# Patient Record
Sex: Female | Born: 1990
Health system: Southern US, Community
[De-identification: ages and names within clinical notes are randomized; demographics above are authoritative.]

## PROBLEM LIST (undated history)

## (undated) ENCOUNTER — Ambulatory Visit (HOSPITAL_COMMUNITY): Admission: EM | Payer: BLUE CROSS/BLUE SHIELD | Source: Home / Self Care

## (undated) ENCOUNTER — Inpatient Hospital Stay (HOSPITAL_COMMUNITY): Payer: Self-pay

## (undated) DIAGNOSIS — F419 Anxiety disorder, unspecified: Secondary | ICD-10-CM

## (undated) DIAGNOSIS — IMO0002 Reserved for concepts with insufficient information to code with codable children: Secondary | ICD-10-CM

## (undated) DIAGNOSIS — R87629 Unspecified abnormal cytological findings in specimens from vagina: Secondary | ICD-10-CM

## (undated) DIAGNOSIS — D649 Anemia, unspecified: Secondary | ICD-10-CM

## (undated) DIAGNOSIS — R569 Unspecified convulsions: Secondary | ICD-10-CM

## (undated) DIAGNOSIS — E7030 Albinism, unspecified: Secondary | ICD-10-CM

## (undated) DIAGNOSIS — I4891 Unspecified atrial fibrillation: Secondary | ICD-10-CM

## (undated) DIAGNOSIS — R011 Cardiac murmur, unspecified: Secondary | ICD-10-CM

## (undated) DIAGNOSIS — R87619 Unspecified abnormal cytological findings in specimens from cervix uteri: Secondary | ICD-10-CM

## (undated) DIAGNOSIS — Z3490 Encounter for supervision of normal pregnancy, unspecified, unspecified trimester: Secondary | ICD-10-CM

## (undated) HISTORY — PX: TONSILLECTOMY: SUR1361

## (undated) HISTORY — DX: Unspecified abnormal cytological findings in specimens from vagina: R87.629

## (undated) HISTORY — DX: Albinism, unspecified: E70.30

## (undated) HISTORY — DX: Reserved for concepts with insufficient information to code with codable children: IMO0002

## (undated) HISTORY — DX: Anxiety disorder, unspecified: F41.9

## (undated) HISTORY — DX: Encounter for supervision of normal pregnancy, unspecified, unspecified trimester: Z34.90

## (undated) HISTORY — PX: WISDOM TOOTH EXTRACTION: SHX21

## (undated) HISTORY — DX: Unspecified convulsions: R56.9

## (undated) HISTORY — DX: Unspecified atrial fibrillation: I48.91

## (undated) HISTORY — DX: Unspecified abnormal cytological findings in specimens from cervix uteri: R87.619

## (undated) HISTORY — DX: Anemia, unspecified: D64.9

## (undated) HISTORY — DX: Cardiac murmur, unspecified: R01.1

---
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
## 2012-01-10 ENCOUNTER — Other Ambulatory Visit: Payer: Self-pay | Admitting: Family Medicine

## 2012-01-10 DIAGNOSIS — N6320 Unspecified lump in the left breast, unspecified quadrant: Secondary | ICD-10-CM

## 2012-01-18 ENCOUNTER — Ambulatory Visit (HOSPITAL_COMMUNITY)
Admission: RE | Admit: 2012-01-18 | Discharge: 2012-01-18 | Disposition: A | Discharge status: Home / Self Care | Payer: Medicaid Other | Source: Ambulatory Visit | Attending: Family Medicine | Admitting: Family Medicine

## 2012-01-18 DIAGNOSIS — N6320 Unspecified lump in the left breast, unspecified quadrant: Secondary | ICD-10-CM

## 2012-01-18 DIAGNOSIS — N63 Unspecified lump in unspecified breast: Secondary | ICD-10-CM | POA: Insufficient documentation

## 2012-01-23 NOTE — L&D Delivery Note (Signed)
Attestation of Attending Supervision of Advanced Practitioner (CNM/NP): Evaluation and management procedures were performed by the Advanced Practitioner under my supervision and collaboration.  I have reviewed the Advanced Practitioner's note and chart, and I agree with the management and plan.  Jacole Capley 12/31/2012 8:50 AM   

## 2012-01-23 NOTE — L&D Delivery Note (Signed)
Delivery Note At 11:05 PM a viable female was delivered via Vaginal, Spontaneous Delivery (Presentation: Left Occiput Anterior).  APGAR: 9, 9; weight   pending Placenta status: Intact, Spontaneous.  Cord: 3 vessels with the following complications: None.    Anesthesia: Epidural  Episiotomy: None Lacerations: None Suture Repair: n/a Est. Blood Loss (mL): 100  Mom to postpartum.  Baby to Couplet care / Skin to Skin.  CRESENZO-DISHMAN,Abhiram Criado 12/26/2012, 1:50 AM

## 2012-06-25 ENCOUNTER — Other Ambulatory Visit: Payer: Self-pay | Admitting: Obstetrics & Gynecology

## 2012-06-25 DIAGNOSIS — O3680X0 Pregnancy with inconclusive fetal viability, not applicable or unspecified: Secondary | ICD-10-CM

## 2012-06-26 ENCOUNTER — Other Ambulatory Visit: Payer: Self-pay | Admitting: Nurse Practitioner

## 2012-06-26 DIAGNOSIS — N6320 Unspecified lump in the left breast, unspecified quadrant: Secondary | ICD-10-CM

## 2012-07-01 ENCOUNTER — Other Ambulatory Visit: Payer: Self-pay | Admitting: Obstetrics & Gynecology

## 2012-07-01 ENCOUNTER — Ambulatory Visit (INDEPENDENT_AMBULATORY_CARE_PROVIDER_SITE_OTHER): Payer: Medicaid Other

## 2012-07-01 DIAGNOSIS — Z36 Encounter for antenatal screening of mother: Secondary | ICD-10-CM

## 2012-07-01 DIAGNOSIS — O3680X0 Pregnancy with inconclusive fetal viability, not applicable or unspecified: Secondary | ICD-10-CM

## 2012-07-02 ENCOUNTER — Ambulatory Visit (HOSPITAL_COMMUNITY)
Admission: RE | Admit: 2012-07-02 | Discharge: 2012-07-02 | Disposition: A | Discharge status: Home / Self Care | Payer: Medicaid Other | Source: Ambulatory Visit | Attending: Nurse Practitioner | Admitting: Nurse Practitioner

## 2012-07-02 DIAGNOSIS — Z09 Encounter for follow-up examination after completed treatment for conditions other than malignant neoplasm: Secondary | ICD-10-CM | POA: Insufficient documentation

## 2012-07-02 DIAGNOSIS — N6320 Unspecified lump in the left breast, unspecified quadrant: Secondary | ICD-10-CM

## 2012-07-02 DIAGNOSIS — N63 Unspecified lump in unspecified breast: Secondary | ICD-10-CM | POA: Insufficient documentation

## 2012-07-07 ENCOUNTER — Ambulatory Visit (INDEPENDENT_AMBULATORY_CARE_PROVIDER_SITE_OTHER): Payer: Medicaid Other | Admitting: Adult Health

## 2012-07-07 ENCOUNTER — Emergency Department (HOSPITAL_COMMUNITY)
Admission: EM | Admit: 2012-07-07 | Discharge: 2012-07-08 | Disposition: A | Discharge status: Home / Self Care | Payer: Medicaid Other | Attending: Emergency Medicine | Admitting: Emergency Medicine

## 2012-07-07 ENCOUNTER — Encounter (HOSPITAL_COMMUNITY): Payer: Self-pay | Admitting: *Deleted

## 2012-07-07 ENCOUNTER — Encounter: Payer: Self-pay | Admitting: Adult Health

## 2012-07-07 VITALS — BP 110/66 | Ht 67.0 in | Wt 211.0 lb

## 2012-07-07 DIAGNOSIS — Z1389 Encounter for screening for other disorder: Secondary | ICD-10-CM

## 2012-07-07 DIAGNOSIS — Z331 Pregnant state, incidental: Secondary | ICD-10-CM

## 2012-07-07 DIAGNOSIS — Z8639 Personal history of other endocrine, nutritional and metabolic disease: Secondary | ICD-10-CM | POA: Insufficient documentation

## 2012-07-07 DIAGNOSIS — Y9389 Activity, other specified: Secondary | ICD-10-CM | POA: Insufficient documentation

## 2012-07-07 DIAGNOSIS — Z862 Personal history of diseases of the blood and blood-forming organs and certain disorders involving the immune mechanism: Secondary | ICD-10-CM | POA: Insufficient documentation

## 2012-07-07 DIAGNOSIS — L089 Local infection of the skin and subcutaneous tissue, unspecified: Secondary | ICD-10-CM | POA: Insufficient documentation

## 2012-07-07 DIAGNOSIS — Y929 Unspecified place or not applicable: Secondary | ICD-10-CM | POA: Insufficient documentation

## 2012-07-07 DIAGNOSIS — Z3490 Encounter for supervision of normal pregnancy, unspecified, unspecified trimester: Secondary | ICD-10-CM

## 2012-07-07 DIAGNOSIS — O9989 Other specified diseases and conditions complicating pregnancy, childbirth and the puerperium: Secondary | ICD-10-CM | POA: Insufficient documentation

## 2012-07-07 DIAGNOSIS — R011 Cardiac murmur, unspecified: Secondary | ICD-10-CM | POA: Insufficient documentation

## 2012-07-07 DIAGNOSIS — Z3401 Encounter for supervision of normal first pregnancy, first trimester: Secondary | ICD-10-CM

## 2012-07-07 DIAGNOSIS — O99019 Anemia complicating pregnancy, unspecified trimester: Secondary | ICD-10-CM

## 2012-07-07 DIAGNOSIS — Z8669 Personal history of other diseases of the nervous system and sense organs: Secondary | ICD-10-CM | POA: Insufficient documentation

## 2012-07-07 DIAGNOSIS — Z87891 Personal history of nicotine dependence: Secondary | ICD-10-CM | POA: Insufficient documentation

## 2012-07-07 DIAGNOSIS — W57XXXA Bitten or stung by nonvenomous insect and other nonvenomous arthropods, initial encounter: Secondary | ICD-10-CM

## 2012-07-07 HISTORY — DX: Encounter for supervision of normal pregnancy, unspecified, unspecified trimester: Z34.90

## 2012-07-07 LAB — CBC
HCT: 34.8 % — ABNORMAL LOW (ref 36.0–46.0)
Hemoglobin: 11.9 g/dL — ABNORMAL LOW (ref 12.0–15.0)
MCH: 29.5 pg (ref 26.0–34.0)
MCHC: 34.2 g/dL (ref 30.0–36.0)
MCV: 86.1 fL (ref 78.0–100.0)
Platelets: 216 K/uL (ref 150–400)
RBC: 4.04 MIL/uL (ref 3.87–5.11)
RDW: 14.1 % (ref 11.5–15.5)
WBC: 6.4 K/uL (ref 4.0–10.5)

## 2012-07-07 LAB — POCT URINALYSIS DIPSTICK
Blood, UA: NEGATIVE
Glucose, UA: NEGATIVE
Ketones, UA: NEGATIVE
Leukocytes, UA: NEGATIVE
Nitrite, UA: NEGATIVE

## 2012-07-07 LAB — HIV ANTIBODY (ROUTINE TESTING W REFLEX): HIV: NONREACTIVE

## 2012-07-07 LAB — RPR

## 2012-07-07 LAB — OB RESULTS CONSOLE GC/CHLAMYDIA
Chlamydia: NEGATIVE
Gonorrhea: NEGATIVE

## 2012-07-07 LAB — OB RESULTS CONSOLE HIV ANTIBODY (ROUTINE TESTING): HIV: NONREACTIVE

## 2012-07-07 LAB — CYSTIC FIBROSIS DIAGNOSTIC STUDY: Interpretation-CFDNA:: NEGATIVE

## 2012-07-07 MED ORDER — CEPHALEXIN 500 MG PO CAPS: 500.0000 mg | ORAL_CAPSULE | Freq: Three times a day (TID) | ORAL | Status: DC

## 2012-07-07 NOTE — ED Notes (Signed)
Pt reporting insect bite on right side of abdomen and right forearm. Areas red, swollen and tender.

## 2012-07-07 NOTE — ED Provider Notes (Signed)
History     CSN: [id number]  Arrival date & time 07/07/12  2333   First MD Initiated Contact with Patient 07/07/12 2337      No chief complaint on file.   (Consider location/radiation/quality/duration/timing/severity/associated sxs/prior treatment) HPI Kristi West is a 22 y.o. female who presents to the ED with insect bites to her right forearm and right side of her abdomen. The bites occurred 3 days ago and started as a small raised area with itching. Now the areas have redness and warmth. The patient is 3 months pregnant.  The history was provided by the patient.   Past Medical History  Diagnosis Date  . Anemia   . Seizures   . Albinism   . Heart murmur   . Abnormal Pap smear   . Pregnant 07/07/2012    Past Surgical History  Procedure Laterality Date  . Tonsillectomy    . Wisdom tooth extraction      Family History  Problem Relation Age of Onset  . Cancer Maternal Aunt     stomach  . Cancer Maternal Grandmother     leukemia  . Diabetes Paternal Grandmother   . Stroke Paternal Grandfather   . SIDS Brother     History  Substance Use Topics  . Smoking status: Former Smoker -- .5 years    Types: Cigarettes  . Smokeless tobacco: Never Used  . Alcohol Use: No     Comment: occa    OB History   Grav Para Term Preterm Abortions TAB SAB Ect Mult Living   1               Review of Systems  Constitutional: Negative for fever and chills.  HENT: Negative for neck pain.   Respiratory: Negative for shortness of breath and wheezing.   Cardiovascular: Negative for chest pain.  Gastrointestinal: Negative for nausea, vomiting and abdominal pain.  Genitourinary: Negative for vaginal bleeding.       Pregnant  Musculoskeletal: Negative for back pain.  Skin: Positive for wound.  Neurological: Negative for dizziness and headaches.  Psychiatric/Behavioral: The patient is not nervous/anxious.     Allergies  Ceclor  Home Medications   Current Outpatient Rx  Name   Route  Sig  Dispense  Refill  . Prenatal Vit-Fe Fumarate-FA (PRENATAL COMPLETE PO)   Oral   Take 1 each by mouth daily.           LMP 04/06/2012 BP 129/79  Pulse 89  Temp(Src) 98.1 F (36.7 C) (Oral)  Resp 20  Ht 5' 7.5" (1.715 m)  Wt 211 lb (95.709 kg)  BMI 32.54 kg/m2  SpO2 99%  LMP 04/06/2012  Physical Exam  Nursing note and vitals reviewed. Constitutional: She is oriented to person, place, and time. She appears well-developed and well-nourished.  HENT:  Head: Normocephalic.  Eyes: EOM are normal.  Neck: Neck supple.  Cardiovascular: Normal rate.   Pulmonary/Chest: Effort normal.  Musculoskeletal:       Right forearm: She exhibits tenderness and swelling.       Arms: Neurological: She is alert and oriented to person, place, and time. No cranial nerve deficit.  Skin:  There is a raised macular papular area on the right forearm palmar aspect with erythema surrounding. There is a red raised area noted on the right side of the abdomen. Consistent with insect bites with infection of the one on the arm.  Psychiatric: She has a normal mood and affect. Her behavior is normal.  ED Course  Procedures (including critical care time)   MDM  22 y.o. female with infected insect bite to right forearm. She is [redacted] weeks pregnant and wants to be sure medication is safe in pregnancy. She states that when she was an infant she got a rash while taking Ceclor but since then has had no problems with Penicillin and Amoxicillin. Will treat with Keflex and Benadryl.  Discussed with the patient clinical findings and plan of care. All questioned fully answered. She will return if any problems arise.    Medication List    TAKE these medications       cephALEXin 500 MG capsule  Commonly known as:  KEFLEX  Take 1 capsule (500 mg total) by mouth 3 (three) times daily.      ASK your doctor about these medications       PRENATAL COMPLETE PO  Take 1 each by mouth daily.                Kristi West, [place] 07/07/12 [phone]

## 2012-07-07 NOTE — Progress Notes (Signed)
Pt here today for NEW OB visit. Pt denies any bleeding or cramping. Pt states she has  Noticed a white discharge. Pt denies any other issues at this time. Pt given CCNC form and lab consents to read over and sign.

## 2012-07-07 NOTE — Patient Instructions (Addendum)

## 2012-07-07 NOTE — Progress Notes (Signed)
Kristi West is a 22 year old black female in for a new ob visit.Denies and bleeding or discharge today, no cramping.No nausea now.FHR 141, she has insect right arm and abdomen that is red and itches.ok to use benadryl  cream or po  Call if changes in appearance. Skin warm and dry. Neck: mid line trachea, normal thyroid. Lungs: clear to ausculation bilaterally. Cardiovascular: regular rate and rhythm.She has bilateral nystagmus.Breast deferred. Will request prior pap.In college at [employer], will transfer care there in fall.Will see back in 3 weeks for 2nd IT draw and will for prenatal 1 labs today.[place] 08/18/12.

## 2012-07-08 LAB — DRUG SCREEN, URINE, NO CONFIRMATION
Amphetamine Screen, Ur: NEGATIVE
Benzodiazepines.: NEGATIVE
Cocaine Metabolites: NEGATIVE
Marijuana Metabolite: NEGATIVE
Opiate Screen, Urine: NEGATIVE
Phencyclidine (PCP): NEGATIVE

## 2012-07-08 LAB — GC/CHLAMYDIA PROBE AMP
CT Probe RNA: NEGATIVE
GC Probe RNA: NEGATIVE

## 2012-07-08 LAB — ABO AND RH: Rh Type: POSITIVE

## 2012-07-08 NOTE — ED Provider Notes (Signed)
Medical screening examination/treatment/procedure(s) were performed by non-physician practitioner and as supervising physician I was immediately available for consultation/collaboration.  Ajai Harville S. Duyen Beckom, MD 07/08/12 0225 

## 2012-07-09 LAB — CYSTIC FIBROSIS DIAGNOSTIC STUDY

## 2012-07-10 LAB — VARICELLA ZOSTER ANTIBODY, IGG: Varicella IgG: 2775 Index — ABNORMAL HIGH (ref <135.00)

## 2012-07-10 LAB — RUBELLA SCREEN: Rubella: 7.09 Index — ABNORMAL HIGH (ref <0.90)

## 2012-07-28 ENCOUNTER — Encounter: Payer: Self-pay | Admitting: Obstetrics & Gynecology

## 2012-07-28 ENCOUNTER — Ambulatory Visit (INDEPENDENT_AMBULATORY_CARE_PROVIDER_SITE_OTHER): Payer: Medicaid Other | Admitting: Obstetrics & Gynecology

## 2012-07-28 VITALS — BP 120/70 | Wt 215.0 lb

## 2012-07-28 DIAGNOSIS — O99019 Anemia complicating pregnancy, unspecified trimester: Secondary | ICD-10-CM

## 2012-07-28 DIAGNOSIS — Z3402 Encounter for supervision of normal first pregnancy, second trimester: Secondary | ICD-10-CM

## 2012-07-28 DIAGNOSIS — Z1389 Encounter for screening for other disorder: Secondary | ICD-10-CM

## 2012-07-28 DIAGNOSIS — Z331 Pregnant state, incidental: Secondary | ICD-10-CM

## 2012-07-28 DIAGNOSIS — H5500 Unspecified nystagmus: Secondary | ICD-10-CM

## 2012-07-28 LAB — POCT URINALYSIS DIPSTICK
Blood, UA: NEGATIVE
Glucose, UA: NEGATIVE
Nitrite, UA: NEGATIVE

## 2012-07-28 NOTE — Progress Notes (Signed)
No complaints no bleeding Sonogram 3 weeks from now

## 2012-07-28 NOTE — Addendum Note (Signed)
Addended by: Richardson Chiquito on: 07/28/2012 01:40 PM   Modules accepted: Orders

## 2012-07-28 NOTE — Patient Instructions (Signed)
How a Baby Grows During Pregnancy Pregnancy begins when the female's sperm enters the female's egg. This happens in the fallopian tube and is called fertilization. The fertilized egg is called an embryo until it reaches 9 weeks from the time of fertilization. From 9 weeks until birth it is called a fetus. The fertilized egg moves down the tube into the uterus and attaches to the inside lining of the uterus.  The pregnant woman is responsible for the growth of the embryo/fetus by supplying nourishment and oxygen through the blood stream and placenta to the developing fetus. The uterus becomes larger and pops out from the abdomen more and more as the fetus develops and grows. A normal pregnancy lasts 280 days, with a range of 259 to 294 days, or 40 weeks. The pregnancy is divided up into three trimesters:  First trimester - 0 to 13 weeks.  Second trimester - 14 to 27 weeks.  Third trimester - 28 to 40 weeks. The day your baby is supposed to be born is called estimated date of confinement (EDC) or estimated date of delivery (EDD). GROWTH OF THE BABY MONTH BY MONTH 1. First Month: The fertilized egg attaches to the inside of the uterus and certain cells will form the placenta and others will develop into the fetus. The arms, legs, brain, spinal cord, lungs, and heart begin to develop. At the end of the first month the heart begins to beat. The embryo weighs less than an ounce and is  inch long. 2. Second Month: The bones can be seen, the inner ear, eye lids, hands and feet form and genitals develop. By the end of 8 weeks, all of the major organs are developing. The fetus now weighs less than an ounce and is one inch (2.54 cm) long. 3. Third Month: Teeth buds appear, all the internal organs are forming, bones and muscles begin to grow, the spine can flex and the skin is transparent. Finger and toe nails begin to form, the hands develop faster than the feet and the arms are longer than the legs at this point.  The fetus weighs a little more than an ounce (0.03 kg) and is 3 inches (8.89cm) long. 4. Fourth Month: The placenta is completely formed. The external sex organs, neck, outer ear, eyebrows, eyelids and fingernails are formed. The fetus can hear, swallow, flex its arms and legs and the kidney begins to produce urine. The skin is covered with a white waxy coating (vernix) and very thin hair (lanugo) is present. The fetus weighs 5 ounces (0.14kg) and is 6 to 7 inches (16.51cm) long. 5. Fifth Month: The fetus moves around more and can be felt for the first time (called quickening), sleeps and wakes up at times, may begin to suck its finger and the nails grow to the end of the fingers. The gallbladder is now functioning and helps to digest the nutrients, eggs are formed in the female and the testicles begin to drop down from the abdomen to the scrotum in the female. The fetus weighs  to 1 pound (0.45kg) and is 10 inches (25.4cm) long. 6. Sixth Month: The lungs are formed but the fetus does not breath yet. The eyes open, the brain develops more quickly at this time, one can detect finger and toe prints and thicker hair grows. The fetus weighs 1 to 1 pounds (0.68kg) and is 12 inches (30.48cm) long. 7. Seventh Month: The fetus can hear and respond to sounds, kicks and stretches and can sense   changes in light. The fetus weighs 2 to 2 pounds (1.13kg) and is 14 inches (35.56cm) long. 8. Eight Month: All organs and body systems are fully developed and functioning. The bones get harder, taste buds develop and can taste sweet and sour flavors and the fetus may hiccup now. Different parts of the brain are developing and the skull remains soft for the brain to grow. The fetus weighs 5 pounds (2.27kg) and is 18 inches (45.75cm) long. 9. Ninth Month: The fetus gains about a half a pound a week, the lungs are fully developed, patterns of sleep develop and the head moves down into the bottom of the uterus called vertex. If the  buttocks moves into the bottom of the uterus, it is called a breech. The fetus weighs 6 to 9 pounds (2.72 to 4.08kg) and is 20 inches (50.8cm) long. You should be informed about your pregnancy, yourself and how the baby is developing as much as possible. Being informed helps you to enjoy this experience. It also gives you the sense to feel if something is not going right and when to ask questions. Talk to your caregiver when you have questions about your baby or your own body. Document Released: 06/27/2007 Document Revised: 04/02/2011 Document Reviewed: 06/27/2007 [employer] Patient Information 2014 [employer].  

## 2012-08-14 ENCOUNTER — Other Ambulatory Visit: Payer: Self-pay | Admitting: Obstetrics & Gynecology

## 2012-08-15 ENCOUNTER — Other Ambulatory Visit: Payer: Self-pay | Admitting: Obstetrics & Gynecology

## 2012-08-15 DIAGNOSIS — Z1389 Encounter for screening for other disorder: Secondary | ICD-10-CM

## 2012-08-18 ENCOUNTER — Ambulatory Visit (INDEPENDENT_AMBULATORY_CARE_PROVIDER_SITE_OTHER): Payer: Medicaid Other

## 2012-08-18 ENCOUNTER — Encounter: Payer: Self-pay | Admitting: Women's Health

## 2012-08-18 ENCOUNTER — Other Ambulatory Visit: Payer: Self-pay | Admitting: Women's Health

## 2012-08-18 ENCOUNTER — Ambulatory Visit (INDEPENDENT_AMBULATORY_CARE_PROVIDER_SITE_OTHER): Payer: Medicaid Other | Admitting: Women's Health

## 2012-08-18 VITALS — BP 110/70 | Wt 219.0 lb

## 2012-08-18 DIAGNOSIS — Z1389 Encounter for screening for other disorder: Secondary | ICD-10-CM

## 2012-08-18 DIAGNOSIS — O99019 Anemia complicating pregnancy, unspecified trimester: Secondary | ICD-10-CM

## 2012-08-18 DIAGNOSIS — Z331 Pregnant state, incidental: Secondary | ICD-10-CM

## 2012-08-18 DIAGNOSIS — Z3400 Encounter for supervision of normal first pregnancy, unspecified trimester: Secondary | ICD-10-CM | POA: Insufficient documentation

## 2012-08-18 DIAGNOSIS — Z3402 Encounter for supervision of normal first pregnancy, second trimester: Secondary | ICD-10-CM

## 2012-08-18 LAB — POCT URINALYSIS DIPSTICK
Blood, UA: NEGATIVE
Glucose, UA: NEGATIVE
Nitrite, UA: NEGATIVE
Protein, UA: NEGATIVE

## 2012-08-18 NOTE — Progress Notes (Signed)
TODAY 2 IT.

## 2012-08-18 NOTE — Progress Notes (Signed)
Reports good fm. Denies uc's, lof, vb, urinary frequency, urgency, hesitancy, or dysuria.  No complaints.  Reviewed warning s/s to report.  All questions answered. Moving to [facility] for school, ext visit will be there in 4wks. Plans to move back here around Dec 8 and deliver here.

## 2012-08-18 NOTE — Progress Notes (Signed)
Anatomy Screen complete, all anat appears nml, meas. C/w dates, cx closed 3.6 cm, fluid nml, FHT 146/bpm, post high plac., gr 0,  female fetus, active, bilat ovs seen

## 2012-08-18 NOTE — Patient Instructions (Signed)
Pregnancy - Second Trimester The second trimester of pregnancy (3 to 6 months) is a period of rapid growth for you and your baby. At the end of the sixth month, your baby is about 9 inches long and weighs 1 1/2 pounds. You will begin to feel the baby move between 18 and 20 weeks of the pregnancy. This is called quickening. Weight gain is faster. A clear fluid (colostrum) may leak out of your breasts. You may feel small contractions of the womb (uterus). This is known as false labor or Braxton-Hicks contractions. This is like a practice for labor when the baby is ready to be born. Usually, the problems with morning sickness have usually passed by the end of your first trimester. Some women develop small dark blotches (called cholasma, mask of pregnancy) on their face that usually goes away after the baby is born. Exposure to the sun makes the blotches worse. Acne may also develop in some pregnant women and pregnant women who have acne, may find that it goes away. PRENATAL EXAMS  Blood work may continue to be done during prenatal exams. These tests are done to check on your health and the probable health of your baby. Blood work is used to follow your blood levels (hemoglobin). Anemia (low hemoglobin) is common during pregnancy. Iron and vitamins are given to help prevent this. You will also be checked for diabetes between 24 and 28 weeks of the pregnancy. Some of the previous blood tests may be repeated.  The size of the uterus is measured during each visit. This is to make sure that the baby is continuing to grow properly according to the dates of the pregnancy.  Your blood pressure is checked every prenatal visit. This is to make sure you are not getting toxemia.  Your urine is checked to make sure you do not have an infection, diabetes or protein in the urine.  Your weight is checked often to make sure gains are happening at the suggested rate. This is to ensure that both you and your baby are  growing normally.  Sometimes, an ultrasound is performed to confirm the proper growth and development of the baby. This is a test which bounces harmless sound waves off the baby so your caregiver can more accurately determine due dates. Sometimes, a test is done on the amniotic fluid surrounding the baby. This test is called an amniocentesis. The amniotic fluid is obtained by sticking a needle into the belly (abdomen). This is done to check the chromosomes in instances where there is a concern about possible genetic problems with the baby. It is also sometimes done near the end of pregnancy if an early delivery is required. In this case, it is done to help make sure the baby's lungs are mature enough for the baby to live outside of the womb. CHANGES OCCURING IN THE SECOND TRIMESTER OF PREGNANCY Your body goes through many changes during pregnancy. They vary from person to person. Talk to your caregiver about changes you notice that you are concerned about.  During the second trimester, you will likely have an increase in your appetite. It is normal to have cravings for certain foods. This varies from person to person and pregnancy to pregnancy.  Your lower abdomen will begin to bulge.  You may have to urinate more often because the uterus and baby are pressing on your bladder. It is also common to get more bladder infections during pregnancy. You can help this by drinking lots of fluids   and emptying your bladder before and after intercourse.  You may begin to get stretch marks on your hips, abdomen, and breasts. These are normal changes in the body during pregnancy. There are no exercises or medicines to take that prevent this change.  You may begin to develop swollen and bulging veins (varicose veins) in your legs. Wearing support hose, elevating your feet for 15 minutes, 3 to 4 times a day and limiting salt in your diet helps lessen the problem.  Heartburn may develop as the uterus grows and  pushes up against the stomach. Antacids recommended by your caregiver helps with this problem. Also, eating smaller meals 4 to 5 times a day helps.  Constipation can be treated with a stool softener or adding bulk to your diet. Drinking lots of fluids, and eating vegetables, fruits, and whole grains are helpful.  Exercising is also helpful. If you have been very active up until your pregnancy, most of these activities can be continued during your pregnancy. If you have been less active, it is helpful to start an exercise program such as walking.  Hemorrhoids may develop at the end of the second trimester. Warm sitz baths and hemorrhoid cream recommended by your caregiver helps hemorrhoid problems.  Backaches may develop during this time of your pregnancy. Avoid heavy lifting, wear low heal shoes, and practice good posture to help with backache problems.  Some pregnant women develop tingling and numbness of their hand and fingers because of swelling and tightening of ligaments in the wrist (carpel tunnel syndrome). This goes away after the baby is born.  As your breasts enlarge, you may have to get a bigger bra. Get a comfortable, cotton, support bra. Do not get a nursing bra until the last month of the pregnancy if you will be nursing the baby.  You may get a dark line from your belly button to the pubic area called the linea nigra.  You may develop rosy cheeks because of increase blood flow to the face.  You may develop spider looking lines of the face, neck, arms, and chest. These go away after the baby is born. HOME CARE INSTRUCTIONS   It is extremely important to avoid all smoking, herbs, alcohol, and unprescribed drugs during your pregnancy. These chemicals affect the formation and growth of the baby. Avoid these chemicals throughout the pregnancy to ensure the delivery of a healthy infant.  Most of your home care instructions are the same as suggested for the first trimester of your  pregnancy. Keep your caregiver's appointments. Follow your caregiver's instructions regarding medicine use, exercise, and diet.  During pregnancy, you are providing food for you and your baby. Continue to eat regular, well-balanced meals. Choose foods such as meat, fish, milk and other low fat dairy products, vegetables, fruits, and whole-grain breads and cereals. Your caregiver will tell you of the ideal weight gain.  A physical sexual relationship may be continued up until near the end of pregnancy if there are no other problems. Problems could include early (premature) leaking of amniotic fluid from the membranes, vaginal bleeding, abdominal pain, or other medical or pregnancy problems.  Exercise regularly if there are no restrictions. Check with your caregiver if you are unsure of the safety of some of your exercises. The greatest weight gain will occur in the last 2 trimesters of pregnancy. Exercise will help you:  Control your weight.  Get you in shape for labor and delivery.  Lose weight after you have the baby.  Wear   a good support or jogging bra for breast tenderness during pregnancy. This may help if worn during sleep. Pads or tissues may be used in the bra if you are leaking colostrum.  Do not use hot tubs, steam rooms or saunas throughout the pregnancy.  Wear your seat belt at all times when driving. This protects you and your baby if you are in an accident.  Avoid raw meat, uncooked cheese, cat litter boxes, and soil used by cats. These carry germs that can cause birth defects in the baby.  The second trimester is also a good time to visit your dentist for your dental health if this has not been done yet. Getting your teeth cleaned is okay. Use a soft toothbrush. Brush gently during pregnancy.  It is easier to leak urine during pregnancy. Tightening up and strengthening the pelvic muscles will help with this problem. Practice stopping your urination while you are going to the  bathroom. These are the same muscles you need to strengthen. It is also the muscles you would use as if you were trying to stop from passing gas. You can practice tightening these muscles up 10 times a set and repeating this about 3 times per day. Once you know what muscles to tighten up, do not perform these exercises during urination. It is more likely to contribute to an infection by backing up the urine.  Ask for help if you have financial, counseling, or nutritional needs during pregnancy. Your caregiver will be able to offer counseling for these needs as well as refer you for other special needs.  Your skin may become oily. If so, wash your face with mild soap, use non-greasy moisturizer and oil or cream based makeup. MEDICINES AND DRUG USE IN PREGNANCY  Take prenatal vitamins as directed. The vitamin should contain 1 milligram of folic acid. Keep all vitamins out of reach of children. Only a couple vitamins or tablets containing iron may be fatal to a baby or young child when ingested.  Avoid use of all medicines, including herbs, over-the-counter medicines, not prescribed or suggested by your caregiver. Only take over-the-counter or prescription medicines for pain, discomfort, or fever as directed by your caregiver. Do not use aspirin.  Let your caregiver also know about herbs you may be using.  Alcohol is related to a number of birth defects. This includes fetal alcohol syndrome. All alcohol, in any form, should be avoided completely. Smoking will cause low birth rate and premature babies.  Street or illegal drugs are very harmful to the baby. They are absolutely forbidden. A baby born to an addicted mother will be addicted at birth. The baby will go through the same withdrawal an adult does. SEEK MEDICAL CARE IF:  You have any concerns or worries during your pregnancy. It is better to call with your questions if you feel they cannot wait, rather than worry about them. SEEK IMMEDIATE  MEDICAL CARE IF:   An unexplained oral temperature above 102 F (38.9 C) develops, or as your caregiver suggests.  You have leaking of fluid from the vagina (birth canal). If leaking membranes are suspected, take your temperature and tell your caregiver of this when you call.  There is vaginal spotting, bleeding, or passing clots. Tell your caregiver of the amount and how many pads are used. Light spotting in pregnancy is common, especially following intercourse.  You develop a bad smelling vaginal discharge with a change in the color from clear to white.  You continue to feel   sick to your stomach (nauseated) and have no relief from remedies suggested. You vomit blood or coffee ground-like materials.  You lose more than 2 pounds of weight or gain more than 2 pounds of weight over 1 week, or as suggested by your caregiver.  You notice swelling of your face, hands, feet, or legs.  You get exposed to German measles and have never had them.  You are exposed to fifth disease or chickenpox.  You develop belly (abdominal) pain. Round ligament discomfort is a common non-cancerous (benign) cause of abdominal pain in pregnancy. Your caregiver still must evaluate you.  You develop a bad headache that does not go away.  You develop fever, diarrhea, pain with urination, or shortness of breath.  You develop visual problems, blurry, or double vision.  You fall or are in a car accident or any kind of trauma.  There is mental or physical violence at home. Document Released: 01/02/2001 Document Revised: 10/03/2011 Document Reviewed: 07/07/2008 [employer] Patient Information 2014 [employer].  

## 2012-08-19 LAB — URINALYSIS
Bilirubin Urine: NEGATIVE
Glucose, UA: NEGATIVE mg/dL
Hgb urine dipstick: NEGATIVE
Protein, ur: NEGATIVE mg/dL
Urobilinogen, UA: 1 mg/dL (ref 0.0–1.0)

## 2012-08-22 LAB — MATERNAL SCREEN, INTEGRATED #2
AFP, Serum: 48 ng/mL
Age risk Down Syndrome: 1:1100 {titer}
Calculated Gestational Age: 19.6
Inhibin A Dimeric: 107 pg/mL
Inhibin A MoM: 0.66
MSS Trisomy 18 Risk: <1:5000 {titer}
NT MoM: 1.09
Number of fetuses: 1
PAPP-A MoM: 0.94
PAPP-A: 927 ng/mL
Rish for ONTD: <1:5000 {titer}

## 2012-10-08 ENCOUNTER — Encounter: Payer: Self-pay | Admitting: Obstetrics and Gynecology

## 2012-10-08 NOTE — Progress Notes (Signed)
Patient ID: Kristi West, female   DOB: 1990-10-16, 22 y.o.   MRN: [id number] We sent records to [facility] but they say they do not have records they sent the same release again today so I printed records from chart review and faxed again/AMP/10-08-2012

## 2012-10-28 LAB — OB RESULTS CONSOLE RPR: RPR: NONREACTIVE

## 2012-11-27 ENCOUNTER — Other Ambulatory Visit: Payer: Self-pay

## 2012-12-24 ENCOUNTER — Encounter: Payer: Self-pay | Admitting: *Deleted

## 2012-12-25 ENCOUNTER — Inpatient Hospital Stay (HOSPITAL_COMMUNITY)
Admission: AD | Admit: 2012-12-25 | Discharge: 2012-12-27 | DRG: 775 | Disposition: A | Discharge status: Home / Self Care | Payer: Medicaid Other | Source: Ambulatory Visit | Attending: Obstetrics and Gynecology | Admitting: Obstetrics and Gynecology

## 2012-12-25 ENCOUNTER — Encounter: Payer: Self-pay | Admitting: Advanced Practice Midwife

## 2012-12-25 ENCOUNTER — Encounter (HOSPITAL_COMMUNITY): Payer: Self-pay | Admitting: *Deleted

## 2012-12-25 ENCOUNTER — Telehealth: Payer: Self-pay | Admitting: Advanced Practice Midwife

## 2012-12-25 ENCOUNTER — Ambulatory Visit (INDEPENDENT_AMBULATORY_CARE_PROVIDER_SITE_OTHER): Payer: Medicaid Other | Admitting: Advanced Practice Midwife

## 2012-12-25 ENCOUNTER — Inpatient Hospital Stay (HOSPITAL_COMMUNITY): Payer: Medicaid Other | Admitting: Anesthesiology

## 2012-12-25 ENCOUNTER — Encounter (HOSPITAL_COMMUNITY): Payer: Medicaid Other | Admitting: Anesthesiology

## 2012-12-25 ENCOUNTER — Other Ambulatory Visit: Payer: Self-pay | Admitting: Advanced Practice Midwife

## 2012-12-25 VITALS — BP 122/72 | Wt 234.6 lb

## 2012-12-25 DIAGNOSIS — Z3400 Encounter for supervision of normal first pregnancy, unspecified trimester: Secondary | ICD-10-CM

## 2012-12-25 DIAGNOSIS — Z1389 Encounter for screening for other disorder: Secondary | ICD-10-CM

## 2012-12-25 DIAGNOSIS — Z331 Pregnant state, incidental: Secondary | ICD-10-CM

## 2012-12-25 DIAGNOSIS — Z3403 Encounter for supervision of normal first pregnancy, third trimester: Secondary | ICD-10-CM

## 2012-12-25 DIAGNOSIS — IMO0001 Reserved for inherently not codable concepts without codable children: Secondary | ICD-10-CM

## 2012-12-25 DIAGNOSIS — O99019 Anemia complicating pregnancy, unspecified trimester: Secondary | ICD-10-CM

## 2012-12-25 LAB — POCT URINALYSIS DIPSTICK: Nitrite, UA: NEGATIVE

## 2012-12-25 LAB — TYPE AND SCREEN
ABO/RH(D): O POS
Antibody Screen: NEGATIVE

## 2012-12-25 LAB — CBC
HCT: 32.3 % — ABNORMAL LOW (ref 36.0–46.0)
Hemoglobin: 10.6 g/dL — ABNORMAL LOW (ref 12.0–15.0)
RBC: 3.81 MIL/uL — ABNORMAL LOW (ref 3.87–5.11)
RDW: 13.6 % (ref 11.5–15.5)
WBC: 12.5 10*3/uL — ABNORMAL HIGH (ref 4.0–10.5)

## 2012-12-25 MED ORDER — LIDOCAINE HCL (PF) 1 % IJ SOLN
30.0000 mL | INTRAMUSCULAR | Status: DC | PRN
  Filled 2012-12-25 (×2): qty 30

## 2012-12-25 MED ORDER — LIDOCAINE HCL (PF) 1 % IJ SOLN
INTRAMUSCULAR | Status: DC | PRN
  Administered 2012-12-25 (×2): 9 mL

## 2012-12-25 MED ORDER — ACETAMINOPHEN 325 MG PO TABS: 650.0000 mg | ORAL_TABLET | ORAL | Status: DC | PRN

## 2012-12-25 MED ORDER — CITRIC ACID-SODIUM CITRATE 334-500 MG/5ML PO SOLN: 30.0000 mL | ORAL | Status: DC | PRN

## 2012-12-25 MED ORDER — EPHEDRINE 5 MG/ML INJ
10.0000 mg | INTRAVENOUS | Status: DC | PRN
  Filled 2012-12-25: qty 2

## 2012-12-25 MED ORDER — PHENYLEPHRINE 40 MCG/ML (10ML) SYRINGE FOR IV PUSH (FOR BLOOD PRESSURE SUPPORT)
80.0000 ug | PREFILLED_SYRINGE | INTRAVENOUS | Status: DC | PRN
  Filled 2012-12-25: qty 10
  Filled 2012-12-25: qty 2

## 2012-12-25 MED ORDER — EPHEDRINE 5 MG/ML INJ
10.0000 mg | INTRAVENOUS | Status: DC | PRN
  Filled 2012-12-25: qty 4
  Filled 2012-12-25: qty 2

## 2012-12-25 MED ORDER — FENTANYL 2.5 MCG/ML BUPIVACAINE 1/10 % EPIDURAL INFUSION (WH - ANES)
INTRAMUSCULAR | Status: DC | PRN
  Administered 2012-12-25: 14 mL/h via EPIDURAL

## 2012-12-25 MED ORDER — FENTANYL 2.5 MCG/ML BUPIVACAINE 1/10 % EPIDURAL INFUSION (WH - ANES)
14.0000 mL/h | INTRAMUSCULAR | Status: DC | PRN
  Filled 2012-12-25: qty 125

## 2012-12-25 MED ORDER — ONDANSETRON HCL 4 MG/2ML IJ SOLN: 4.0000 mg | Freq: Four times a day (QID) | INTRAMUSCULAR | Status: DC | PRN

## 2012-12-25 MED ORDER — LACTATED RINGERS IV SOLN
INTRAVENOUS | Status: DC
  Administered 2012-12-25 (×2): via INTRAVENOUS

## 2012-12-25 MED ORDER — OXYTOCIN 40 UNITS IN LACTATED RINGERS INFUSION - SIMPLE MED
62.5000 mL/h | INTRAVENOUS | Status: DC
  Administered 2012-12-25: 62.5 mL/h via INTRAVENOUS
  Filled 2012-12-25: qty 1000

## 2012-12-25 MED ORDER — FLEET ENEMA 7-19 GM/118ML RE ENEM: 1.0000 | ENEMA | RECTAL | Status: DC | PRN

## 2012-12-25 MED ORDER — DIPHENHYDRAMINE HCL 50 MG/ML IJ SOLN: 12.5000 mg | INTRAMUSCULAR | Status: DC | PRN

## 2012-12-25 MED ORDER — IBUPROFEN 600 MG PO TABS: 600.0000 mg | ORAL_TABLET | Freq: Four times a day (QID) | ORAL | Status: DC | PRN

## 2012-12-25 MED ORDER — LACTATED RINGERS IV SOLN: 500.0000 mL | Freq: Once | INTRAVENOUS | Status: DC

## 2012-12-25 MED ORDER — FENTANYL 2.5 MCG/ML BUPIVACAINE 1/10 % EPIDURAL INFUSION (WH - ANES): INTRAMUSCULAR | Status: DC | PRN

## 2012-12-25 MED ORDER — PHENYLEPHRINE 40 MCG/ML (10ML) SYRINGE FOR IV PUSH (FOR BLOOD PRESSURE SUPPORT)
80.0000 ug | PREFILLED_SYRINGE | INTRAVENOUS | Status: DC | PRN
  Filled 2012-12-25: qty 2

## 2012-12-25 MED ORDER — OXYTOCIN BOLUS FROM INFUSION: 500.0000 mL | INTRAVENOUS | Status: DC

## 2012-12-25 MED ORDER — OXYCODONE-ACETAMINOPHEN 5-325 MG PO TABS: 1.0000 | ORAL_TABLET | ORAL | Status: DC | PRN

## 2012-12-25 MED ORDER — LACTATED RINGERS IV SOLN: 500.0000 mL | INTRAVENOUS | Status: DC | PRN

## 2012-12-25 NOTE — H&P (Signed)
Kristi West is a 22 y.o. female G1P0 with IUP at [redacted]w[redacted]d presenting for active labor. Pt states she has been having regular, every 5 minutes contractions, associated with none vaginal bleeding.  Membranes are intact, with active fetal movement.   [facility] [place] during her school months since 7 wks  Prenatal History/Complications: itching/Bile Acid level of 10.8 11/26  Past Medical History: Past Medical History  Diagnosis Date  . Anemia   . Seizures   . Albinism   . Heart murmur   . Abnormal Pap smear   . Pregnant 07/07/2012    Past Surgical History: Past Surgical History  Procedure Laterality Date  . Tonsillectomy    . Wisdom tooth extraction      Obstetrical History: OB History   Grav Para Term Preterm Abortions TAB SAB Ect Mult Living   1                Social History: History   Social History  . Marital Status: Single    Spouse Name: N/A    Number of Children: N/A  . Years of Education: N/A   Social History Main Topics  . Smoking status: Former Smoker -- .5 years    Types: Cigarettes  . Smokeless tobacco: Never Used  . Alcohol Use: No     Comment: occa  . Drug Use: No  . Sexual Activity: Yes    Birth Control/ Protection: None   Other Topics Concern  . None   Social History Narrative  . None    Family History: Family History  Problem Relation Age of Onset  . Cancer Maternal Aunt     stomach  . Cancer Maternal Grandmother     leukemia  . Diabetes Paternal Grandmother   . Stroke Paternal Grandfather   . SIDS Brother     Allergies: Allergies  Allergen Reactions  . Ceclor [Cefaclor] Hives  . Other Itching and Swelling    Fresh fruits cause the patient's mouth and throat to itch and swell.  . Peanut-Containing Drug Products Itching and Swelling    All types of nuts cause the patient's mouth and throat to swell and itch.    Prescriptions prior to admission  Medication Sig Dispense Refill  . calcium  carbonate (TUMS - DOSED IN MG ELEMENTAL CALCIUM) 500 MG chewable tablet Chew 2 tablets by mouth daily as needed for indigestion or heartburn.      . Prenatal Vit-Fe Fumarate-FA (PRENATAL MULTIVITAMIN) TABS tablet Take 1 tablet by mouth at bedtime.         Review of Systems   Constitutional: Negative for fever, chills, weight loss, malaise/fatigue and diaphoresis.  HENT: Negative for hearing loss, ear pain, nosebleeds, congestion, sore throat, neck pain, tinnitus and ear discharge.   Eyes: Negative for blurred vision, double vision, photophobia, pain, discharge and redness.  Respiratory: Negative for cough, hemoptysis, sputum production, shortness of breath, wheezing and stridor.   Cardiovascular: Negative for chest pain, palpitations, orthopnea,  leg swelling  Gastrointestinal: Positive for abdominal pain. Negative for heartburn, nausea, vomiting, diarrhea, constipation, blood in stool Genitourinary: Negative for dysuria, urgency, frequency, hematuria and flank pain.  Musculoskeletal: Negative for myalgias, back pain, joint pain and falls.  Skin: Negative for itching and rash.  Neurological: Negative for dizziness, tingling, tremors, sensory change, speech change, focal weakness, seizures, loss of consciousness, weakness and headaches.  Endo/Heme/Allergies: Negative for environmental allergies and polydipsia. Does not bruise/bleed easily.  Psychiatric/Behavioral: Negative for depression, suicidal  ideas, hallucinations, memory loss and substance abuse. The patient is not nervous/anxious and does not have insomnia.       Blood pressure 126/80, pulse 109, temperature 97.7 F (36.5 C), temperature source Oral, resp. rate 18, height 5' 7.5" (1.715 m), weight 107.049 kg (236 lb), last menstrual period 04/06/2012, SpO2 100.00%. General appearance: alert, cooperative and no distress Lungs: clear to auscultation bilaterally Heart: regular rate and rhythm Abdomen: soft, non-tender; bowel sounds  normal Extremities: Homans sign is negative, no sign of DVT DTR's 2+ Presentation: cephalic Fetal monitoringBaseline: 140 bpm, Variability: Good {> 6 bpm), Accelerations: Reactive and Decelerations: Absent Uterine activity  Regular q 4-5 minutes  Dilation: 6 Effacement (%): 90 Station: -2 Exam by:: Drenda Freeze, CNM   Prenatal labs: ABO, Rh: O/POS/-- (06/16 1125) Antibody: NEG (06/16 1125) Rubella:   RPR: Nonreactive (10/07 0000)  HBsAg: NEGATIVE (06/16 1125)  HIV: Non-reactive (10/07 0000)  GBS: Negative (12/04 0000)  1 hr Glucola 78 Genetic screening  normal Anatomy US normal   Results for orders placed during the hospital encounter of 12/25/12 (from the past 24 hour(s))  OB RESULTS CONSOLE GBS   Collection Time    12/25/12 12:00 AM      Result Value Range   GBS Negative    GROUP B STREP BY PCR   Collection Time    12/25/12  4:25 PM      Result Value Range   Group B strep by PCR NEGATIVE  NEGATIVE  CBC   Collection Time    12/25/12  4:40 PM      Result Value Range   WBC 12.5 (*) 4.0 - 10.5 K/uL   RBC 3.81 (*) 3.87 - 5.11 MIL/uL   Hemoglobin 10.6 (*) 12.0 - 15.0 g/dL   HCT 47.8 (*) 29.5 - 62.1 %   MCV 84.8  78.0 - 100.0 fL   MCH 27.8  26.0 - 34.0 pg   MCHC 32.8  30.0 - 36.0 g/dL   RDW 30.8  65.7 - 84.6 %   Platelets 258  150 - 400 K/uL  POCT URINALYSIS DIPSTICK   Collection Time    12/25/12  9:37 AM      Result Value Range   Color, UA       Clarity, UA       Glucose, UA neg     Bilirubin, UA       Ketones, UA neg     Spec Grav, UA       Blood, UA trace     pH, UA       Protein, UA trace     Urobilinogen, UA       Nitrite, UA neg     Leukocytes, UA moderate (2+)      Assessment: Kristi West is a 22 y.o. G1P0 with an IUP at [redacted]w[redacted]d presenting for active labor  Plan: epidural   CRESENZO-DISHMAN,Ailey Wessling 12/25/2012, 7:09 PM

## 2012-12-25 NOTE — Anesthesia Preprocedure Evaluation (Signed)
Anesthesia Evaluation  Patient identified by MRN, date of birth, ID band Patient awake    Reviewed: Allergy & Precautions, H&P , NPO status , Patient's Chart, lab work & pertinent test results  Airway Mallampati: II TM Distance: >3 FB Neck ROM: full    Dental no notable dental hx.    Pulmonary neg pulmonary ROS, former smoker,    Pulmonary exam normal       Cardiovascular negative cardio ROS      Neuro/Psych negative psych ROS   GI/Hepatic negative GI ROS, Neg liver ROS,   Endo/Other  negative endocrine ROS  Renal/GU negative Renal ROS  negative genitourinary   Musculoskeletal   Abdominal Normal abdominal exam  (+)   Peds  Hematology   Anesthesia Other Findings   Reproductive/Obstetrics (+) Pregnancy                           Anesthesia Physical Anesthesia Plan  ASA: II  Anesthesia Plan: Epidural   Post-op Pain Management:    Induction:   Airway Management Planned:   Additional Equipment:   Intra-op Plan:   Post-operative Plan:   Informed Consent: I have reviewed the patients History and Physical, chart, labs and discussed the procedure including the risks, benefits and alternatives for the proposed anesthesia with the patient or authorized representative who has indicated his/her understanding and acceptance.     Plan Discussed with:   Anesthesia Plan Comments:         Anesthesia Quick Evaluation

## 2012-12-25 NOTE — Telephone Encounter (Signed)
Received labs from [facility] drawn 11/26.  Bile salts 10.8.  Still itching.  Contacted pt.  Advised to come in fasting in the morning to repeat.  CRESENZO-DISHMAN,Evelisse Szalkowski 1:45 PM

## 2012-12-25 NOTE — MAU Note (Signed)
Patient states she is having contractions every 5 minutes. Denies bleeding or leaking and reports good fetal movement.  

## 2012-12-25 NOTE — Progress Notes (Addendum)
Returns for care and delivery after being in [facility] for [employer]. Records received.  Had q 2 week visits without problems.   Having some cramping and abdominal tightening. Denies vaginal bleeding and leaking of fluid. Was tested for cholestasis at [facility] after severe itching for a month and a half. [place] to send lab results. GBS collected. S/s of labor reviewed. All questions answered. F/u in 1 week for [facility].

## 2012-12-26 ENCOUNTER — Encounter (HOSPITAL_COMMUNITY): Payer: Self-pay | Admitting: *Deleted

## 2012-12-26 LAB — GC/CHLAMYDIA PROBE AMP: GC Probe RNA: NEGATIVE

## 2012-12-26 LAB — RPR: RPR Ser Ql: NONREACTIVE

## 2012-12-26 MED ORDER — DIBUCAINE 1 % RE OINT: 1.0000 "application " | TOPICAL_OINTMENT | RECTAL | Status: DC | PRN

## 2012-12-26 MED ORDER — WITCH HAZEL-GLYCERIN EX PADS: 1.0000 "application " | MEDICATED_PAD | CUTANEOUS | Status: DC | PRN

## 2012-12-26 MED ORDER — DIPHENHYDRAMINE HCL 25 MG PO CAPS: 25.0000 mg | ORAL_CAPSULE | Freq: Four times a day (QID) | ORAL | Status: DC | PRN

## 2012-12-26 MED ORDER — SIMETHICONE 80 MG PO CHEW: 80.0000 mg | CHEWABLE_TABLET | ORAL | Status: DC | PRN

## 2012-12-26 MED ORDER — SENNOSIDES-DOCUSATE SODIUM 8.6-50 MG PO TABS
2.0000 | ORAL_TABLET | ORAL | Status: DC
  Administered 2012-12-26 (×2): 2 via ORAL
  Filled 2012-12-26 (×2): qty 2

## 2012-12-26 MED ORDER — IBUPROFEN 600 MG PO TABS
600.0000 mg | ORAL_TABLET | Freq: Four times a day (QID) | ORAL | Status: DC
  Administered 2012-12-26 – 2012-12-27 (×5): 600 mg via ORAL
  Filled 2012-12-26 (×6): qty 1

## 2012-12-26 MED ORDER — MEASLES, MUMPS & RUBELLA VAC ~~LOC~~ INJ
0.5000 mL | INJECTION | Freq: Once | SUBCUTANEOUS | Status: DC
  Filled 2012-12-26: qty 0.5

## 2012-12-26 MED ORDER — ONDANSETRON HCL 4 MG PO TABS: 4.0000 mg | ORAL_TABLET | ORAL | Status: DC | PRN

## 2012-12-26 MED ORDER — METHYLERGONOVINE MALEATE 0.2 MG/ML IJ SOLN: 0.2000 mg | INTRAMUSCULAR | Status: DC | PRN

## 2012-12-26 MED ORDER — METHYLERGONOVINE MALEATE 0.2 MG PO TABS: 0.2000 mg | ORAL_TABLET | ORAL | Status: DC | PRN

## 2012-12-26 MED ORDER — TETANUS-DIPHTH-ACELL PERTUSSIS 5-2.5-18.5 LF-MCG/0.5 IM SUSP
0.5000 mL | Freq: Once | INTRAMUSCULAR | Status: AC
  Administered 2012-12-26: 0.5 mL via INTRAMUSCULAR

## 2012-12-26 MED ORDER — PRENATAL MULTIVITAMIN CH
1.0000 | ORAL_TABLET | Freq: Every day | ORAL | Status: DC
  Administered 2012-12-26 – 2012-12-27 (×2): 1 via ORAL
  Filled 2012-12-26 (×2): qty 1

## 2012-12-26 MED ORDER — ZOLPIDEM TARTRATE 5 MG PO TABS: 5.0000 mg | ORAL_TABLET | Freq: Every evening | ORAL | Status: DC | PRN

## 2012-12-26 MED ORDER — OXYCODONE-ACETAMINOPHEN 5-325 MG PO TABS: 1.0000 | ORAL_TABLET | ORAL | Status: DC | PRN

## 2012-12-26 MED ORDER — LANOLIN HYDROUS EX OINT: TOPICAL_OINTMENT | CUTANEOUS | Status: DC | PRN

## 2012-12-26 MED ORDER — BENZOCAINE-MENTHOL 20-0.5 % EX AERO: 1.0000 "application " | INHALATION_SPRAY | CUTANEOUS | Status: DC | PRN

## 2012-12-26 MED ORDER — BISACODYL 10 MG RE SUPP: 10.0000 mg | Freq: Every day | RECTAL | Status: DC | PRN

## 2012-12-26 MED ORDER — ONDANSETRON HCL 4 MG/2ML IJ SOLN: 4.0000 mg | INTRAMUSCULAR | Status: DC | PRN

## 2012-12-26 MED ORDER — FERROUS SULFATE 325 (65 FE) MG PO TABS
325.0000 mg | ORAL_TABLET | Freq: Two times a day (BID) | ORAL | Status: DC
  Administered 2012-12-26 – 2012-12-27 (×3): 325 mg via ORAL
  Filled 2012-12-26 (×3): qty 1

## 2012-12-26 MED ORDER — OXYTOCIN 40 UNITS IN LACTATED RINGERS INFUSION - SIMPLE MED: 62.5000 mL/h | INTRAVENOUS | Status: DC | PRN

## 2012-12-26 MED ORDER — FLEET ENEMA 7-19 GM/118ML RE ENEM: 1.0000 | ENEMA | Freq: Every day | RECTAL | Status: DC | PRN

## 2012-12-26 NOTE — Anesthesia Postprocedure Evaluation (Signed)
  Anesthesia Post Note  Patient: Kristi West  Procedure(s) Performed: * No procedures listed *  Anesthesia type: Epidural  Patient location: Mother/Baby  Post pain: Pain level controlled  Post assessment: Post-op Vital signs reviewed  Last Vitals:  Filed Vitals:   12/26/12 0641  BP: 114/68  Pulse: 96  Temp: 36.8 C  Resp: 20    Post vital signs: Reviewed  Level of consciousness:alert  Complications: No apparent anesthesia complications

## 2012-12-26 NOTE — Progress Notes (Signed)
Post Partum Day 1 Subjective: Kristi West is a 60 yof G1P1 who is PPD#1 for NSVD AROM.  She states she is doing well today, and has had no problems with getting up to walk or with urinating.  She states her pain is 5/10 and is tolerable.  She states she has not yet had a BM, however has passed flatus.  She is planning of breast-feeding her baby girl and plans on using OC.  She denies chest pain, SOB, nausea/vomiting  Objective: Blood pressure 114/68, pulse 96, temperature 98.3 F (36.8 C), temperature source Oral, resp. rate 20, height 5' 7.5" (1.715 m), weight 107.049 kg (236 lb), last menstrual period 04/06/2012, SpO2 99.00%, unknown if currently breastfeeding.  Physical Exam:  General: alert, cooperative and no distress Heart: Regular rate/rhythm.  S1, S2 distinct, no murmurs noted Lungs: CTA bilaterally Abdomen: SNT, BS active Lochia: appropriate Uterine Fundus: firm DVT Evaluation: No evidence of DVT seen on physical exam. Negative Homan's sign. No cords or calf tenderness. No significant calf/ankle edema.   Recent Labs  12/25/12 1640  HGB 10.6*  HCT 32.3*    Assessment/Plan: Plan for discharge tomorrow, Breastfeeding and Lactation consult   LOS: 1 day   [name] 12/26/2012, 9:26 AM   I have seen this patient and agree with the above PA student's note.  LEFTWICH-KIRBY, Kim Oki Certified Nurse-Midwife

## 2012-12-26 NOTE — Lactation Note (Signed)
This note was copied from the chart of Kristi West. Lactation Consultation Note  Patient Name: Kristi West [id number] Date: 12/26/2012 Reason for consult: Initial assessment of this primipara and her newborn at 77 hours of age.  Mom has baby STS and states she just finished breastfeeding and this time she was able to latch baby without nurse's help.  Baby has fed 5 times since birth and LATCH scores=7 due to need for help from nurse.  LC reviewed STS and cue feedings. Mom says her nurse has shown her hand expression technique and earlier mom had expressed 8 ml's of colostrum which she spoon fed to baby.  LC encouraged review of Baby and Me pp 14 and 20-25 for STS and BF information. LC provided [employer] brochure and reviewed [facility] services and list of community and web site resources.    Maternal Data Formula Feeding for Exclusion: No Infant to breast within first hour of birth: Yes Has patient been taught Hand Expression?: Yes (per nurse) Does the patient have breastfeeding experience prior to this delivery?: No  Feeding Feeding Type: Breast Fed  LATCH Score/Interventions Latch: Repeated attempts needed to sustain latch, nipple held in mouth throughout feeding, stimulation needed to elicit sucking reflex. Intervention(s): Adjust position;Assist with latch  Audible Swallowing: Spontaneous and intermittent Intervention(s): Skin to skin;Hand expression  Type of Nipple: Everted at rest and after stimulation  Comfort (Breast/Nipple): Filling, red/small blisters or bruises, mild/mod discomfort  Problem noted: Mild/Moderate discomfort Interventions (Mild/moderate discomfort):  (helped to get deeper latch)  Hold (Positioning): Assistance needed to correctly position infant at breast and maintain latch. Intervention(s): Skin to skin;Support Pillows (helped to latch and deepen latch)  LATCH Score: 7  Lactation Tools Discussed/Used   STS, hand expression, cue feedings  Consult  Status Consult Status: Follow-up Date: 12/27/12 Follow-up type: In-patient    Warrick Parisian [facility] 12/26/2012, 10:08 PM

## 2012-12-26 NOTE — H&P (Signed)
Attestation of Attending Supervision of Advanced Practitioner (CNM/NP): Evaluation and management procedures were performed by the Advanced Practitioner under my supervision and collaboration.  I have reviewed the Advanced Practitioner's note and chart, and I agree with the management and plan.  Cabrini Ruggieri 12/26/2012 8:03 AM

## 2012-12-26 NOTE — Progress Notes (Signed)
UR chart review completed.  

## 2012-12-27 LAB — STREP B DNA PROBE: GBSP: NEGATIVE

## 2012-12-27 MED ORDER — IBUPROFEN 600 MG PO TABS: 600.0000 mg | ORAL_TABLET | Freq: Four times a day (QID) | ORAL | Status: DC

## 2012-12-27 NOTE — Lactation Note (Signed)
This note was copied from the chart of Kristi West. Lactation Consultation Note: Mom had baby latched to the breast when I went into room. Assisted mom with positioning/pillows and she reports that feels better. Mom reports that baby has nursed a lot through the night and has been fussy. Reassurance given. Mom asking about pumps- reports she wants to pump and bottle feed EBM. Has WIC in [address]-  encouraged to call them about a pump. Has manual pump in room. Reports that her nipples are sore- it feels like he is biting on the tip of the nipple. Comfort gels given with instructions.  Reviewed wide open mouth and keeping the baby close tot he breast throughout the feeding. No questions at present. To call prn.   Patient Name: Kristi West [id number] Date: 12/27/2012 Reason for consult: Follow-up assessment   Maternal Data    Feeding Feeding Type: Breast Fed  LATCH Score/Interventions Latch: Grasps breast easily, tongue down, lips flanged, rhythmical sucking. Intervention(s): Adjust position;Assist with latch  Audible Swallowing: A few with stimulation Intervention(s): Hand expression  Type of Nipple: Flat  Comfort (Breast/Nipple): Filling, red/small blisters or bruises, mild/mod discomfort  Problem noted: Mild/Moderate discomfort Interventions (Mild/moderate discomfort): Comfort gels;Pre-pump if needed  Hold (Positioning): Assistance needed to correctly position infant at breast and maintain latch. Intervention(s): Breastfeeding basics reviewed;Support Pillows;Position options  LATCH Score: 6  Lactation Tools Discussed/Used Tools: Comfort gels WIC Program: Yes   Consult Status Consult Status: Complete    Pamelia Hoit 12/27/2012, 9:34 AM

## 2012-12-27 NOTE — Discharge Summary (Signed)
Obstetric Discharge Summary Reason for Admission: onset of labor Prenatal Procedures: none Intrapartum Procedures: spontaneous vaginal delivery Postpartum Procedures: none Complications-Operative and Postpartum: none Hemoglobin  Date Value Range Status  12/25/2012 10.6* 12.0 - 15.0 g/dL Final     HCT  Date Value Range Status  12/25/2012 32.3* 36.0 - 46.0 % Final   Ms Raysor is a 22yo G1 admitted at 37.4wks in active labor on the evening of 12/4. She quickly progressed to SVD that same evening. By PPD#2 she was deemed to have received the full benefit of her hospital stay and will be discharged home. She is breastfeeding and is undecided on contraception as of now.  Physical Exam:  General: alert, cooperative and no distress Heart: RRR Lungs: nl effort Lochia: appropriate Uterine Fundus: firm DVT Evaluation: No evidence of DVT seen on physical exam.  Discharge Diagnoses: Term Pregnancy-delivered  Discharge Information: Date: 12/27/2012 Activity: pelvic rest Diet: routine Medications: PNV and Ibuprofen Condition: stable Instructions: refer to practice specific booklet Discharge to: home Follow-up Information   Follow up with [facility]. Call in 4 weeks.   Contact information:   [address] [place] [address] [phone]      Newborn Data: Live born female  Birth Weight: 7 lb 0.5 oz (3189 g) APGAR: 9, 9  Home with mother.  Cam Hai 12/27/2012, 8:58 AM

## 2012-12-29 ENCOUNTER — Encounter: Payer: Medicaid Other | Admitting: Women's Health

## 2013-01-02 ENCOUNTER — Encounter: Payer: Medicaid Other | Admitting: Obstetrics & Gynecology

## 2013-01-06 ENCOUNTER — Ambulatory Visit (INDEPENDENT_AMBULATORY_CARE_PROVIDER_SITE_OTHER): Payer: Medicaid Other | Admitting: Family Medicine

## 2013-01-06 ENCOUNTER — Encounter: Payer: Self-pay | Admitting: Family Medicine

## 2013-01-06 VITALS — BP 121/79 | HR 80 | Temp 98.5°F | Ht 67.5 in | Wt 222.0 lb

## 2013-01-06 DIAGNOSIS — J029 Acute pharyngitis, unspecified: Secondary | ICD-10-CM

## 2013-01-06 DIAGNOSIS — J069 Acute upper respiratory infection, unspecified: Secondary | ICD-10-CM

## 2013-01-06 LAB — POCT RAPID STREP A (OFFICE): Rapid Strep A Screen: NEGATIVE

## 2013-01-06 NOTE — Progress Notes (Signed)
   Subjective:    Patient ID: Kristi West, female    DOB: 05/14/1990, 22 y.o.   MRN: [id number]  HPI URI Symptoms Onset: 2-3 days  Description: nasal congestion, sore throat, drainage, mild cough  Modifying factors:  2 weeks post partum  Symptoms Nasal discharge: yes Fever: no Sore throat: yes Cough: minimal to mild  Wheezing: no Ear pain: no GI symptoms: no Sick contacts: no  Red Flags  Stiff neck: no Dyspnea: no Rash: no Swallowing difficulty: no  Sinusitis Risk Factors Headache/face pain: no Double sickening: no tooth pain: no  Allergy Risk Factors Sneezing: no Itchy scratchy throat: no Seasonal symptoms: no  Flu Risk Factors Headache: no muscle aches: no severe fatigue: no     Review of Systems  All other systems reviewed and are negative.       Objective:   Physical Exam  Constitutional: She appears well-developed and well-nourished.  HENT:  Head: Normocephalic and atraumatic.  Right Ear: External ear normal.  Left Ear: External ear normal.  +nasal erythema, rhinorrhea bilaterally, + post oropharyngeal erythema  S/p tonsillectomy     Eyes: Conjunctivae are normal. Pupils are equal, round, and reactive to light.  Neck: Normal range of motion. Neck supple.  Cardiovascular: Normal rate and regular rhythm.   Pulmonary/Chest: Effort normal and breath sounds normal.  Abdominal: Soft.  Musculoskeletal: Normal range of motion.  Lymphadenopathy:    She has no cervical adenopathy.  Neurological: She is alert.  Skin: Skin is warm.          Assessment & Plan:  Sore throat - Plan: POCT rapid strep A  URI (upper respiratory infection)  Likley viral source of sxs  Rapid strep negative  Discussed supportive care and infectious/resp red flags.  May continue breastfeeding-passive immunity to baby Discussed infectious red flags for baby Follow up as needed.

## 2013-01-27 ENCOUNTER — Encounter: Payer: Self-pay | Admitting: Advanced Practice Midwife

## 2013-01-27 ENCOUNTER — Ambulatory Visit (INDEPENDENT_AMBULATORY_CARE_PROVIDER_SITE_OTHER): Payer: Medicaid Other | Admitting: Advanced Practice Midwife

## 2013-01-27 DIAGNOSIS — Z3202 Encounter for pregnancy test, result negative: Secondary | ICD-10-CM

## 2013-01-27 LAB — POCT URINE PREGNANCY: Preg Test, Ur: NEGATIVE

## 2013-01-27 MED ORDER — NORETHINDRONE 0.35 MG PO TABS: 1.0000 | ORAL_TABLET | Freq: Every day | ORAL | Status: DC

## 2013-01-27 NOTE — Progress Notes (Signed)
Noel GeroldReakia L Corsino is a 23 y.o. who presents for a postpartum visit. She is 4 weeks postpartum following a spontaneous vaginal delivery. I have fully reviewed the prenatal and intrapartum course. The delivery was at 37.4 gestational weeks.  Anesthesia: epidural. Postpartum course has been uneventful. Baby's course has been uneventful. Baby is feeding by bottle mostly and still pumping about 2-4 oz/day. Bleeding: started period 2 days ago. . Bowel function is normal. Bladder function is normal. Patient is sexually active. Contraception method is none. Postpartum depression screening: negative.    Review of Systems   Constitutional: Negative for fever and chills Eyes: Negative for visual disturbances Respiratory: Negative for shortness of breath, dyspnea Cardiovascular: Negative for chest pain or palpitations  Gastrointestinal: Negative for vomiting, diarrhea and constipation Genitourinary: Negative for dysuria and urgency Musculoskeletal: Negative for back pain, joint pain, myalgias  Neurological: Negative for dizziness and headaches   Objective:    There were no vitals filed for this visit. General:  alert, cooperative and no distress   Breasts:  negative  Lungs: clear to auscultation bilaterally  Heart:  regular rate and rhythm  Abdomen: Soft, nontender  Declined pelvic denies problems               Rectal Exam: no hemorrhoids        Assessment:    normal postpartum exam.  Plan:    1. Contraception: POP's 2. Follow up in:  this year for a pap or as needed.

## 2013-03-23 ENCOUNTER — Telehealth: Payer: Self-pay | Admitting: Obstetrics and Gynecology

## 2013-03-23 NOTE — Telephone Encounter (Signed)
Spoke with pt. Delivered 12/25/12. Hasn't had a period yet. Was breastfeeding but not now. Not on any birth control now. Condom broke last pm. Wants to take Plan B. Spoke with Selena BattenKim, CNM. She reviewed with Victorino DikeJennifer, NP who advised to take a pregnancy test. As long as it's negative, she can take Plan B. Pt states she has an appt here tomorrow. Pt voiced understanding. JSY

## 2013-03-24 ENCOUNTER — Other Ambulatory Visit: Payer: Medicaid Other | Admitting: Adult Health

## 2013-04-08 ENCOUNTER — Other Ambulatory Visit: Payer: Medicaid Other | Admitting: Adult Health

## 2013-04-20 ENCOUNTER — Telehealth: Payer: Self-pay | Admitting: *Deleted

## 2013-04-20 NOTE — Telephone Encounter (Signed)
Patient came in today for extreme menstrual cramps Patient just had a baby 3 months ago Patient was advised to try Ibuprofen OTC and to call her OBGYN if the cramps continued Patient also stated that she took a plan B pill OTC a week ago and I then told her she needed to contact her OB and see if they can get her in sooner.  Patient agreed and understood and said she would call her OBGYN today she was also advised if they couldn't get her in to call us back and we will try and see her

## 2013-05-14 ENCOUNTER — Other Ambulatory Visit: Payer: Medicaid Other | Admitting: Advanced Practice Midwife

## 2013-05-21 ENCOUNTER — Other Ambulatory Visit: Payer: Medicaid Other | Admitting: Advanced Practice Midwife

## 2013-06-01 ENCOUNTER — Encounter: Payer: Self-pay | Admitting: Family

## 2013-06-01 ENCOUNTER — Ambulatory Visit (INDEPENDENT_AMBULATORY_CARE_PROVIDER_SITE_OTHER): Payer: Medicaid Other | Admitting: Family

## 2013-06-01 VITALS — BP 134/74 | HR 76 | Temp 99.0°F | Ht 67.0 in | Wt 149.0 lb

## 2013-06-01 DIAGNOSIS — Z3009 Encounter for other general counseling and advice on contraception: Secondary | ICD-10-CM

## 2013-06-01 DIAGNOSIS — Z0000 Encounter for general adult medical examination without abnormal findings: Secondary | ICD-10-CM

## 2013-06-01 DIAGNOSIS — Z01419 Encounter for gynecological examination (general) (routine) without abnormal findings: Secondary | ICD-10-CM

## 2013-06-01 MED ORDER — NORGESTIM-ETH ESTRAD TRIPHASIC 0.18/0.215/0.25 MG-25 MCG PO TABS: 1.0000 | ORAL_TABLET | Freq: Every day | ORAL | Status: DC

## 2013-06-01 NOTE — Patient Instructions (Signed)

## 2013-06-01 NOTE — Progress Notes (Signed)
   Subjective:    Patient ID: Kristi West, female    DOB: Dec 09, 1990, 23 y.o.   MRN: [id number]  Gynecologic Exam   Pt here for annual physical and pap. Pt had a baby Dec. 4 and would like to be started on birth control. Pt had been on a progestin only pill for only a month. Pt stopped breastfeeding and did not want to continue the progestin pill. Pt denies any pain or concerns at this time.    Review of Systems  HENT: Negative.   Respiratory: Negative.   Cardiovascular: Negative.   Gastrointestinal: Negative.   Genitourinary: Negative.   Musculoskeletal: Negative.   All other systems reviewed and are negative.      Objective:   Physical Exam  Vitals reviewed. Constitutional: She is oriented to person, place, and time. She appears well-developed and well-nourished. No distress.  HENT:  Head: Normocephalic and atraumatic.  Right Ear: External ear normal.  Mouth/Throat: Oropharynx is clear and moist.  Eyes: Pupils are equal, round, and reactive to light.  Neck: Normal range of motion. Neck supple. No thyromegaly present.  Cardiovascular: Normal rate, regular rhythm, normal heart sounds and intact distal pulses.   No murmur heard. Pulmonary/Chest: Effort normal and breath sounds normal. No respiratory distress. She has no wheezes. Right breast exhibits no inverted nipple, no mass, no nipple discharge, no skin change and no tenderness. Left breast exhibits no inverted nipple, no mass, no nipple discharge, no skin change and no tenderness.  Abdominal: Soft. Bowel sounds are normal. She exhibits no distension. There is no tenderness.  Genitourinary: Vagina normal and uterus normal. No vaginal discharge found.  Musculoskeletal: Normal range of motion. She exhibits no edema and no tenderness.  Neurological: She is alert and oriented to person, place, and time. She has normal reflexes. No cranial nerve deficit.  Skin: Skin is warm and dry.  Psychiatric: She has a normal mood and  affect. Her behavior is normal. Judgment and thought content normal.      BP 134/74  Pulse 76  Temp(Src) 99 F (37.2 C) (Oral)  Ht 5\' 7"  (1.702 m)  Wt 149 lb (67.586 kg)  BMI 23.33 kg/m2  LMP 05/12/2013  Breastfeeding? No     Assessment & Plan:  1. Annual physical exam - Basic Metabolic Panel - Pap IG, CT/NG w/ reflex HPV when [facility] [employer]  2. Encounter for routine gynecological examination    Discussed risk of birth control pills and blood clots, pt educated on importance of not smoking and s/s of blood clots Labs pending Health Maintenance reviewed Diet and exercise encouraged RTO 1 year  Kristi Rodneyhristy Hawks, FNP

## 2013-06-02 LAB — BASIC METABOLIC PANEL
BUN/Creatinine Ratio: 17 (ref 8–20)
BUN: 12 mg/dL (ref 6–20)
CO2: 19 mmol/L (ref 18–29)
Calcium: 9 mg/dL (ref 8.7–10.2)
Chloride: 102 mmol/L (ref 97–108)
Creatinine, Ser: 0.71 mg/dL (ref 0.57–1.00)
GFR calc non Af Amer: 121 mL/min/{1.73_m2} (ref >59)
GFR, EST AFRICAN AMERICAN: 140 mL/min/{1.73_m2} (ref >59)
Glucose: 71 mg/dL (ref 65–99)
POTASSIUM: 3.7 mmol/L (ref 3.5–5.2)
Sodium: 138 mmol/L (ref 134–144)

## 2013-06-04 LAB — PAP IG, CT-NG, RFX HPV ASCU
CHLAMYDIA, NUC. ACID AMP: NEGATIVE
GONOCOCCUS BY NUCLEIC ACID AMP: NEGATIVE
PAP Smear Comment: 0

## 2013-06-04 LAB — SPECIMEN STATUS REPORT

## 2013-06-05 ENCOUNTER — Other Ambulatory Visit: Payer: Self-pay | Admitting: Family

## 2013-06-05 DIAGNOSIS — R85619 Unspecified abnormal cytological findings in specimens from anus: Secondary | ICD-10-CM

## 2013-06-05 DIAGNOSIS — R87619 Unspecified abnormal cytological findings in specimens from cervix uteri: Secondary | ICD-10-CM

## 2013-06-08 ENCOUNTER — Other Ambulatory Visit: Payer: Self-pay | Admitting: Family Medicine

## 2013-06-17 ENCOUNTER — Ambulatory Visit: Payer: Medicaid Other | Admitting: Obstetrics and Gynecology

## 2013-06-19 ENCOUNTER — Other Ambulatory Visit: Payer: Self-pay | Admitting: Nurse Practitioner

## 2013-11-23 ENCOUNTER — Encounter: Payer: Self-pay | Admitting: Family

## 2013-11-30 ENCOUNTER — Telehealth: Payer: Self-pay | Admitting: Nurse Practitioner

## 2013-11-30 NOTE — Telephone Encounter (Signed)
Pt given appt for Wed.

## 2013-12-02 ENCOUNTER — Ambulatory Visit (INDEPENDENT_AMBULATORY_CARE_PROVIDER_SITE_OTHER): Payer: PRIVATE HEALTH INSURANCE | Admitting: Nurse Practitioner

## 2013-12-02 ENCOUNTER — Encounter: Payer: Self-pay | Admitting: Nurse Practitioner

## 2013-12-02 VITALS — BP 136/80 | HR 99 | Temp 97.1°F | Ht 67.0 in | Wt 265.6 lb

## 2013-12-02 DIAGNOSIS — R002 Palpitations: Secondary | ICD-10-CM

## 2013-12-02 DIAGNOSIS — F410 Panic disorder [episodic paroxysmal anxiety] without agoraphobia: Secondary | ICD-10-CM

## 2013-12-02 DIAGNOSIS — N926 Irregular menstruation, unspecified: Secondary | ICD-10-CM

## 2013-12-02 NOTE — Patient Instructions (Signed)
Menstruation °Menstruation is the monthly passing of blood, tissue, fluid and mucus, also know as a period. Your body is shedding the lining of the uterus. The flow, or amount of blood, usually lasts from 3-7 days each month. Hormones control the menstrual cycle. Hormones are a chemical substance produced by endocrine glands in the body to regulate different bodily functions. °The first menstrual period may start any time between age 22 years to 16 years. However, it usually starts around age 12 years. Some girls have regular monthly menstrual cycles right from the beginning. However, it is not unusual to have only a couple of drops of blood or spotting when you first start menstruating. It is also not unusual to have two periods a month or miss a month or two when first starting your periods. °SYMPTOMS  °· Mild to moderate abdominal cramps. °· Aching or pain in the lower back area. °Symptoms may occur 5-10 days before your menstrual period starts. These symptoms are referred to as premenstrual syndrome (PMS). These symptoms can include: °· Headache. °· Breast tenderness and swelling. °· Bloating. °· Tiredness (fatigue). °· Mood changes. °· Craving for certain foods. °These are normal signs and symptoms and can vary in severity. To help relieve these problems, ask your caregiver if you can take over-the-counter medications for pain or discomfort. If the symptoms are not controllable, see your caregiver for help.  °HORMONES INVOLVED IN MENSTRUATION °Menstruation comes about because of hormones produced by the pituitary gland in the brain and the ovaries that affect the uterine lining. °First, the pituitary gland in the brain produces the hormone follicle stimulating hormone (FSH). FSH stimulates the ovaries to produce estrogen, which thickens the uterine lining and begins to develop an egg in the ovary. About 14 days later, the pituitary gland produces another hormone called luteinizing hormone (LH). LH causes the egg  to come out of a sac in the ovary (ovulation). The empty sac on the ovary called the corpus luteum is stimulated by another hormone from the pituitary gland called luteotropin. The corpus luteum begins to produce the estrogen and progesterone hormone. The progesterone hormone prepares the lining of the uterus to have the fertilized egg (egg combined with sperm) attach to the lining of the uterus and begin to develop into a fetus. If the egg is not fertilized, the corpus luteum stops producing estrogen and progesterone, it disappears, the lining of the uterus sloughs off and a menstrual period begins. Then the menstrual cycle starts all over again and will continue monthly unless pregnancy occurs or menopause begins. °The secretion of hormones is complex. Various parts of the body become involved in many chemical activities. Female sex hormones have other functions in a woman's body as well. Estrogen increases a woman's sex drive (libido). It naturally helps body get rid of fluids (diuretic). It also aids in the process of building new bone. Therefore, maintaining hormonal health is essential to all levels of a woman's well being. These hormones are usually present in normal amounts and cause you to menstruate. It is the relationship between the (small) levels of the hormones that is critical. When the balance is upset, menstrual irregularities can occur. °HOW DOES THE MENSTRUAL CYCLE HAPPEN? °· Menstrual cycles vary in length from 21-35 days with an average of 29 days. The cycle begins on the first day of bleeding. At this time, the pituitary gland in the brain releases FSH that travels through the bloodstream to the ovaries. The FSH stimulates the follicles in the   ovaries. This prepares the body for ovulation that occurs around the 14th day of the cycle. The ovaries produce estrogen, and this makes sure conditions are right in the uterus for implantation of the fertilized egg. °· When the levels of estrogen reach a  high enough level, it signals the gland in the brain (pituitary gland) to release a surge of LH. This causes the release of the ripest egg from its follicle (ovulation). Usually only one follicle releases one egg, but sometimes more than one follicle releases an egg especially when stimulating the ovaries for in vitro fertilization. The egg can then be collected by either fallopian tube to await fertilization. The burst follicle within the ovary that is left behind is now called the corpus luteum or "yellow body." The corpus luteum continues to give off (secrete) reduced amounts of estrogen. This closes and hardens the cervix. It dries up the mucus to the naturally infertile condition. °· The corpus luteum also begins to give off greater amounts of progesterone. This causes the lining of the uterus (endometrium) to thicken even more in preparation for the fertilized egg. The egg is starting to journey down from the fallopian tube to the uterus. It also signals the ovaries to stop releasing eggs. It assists in returning the cervical mucus to its infertile state. °· If the egg implants successfully into the womb lining and pregnancy occurs, progesterone levels will continue to raise. It is often this hormone that gives some pregnant women a feeling of well being, like a "natural high." Progesterone levels drop again after childbirth. °· If fertilization does not occur, the corpus luteum dies, stopping the production of hormones. This sudden drop in progesterone causes the uterine lining to break down, accompanied by blood (menstruation). °· This starts the cycle back at day 1. The whole process starts all over again. Woman go through this cycle every month from puberty to menopause. Women have breaks only for pregnancy and breastfeeding (lactation), unless the woman has health problems that affect the female hormone system or chooses to use oral contraceptives to have unnatural menstrual periods. °HOME CARE  INSTRUCTIONS  °· Keep track of your periods by using a calendar. °· If you use tampons, get the least absorbent to avoid toxic shock syndrome. °· Do not leave tampons in the vagina over night or longer than 6 hours. °· Wear a sanitary pad over night. °· Exercise 3-5 times a week or more. °· Avoid foods and drinks that you know will make your symptoms worse before or during your period. °SEEK MEDICAL CARE IF:  °· You develop a fever with your period. °· Your periods are lasting more than 7 days. °· Your period is so heavy that you have to change pads or tampons every 30 minutes. °· You develop clots with your period and never had clots before. °· You cannot get relief from over-the-counter medication for your symptoms. °· Your period has not started, and it has been longer than 35 days. °Document Released: 12/29/2001 Document Revised: 01/13/2013 Document Reviewed: 08/07/2012 °ExitCare® Patient Information ©2015 [employer]. This information is not intended to replace advice given to you by your health care provider. Make sure you discuss any questions you have with your health care provider. ° °

## 2013-12-02 NOTE — Progress Notes (Signed)
   Subjective:    Patient ID: Kristi West, female    DOB: [birth year], 23 y.o.   MRN: [id number]  HPI Patient in today c/o abnormal menses- Had a baby on December4,2014- started menses back in February  And you have been regular every month up until this month and she has been bleeding for 2 weeks- on no birth control.  - She is also has been having panic attacks- went to ER and they gave her something to take for a week then stop and see if return. Has not had heart palpitations in a couple of weeks.   Review of Systems  Constitutional: Negative.   HENT: Negative.   Respiratory: Negative.   Cardiovascular: Negative.   Genitourinary: Negative.   Neurological: Negative.   Psychiatric/Behavioral: Negative.   All other systems reviewed and are negative.      Objective:   Physical Exam  Constitutional: She is oriented to person, place, and time. She appears well-developed and well-nourished.  Cardiovascular: Normal rate, regular rhythm and normal heart sounds.   Pulmonary/Chest: Effort normal and breath sounds normal.  Neurological: She is alert and oriented to person, place, and time.  Skin: Skin is warm and dry.  Psychiatric: She has a normal mood and affect. Her behavior is normal. Judgment and thought content normal.   BP 136/80 mmHg  Pulse 99  Temp(Src) 97.1 F (36.2 C) (Oral)  Ht 5\' 7"  (1.702 m)  Wt 265 lb 9.6 oz (120.475 kg)  BMI 41.59 kg/m2        Assessment & Plan:  1. Irregular menses Just watch for now since this is the first time this has occurred Patient does not want to be on birth control  2. Palpitations 3. Panic attacks Keep diary of episodes Avoid caffeine RTO if continue to occur.  Mary-Margaret Daphine DeutscherMartin, FNP

## 2014-01-05 ENCOUNTER — Telehealth: Payer: Self-pay | Admitting: Family

## 2014-01-06 ENCOUNTER — Telehealth: Payer: Self-pay

## 2014-01-06 DIAGNOSIS — R87619 Unspecified abnormal cytological findings in specimens from cervix uteri: Secondary | ICD-10-CM

## 2014-01-06 NOTE — Telephone Encounter (Signed)
Aware, referral done. 

## 2014-01-06 NOTE — Telephone Encounter (Signed)
Referral made 

## 2014-01-06 NOTE — Telephone Encounter (Signed)
[facility] abnormal PAP

## 2014-01-25 ENCOUNTER — Encounter: Payer: PRIVATE HEALTH INSURANCE | Admitting: Obstetrics & Gynecology

## 2014-01-29 ENCOUNTER — Ambulatory Visit (INDEPENDENT_AMBULATORY_CARE_PROVIDER_SITE_OTHER): Payer: PRIVATE HEALTH INSURANCE | Admitting: Obstetrics & Gynecology

## 2014-01-29 ENCOUNTER — Encounter: Payer: Self-pay | Admitting: Obstetrics & Gynecology

## 2014-01-29 VITALS — BP 120/80 | Wt 270.0 lb

## 2014-01-29 DIAGNOSIS — IMO0002 Reserved for concepts with insufficient information to code with codable children: Secondary | ICD-10-CM

## 2014-01-29 DIAGNOSIS — R896 Abnormal cytological findings in specimens from other organs, systems and tissues: Secondary | ICD-10-CM

## 2014-01-29 NOTE — Progress Notes (Signed)
Patient ID: Kristi West, female   DOB: 1990-07-09, 24 y.o.   MRN: [id number] Patient ID: Kristi West, female   DOB: 1990-07-09, 24 y.o.   MRN: [id number]  Chief Complaint  Patient presents with  . Referral    abnormal pap. sign consent. colpo    HPI Kristi West is a 24 y.o. female.   HPI  Indications: Pap smear on May 2015 showed: low-grade squamous intraepithelial neoplasia (LGSIL - encompassing HPV,mild dysplasia,CIN I). Previous colposcopy: . Prior cervical treatment: no treatment.  Past Medical History  Diagnosis Date  . Anemia   . Seizures   . Albinism   . Heart murmur   . Abnormal Pap smear   . Pregnant 07/07/2012  . Anxiety   . Vaginal Pap smear, abnormal     Past Surgical History  Procedure Laterality Date  . Tonsillectomy    . Wisdom tooth extraction      Family History  Problem Relation Age of Onset  . Cancer Maternal Aunt     stomach  . Cancer Maternal Grandmother     leukemia  . Diabetes Paternal Grandmother   . Stroke Paternal Grandfather   . SIDS Brother     Social History History  Substance Use Topics  . Smoking status: Former Smoker -- .5 years    Types: Cigarettes  . Smokeless tobacco: Never Used  . Alcohol Use: No     Comment: occa    Allergies  Allergen Reactions  . Ceclor [Cefaclor] Hives  . Other Itching and Swelling    Fresh fruits cause the patient's mouth and throat to itch and swell.  . Peanut-Containing Drug Products Itching and Swelling    All types of nuts cause the patient's mouth and throat to swell and itch.    Current Outpatient Prescriptions  Medication Sig Dispense Refill  . ALPRAZolam (NIRAVAM) 0.25 MG dissolvable tablet Take 0.25 mg by mouth at bedtime as needed for anxiety.     No current facility-administered medications for this visit.    Review of Systems Review of Systems  Blood pressure 120/80, weight 270 lb (122.471 kg), last menstrual period 01/16/2014, not currently breastfeeding.  Physical  Exam Physical Exam  Data Reviewed   Assessment   HPV atypia on colposcopy Procedure Details  The risks and benefits of the procedure and Written informed consent obtained.  Speculum placed in vagina and excellent visualization of cervix achieved, cervix swabbed x 3 with acetic acid solution.  Specimens: none  Complications: none.     Plan Follow up Pap in 6 months           EURE,LUTHER H 01/29/2014, 12:30 PM

## 2014-08-05 ENCOUNTER — Encounter: Payer: Self-pay | Admitting: Family Medicine

## 2014-08-05 ENCOUNTER — Ambulatory Visit (INDEPENDENT_AMBULATORY_CARE_PROVIDER_SITE_OTHER): Payer: PRIVATE HEALTH INSURANCE | Admitting: Family Medicine

## 2014-08-05 VITALS — BP 124/80 | HR 80 | Temp 98.8°F | Ht 67.0 in | Wt 275.0 lb

## 2014-08-05 DIAGNOSIS — M546 Pain in thoracic spine: Secondary | ICD-10-CM | POA: Diagnosis not present

## 2014-08-05 MED ORDER — MELOXICAM 7.5 MG PO TABS: 7.5000 mg | ORAL_TABLET | Freq: Every day | ORAL | Status: DC

## 2014-08-05 MED ORDER — CYCLOBENZAPRINE HCL 10 MG PO TABS: 10.0000 mg | ORAL_TABLET | Freq: Every day | ORAL | Status: DC

## 2014-08-05 NOTE — Progress Notes (Signed)
   Subjective:    Patient ID: Kristi West, female    DOB: May 31, 1990, 24 y.o.   MRN: [id number]  HPI 24 year old female with mid left-sided back pain. There is no radiation to the arms or legs and no pain in the neck or lumbar spine. 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  Patient Active Problem List   Diagnosis Date Noted  . Supervision of normal first pregnancy 08/18/2012  . Nystagmus 07/28/2012   Outpatient Encounter Prescriptions as of 08/05/2014  Medication Sig  . ALPRAZolam (NIRAVAM) 0.25 MG dissolvable tablet Take 0.25 mg by mouth at bedtime as needed for anxiety.   No facility-administered encounter medications on file as of 08/05/2014.      Review of Systems  HENT: Negative.   Cardiovascular: Negative.   Gastrointestinal: Negative.   Musculoskeletal: Positive for back pain.  Psychiatric/Behavioral: Negative.        Objective:   Physical Exam  Constitutional: She appears well-developed and well-nourished.  Musculoskeletal:  Back: There is some knotting and tightness to the left of the thoracic spine at about the level of T3 or 4. Pain does not really involve scapula. Pain is not worsened by neck flexion or extension or by lumbar flexion or extension.    BP 124/80 mmHg  Pulse 80  Temp(Src) 98.8 F (37.1 C) (Oral)  Ht 5\' 7"  (1.702 m)  Wt 275 lb (124.739 kg)  BMI 43.06 kg/m2        Assessment & Plan:  1. Left-sided thoracic back pain Mechanical back strain probably related to occupation. Have suggested will get ergonomics of her workplace. Have suggested massage with bio freeze or similar compound and prescriptions for Flexeril 10 mg at nighttime and meloxicam 7.5 mg in the morning  Frederica KusterStephen M Miller MD

## 2014-09-02 ENCOUNTER — Ambulatory Visit: Payer: PRIVATE HEALTH INSURANCE | Admitting: Family Medicine

## 2014-09-09 ENCOUNTER — Other Ambulatory Visit: Payer: PRIVATE HEALTH INSURANCE | Admitting: Nurse Practitioner

## 2014-11-12 IMAGING — US US BREAST*L*
1 series · 4 of 4 positions shown · non-contrast
Comparison: None.

CLINICAL DATA: Palpable mass in a 21-year-old female

LEFT BREAST ULTRASOUND

[Series 1: us breast*left* · 0.06mm/px · 4 of 4 slices shown]
[im 1/4]
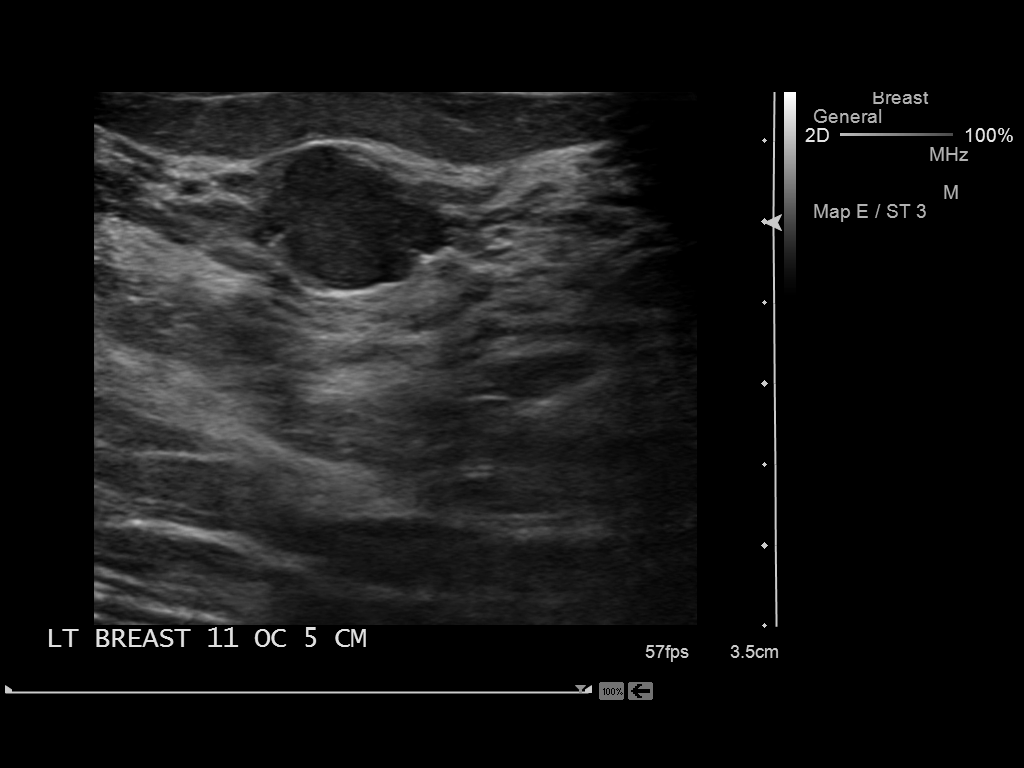
[im 2/4]
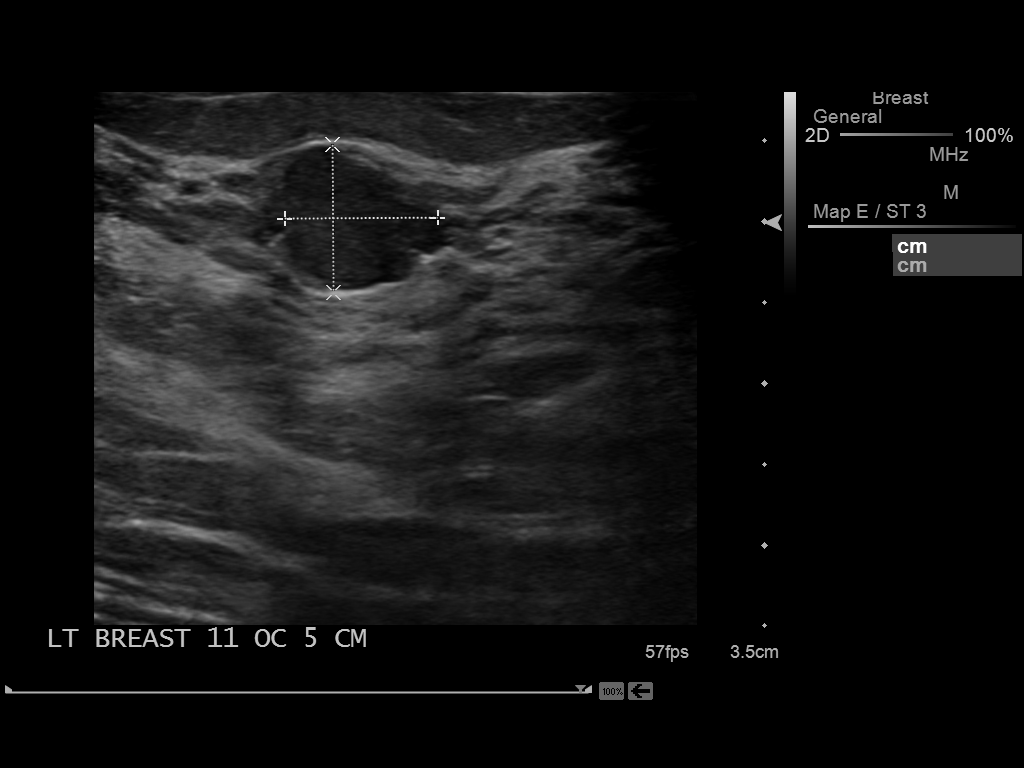
[im 3/4]
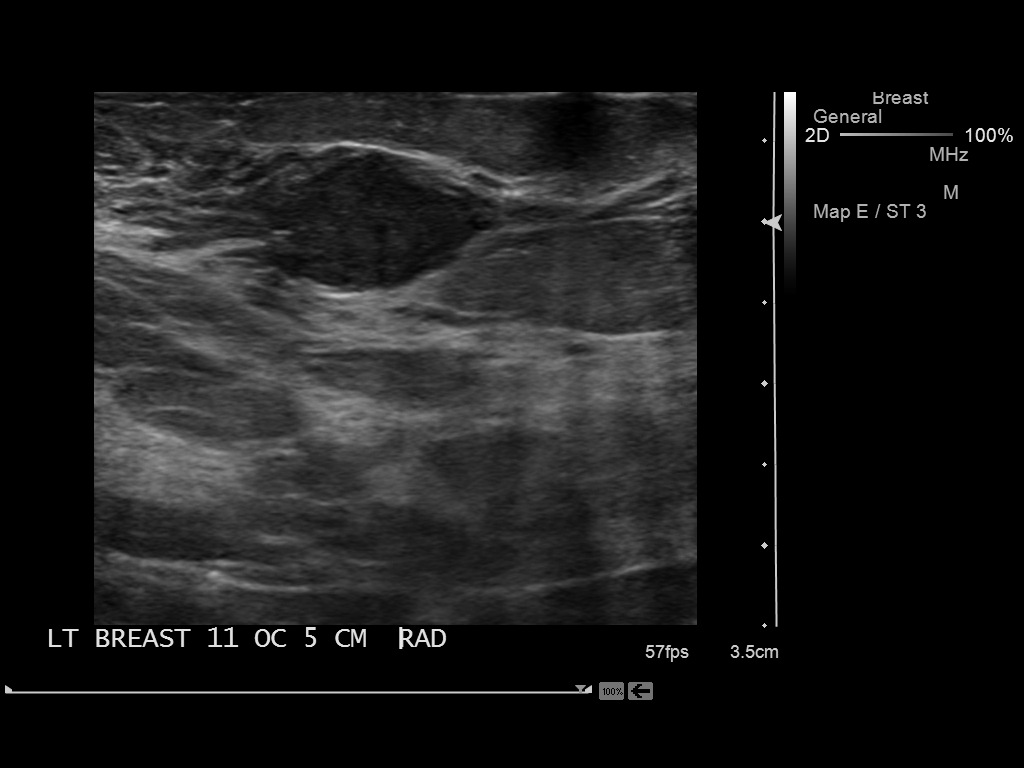
[im 4/4]
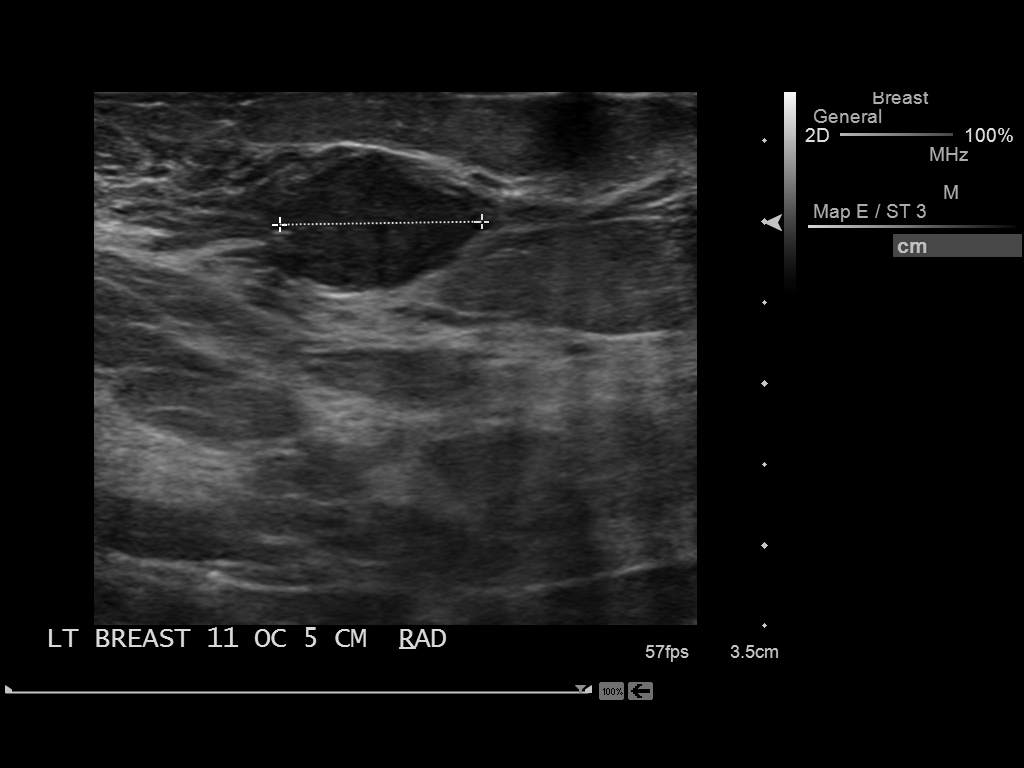

[4 of 4 positions shown; findings below may reference images not displayed]

On physical exam, I palpate a freely mobile mass in the left breast
11 o'clock 5 cm from the nipple.
FINDINGS: Ultrasound is performed, showing there is a well-
circumscribed, hypoechoic mass with increased through transmission
mass in the left breast at 11 o'clock 5 cm from the nipple.  It
measures 1.0 x 0.9 x 1.3 cm.
IMPRESSION: Probable fibroadenoma in the left breast.  Short-term interval
follow-up ultrasound in 6 months, ultrasound-guided core biopsy and
surgical excision were discussed with the patient. The importance
of self breast examination was discussed with the patient.

RECOMMENDATION:
Short-term interval follow-up ultrasound of the left breast in 6
months is recommended.

I have discussed the findings and recommendations with the patient.
Results were also provided in writing at the conclusion of the
visit.

BI-RADS CATEGORY 3:  Probably benign finding(s) - short interval
follow-up suggested.

## 2014-11-19 ENCOUNTER — Ambulatory Visit: Payer: Self-pay | Admitting: Family

## 2014-12-03 ENCOUNTER — Encounter: Payer: Self-pay | Admitting: Family

## 2014-12-03 ENCOUNTER — Ambulatory Visit (INDEPENDENT_AMBULATORY_CARE_PROVIDER_SITE_OTHER): Payer: Medicaid Other | Admitting: Family

## 2014-12-03 VITALS — BP 117/78 | HR 83 | Temp 97.6°F | Ht 67.0 in | Wt 280.6 lb

## 2014-12-03 DIAGNOSIS — F411 Generalized anxiety disorder: Secondary | ICD-10-CM

## 2014-12-03 DIAGNOSIS — Z0000 Encounter for general adult medical examination without abnormal findings: Secondary | ICD-10-CM | POA: Diagnosis not present

## 2014-12-03 DIAGNOSIS — Z01419 Encounter for gynecological examination (general) (routine) without abnormal findings: Secondary | ICD-10-CM

## 2014-12-03 LAB — POCT UA - MICROSCOPIC ONLY
Bacteria, U Microscopic: NEGATIVE
CASTS, UR, LPF, POC: NEGATIVE
CRYSTALS, UR, HPF, POC: NEGATIVE
Mucus, UA: NEGATIVE
RBC, urine, microscopic: NEGATIVE
YEAST UA: NEGATIVE

## 2014-12-03 LAB — POCT URINALYSIS DIPSTICK
Bilirubin, UA: NEGATIVE
Glucose, UA: NEGATIVE
KETONES UA: NEGATIVE
Nitrite, UA: NEGATIVE
Protein, UA: NEGATIVE
RBC UA: NEGATIVE
SPEC GRAV UA: 1.02
UROBILINOGEN UA: NEGATIVE
pH, UA: 6.5

## 2014-12-03 MED ORDER — ESCITALOPRAM OXALATE 10 MG PO TABS: 10.0000 mg | ORAL_TABLET | Freq: Every day | ORAL | Status: DC

## 2014-12-03 NOTE — Progress Notes (Signed)
Subjective:    Patient ID: Kristi West, female    DOB: Mar 21, 1990, 24 y.o.   MRN: [id number]  HPI PT presents to the office today for CPE with pap. Pt currently not taking any medications at this time. PT states she feels like her libido has decreased since giving birth in December 2014. Pt states she reports painful intercourse. Pt states her anxiety is uncontrolled. Pt states she worries all the time. PT states she worries about a "brain tumor" even though she doesn't believe she has one, but gets headaches at times. Pt states she believes this is from her uncontrolled anxiety.      Review of Systems  Constitutional: Negative.   HENT: Negative.   Eyes: Negative.   Respiratory: Negative.  Negative for shortness of breath.   Cardiovascular: Negative.  Negative for palpitations.  Gastrointestinal: Negative.   Endocrine: Negative.   Genitourinary: Negative.   Musculoskeletal: Negative.   Neurological: Negative.  Negative for headaches.  Hematological: Negative.   Psychiatric/Behavioral: Negative.   All other systems reviewed and are negative.      Objective:   Physical Exam  Constitutional: She is oriented to person, place, and time. She appears well-developed and well-nourished. No distress.  HENT:  Head: Normocephalic and atraumatic.  Right Ear: External ear normal.  Left Ear: External ear normal.  Nose: Nose normal.  Mouth/Throat: Oropharynx is clear and moist.  Eyes: Pupils are equal, round, and reactive to light. Right eye exhibits nystagmus. Left eye exhibits nystagmus.  Neck: Normal range of motion. Neck supple. No thyromegaly present.  Cardiovascular: Normal rate, regular rhythm, normal heart sounds and intact distal pulses.   No murmur heard. Pulmonary/Chest: Effort normal and breath sounds normal. No respiratory distress. She has no wheezes. Right breast exhibits no inverted nipple, no mass, no nipple discharge, no skin change and no tenderness. Left breast exhibits  no inverted nipple, no mass, no nipple discharge, no skin change and no tenderness. Breasts are symmetrical.  Abdominal: Soft. Bowel sounds are normal. She exhibits no distension. There is no tenderness.  Genitourinary: Vagina normal. No vaginal discharge found.  Bimanual exam- no adnexal masses or tenderness, ovaries nonpalpable   Cervix parous and pink- No discharge   Musculoskeletal: Normal range of motion. She exhibits no edema or tenderness.  Neurological: She is alert and oriented to person, place, and time. She has normal reflexes. No cranial nerve deficit.  Skin: Skin is warm and dry.  Psychiatric: She has a normal mood and affect. Her behavior is normal. Judgment and thought content normal.  Vitals reviewed.   BP 117/78 mmHg  Pulse 83  Temp(Src) 97.6 F (36.4 C) (Oral)  Ht _0  (1.702 m)  Wt 280 lb 9.6 oz (127.279 kg)  BMI 43.94 kg/m2  LMP 11/20/2014       Assessment & Plan:  1. Encounter for routine gynecological examination - POCT UA - Microscopic Only - POCT urinalysis dipstick - Urine culture - CMP14+EGFR - Pap IG, CT/NG w/ reflex HPV when ASC-U  2. Annual physical exam - CMP14+EGFR - Lipid panel - Thyroid Panel With TSH - VITAMIN D 25 Hydroxy (Vit-D Deficiency, Fractures) - Pap IG, CT/NG w/ reflex HPV when ASC-U  3. GAD (generalized anxiety disorder) Pt started on Lexapro 10 mg today -Stress management discussed - CMP14+EGFR - escitalopram (LEXAPRO) 10 MG tablet; Take 1 tablet (10 mg total) by mouth daily.  Dispense: 90 tablet; Refill: 3   Continue all meds Labs pending Health Maintenance reviewed Diet  and exercise encouraged RTO 8 weeks for GAD  Evelina Dun, FNP

## 2014-12-03 NOTE — Patient Instructions (Signed)
Health Maintenance, Female Adopting a healthy lifestyle and getting preventive care can go a long way to promote health and wellness. Talk with your health care provider about what schedule of regular examinations is right for you. This is a good chance for you to check in with your provider about disease prevention and staying healthy. In between checkups, there are plenty of things you can do on your own. Experts have done a lot of research about which lifestyle changes and preventive measures are most likely to keep you healthy. Ask your health care provider for more information. WEIGHT AND DIET  Eat a healthy diet  Be sure to include plenty of vegetables, fruits, low-fat dairy products, and lean protein.  Do not eat a lot of foods high in solid fats, added sugars, or salt.  Get regular exercise. This is one of the most important things you can do for your health.  Most adults should exercise for at least 150 minutes each week. The exercise should increase your heart rate and make you sweat (moderate-intensity exercise).  Most adults should also do strengthening exercises at least twice a week. This is in addition to the moderate-intensity exercise.  Maintain a healthy weight  Body mass index (BMI) is a measurement that can be used to identify possible weight problems. It estimates body fat based on height and weight. Your health care provider can help determine your BMI and help you achieve or maintain a healthy weight.  For females 20 years of age and older:   A BMI below 18.5 is considered underweight.  A BMI of 18.5 to 24.9 is normal.  A BMI of 25 to 29.9 is considered overweight.  A BMI of 30 and above is considered obese.  Watch levels of cholesterol and blood lipids  You should start having your blood tested for lipids and cholesterol at 23 years of age, then have this test every 5 years.  You may need to have your cholesterol levels checked more often if:  Your lipid  or cholesterol levels are high.  You are older than 23 years of age.  You are at high risk for heart disease.  CANCER SCREENING   Lung Cancer  Lung cancer screening is recommended for adults 55-80 years old who are at high risk for lung cancer because of a history of smoking.  A yearly low-dose CT scan of the lungs is recommended for people who:  Currently smoke.  Have quit within the past 15 years.  Have at least a 30-pack-year history of smoking. A pack year is smoking an average of one pack of cigarettes a day for 1 year.  Yearly screening should continue until it has been 15 years since you quit.  Yearly screening should stop if you develop a health problem that would prevent you from having lung cancer treatment.  Breast Cancer  Practice breast self-awareness. This means understanding how your breasts normally appear and feel.  It also means doing regular breast self-exams. Let your health care provider know about any changes, no matter how small.  If you are in your 20s or 30s, you should have a clinical breast exam (CBE) by a health care provider every 1-3 years as part of a regular health exam.  If you are 40 or older, have a CBE every year. Also consider having a breast X-ray (mammogram) every year.  If you have a family history of breast cancer, talk to your health care provider about genetic screening.  If you   are at high risk for breast cancer, talk to your health care provider about having an MRI and a mammogram every year.  Breast cancer gene (BRCA) assessment is recommended for women who have family members with BRCA-related cancers. BRCA-related cancers include:  Breast.  Ovarian.  Tubal.  Peritoneal cancers.  Results of the assessment will determine the need for genetic counseling and BRCA1 and BRCA2 testing. Cervical Cancer Your health care provider may recommend that you be screened regularly for cancer of the pelvic organs (ovaries, uterus, and  vagina). This screening involves a pelvic examination, including checking for microscopic changes to the surface of your cervix (Pap test). You may be encouraged to have this screening done every 3 years, beginning at age 21.  For women ages 30-65, health care providers may recommend pelvic exams and Pap testing every 3 years, or they may recommend the Pap and pelvic exam, combined with testing for human papilloma virus (HPV), every 5 years. Some types of HPV increase your risk of cervical cancer. Testing for HPV may also be done on women of any age with unclear Pap test results.  Other health care providers may not recommend any screening for nonpregnant women who are considered low risk for pelvic cancer and who do not have symptoms. Ask your health care provider if a screening pelvic exam is right for you.  If you have had past treatment for cervical cancer or a condition that could lead to cancer, you need Pap tests and screening for cancer for at least 20 years after your treatment. If Pap tests have been discontinued, your risk factors (such as having a new sexual partner) need to be reassessed to determine if screening should resume. Some women have medical problems that increase the chance of getting cervical cancer. In these cases, your health care provider may recommend more frequent screening and Pap tests. Colorectal Cancer  This type of cancer can be detected and often prevented.  Routine colorectal cancer screening usually begins at 23 years of age and continues through 23 years of age.  Your health care provider may recommend screening at an earlier age if you have risk factors for colon cancer.  Your health care provider may also recommend using home test kits to check for hidden blood in the stool.  A small camera at the end of a tube can be used to examine your colon directly (sigmoidoscopy or colonoscopy). This is done to check for the earliest forms of colorectal  cancer.  Routine screening usually begins at age 50.  Direct examination of the colon should be repeated every 5-10 years through 23 years of age. However, you may need to be screened more often if early forms of precancerous polyps or small growths are found. Skin Cancer  Check your skin from head to toe regularly.  Tell your health care provider about any new moles or changes in moles, especially if there is a change in a mole's shape or color.  Also tell your health care provider if you have a mole that is larger than the size of a pencil eraser.  Always use sunscreen. Apply sunscreen liberally and repeatedly throughout the day.  Protect yourself by wearing long sleeves, pants, a wide-brimmed hat, and sunglasses whenever you are outside. HEART DISEASE, DIABETES, AND HIGH BLOOD PRESSURE   High blood pressure causes heart disease and increases the risk of stroke. High blood pressure is more likely to develop in:  People who have blood pressure in the high end   of the normal range (130-139/85-89 mm Hg).  People who are overweight or obese.  People who are African American.  If you are 38-23 years of age, have your blood pressure checked every 3-5 years. If you are 61 years of age or older, have your blood pressure checked every year. You should have your blood pressure measured twice--once when you are at a hospital or clinic, and once when you are not at a hospital or clinic. Record the average of the two measurements. To check your blood pressure when you are not at a hospital or clinic, you can use:  An automated blood pressure machine at a pharmacy.  A home blood pressure monitor.  If you are between 45 years and 39 years old, ask your health care provider if you should take aspirin to prevent strokes.  Have regular diabetes screenings. This involves taking a blood sample to check your fasting blood sugar level.  If you are at a normal weight and have a low risk for diabetes,  have this test once every three years after 23 years of age.  If you are overweight and have a high risk for diabetes, consider being tested at a younger age or more often. PREVENTING INFECTION  Hepatitis B  If you have a higher risk for hepatitis B, you should be screened for this virus. You are considered at high risk for hepatitis B if:  You were born in a country where hepatitis B is common. Ask your health care provider which countries are considered high risk.  Your parents were born in a high-risk country, and you have not been immunized against hepatitis B (hepatitis B vaccine).  You have HIV or AIDS.  You use needles to inject street drugs.  You live with someone who has hepatitis B.  You have had sex with someone who has hepatitis B.  You get hemodialysis treatment.  You take certain medicines for conditions, including cancer, organ transplantation, and autoimmune conditions. Hepatitis C  Blood testing is recommended for:  Everyone born from [birth year] through 1964.  Anyone with known risk factors for hepatitis C. Sexually transmitted infections (STIs)  You should be screened for sexually transmitted infections (STIs) including gonorrhea and chlamydia if:  You are sexually active and are younger than 24 years of age.  You are older than 23 years of age and your health care provider tells you that you are at risk for this type of infection.  Your sexual activity has changed since you were last screened and you are at an increased risk for chlamydia or gonorrhea. Ask your health care provider if you are at risk.  If you do not have HIV, but are at risk, it may be recommended that you take a prescription medicine daily to prevent HIV infection. This is called pre-exposure prophylaxis (PrEP). You are considered at risk if:  You are sexually active and do not regularly use condoms or know the HIV status of your partner(s).  You take drugs by injection.  You are sexually  active with a partner who has HIV. Talk with your health care provider about whether you are at high risk of being infected with HIV. If you choose to begin PrEP, you should first be tested for HIV. You should then be tested every 3 months for as long as you are taking PrEP.  PREGNANCY   If you are premenopausal and you may become pregnant, ask your health care provider about preconception counseling.  If you may  become pregnant, take 400 to 800 micrograms (mcg) of folic acid every day.  If you want to prevent pregnancy, talk to your health care provider about birth control (contraception). OSTEOPOROSIS AND MENOPAUSE   Osteoporosis is a disease in which the bones lose minerals and strength with aging. This can result in serious bone fractures. Your risk for osteoporosis can be identified using a bone density scan.  If you are 61 years of age or older, or if you are at risk for osteoporosis and fractures, ask your health care provider if you should be screened.  Ask your health care provider whether you should take a calcium or vitamin D supplement to lower your risk for osteoporosis.  Menopause may have certain physical symptoms and risks.  Hormone replacement therapy may reduce some of these symptoms and risks. Talk to your health care provider about whether hormone replacement therapy is right for you.  HOME CARE INSTRUCTIONS   Schedule regular health, dental, and eye exams.  Stay current with your immunizations.   Do not use any tobacco products including cigarettes, chewing tobacco, or electronic cigarettes.  If you are pregnant, do not drink alcohol.  If you are breastfeeding, limit how much and how often you drink alcohol.  Limit alcohol intake to no more than 1 drink per day for nonpregnant women. One drink equals 12 ounces of beer, 5 ounces of wine, or 1 ounces of hard liquor.  Do not use street drugs.  Do not share needles.  Ask your health care provider for help if  you need support or information about quitting drugs.  Tell your health care provider if you often feel depressed.  Tell your health care provider if you have ever been abused or do not feel safe at home.   This information is not intended to replace advice given to you by your health care provider. Make sure you discuss any questions you have with your health care provider.   Document Released: 07/24/2010 Document Revised: 01/29/2014 Document Reviewed: 12/10/2012 Elsevier Interactive Patient Education [employer].

## 2014-12-03 NOTE — Addendum Note (Signed)
Addended by: Prescott GumLAND, Makari Portman M on: 12/03/2014 03:34 PM   Modules accepted: Kipp BroodSmartSet

## 2014-12-04 LAB — CMP14+EGFR
ALBUMIN: 4 g/dL (ref 3.5–5.5)
ALK PHOS: 105 IU/L (ref 39–117)
ALT: 17 IU/L (ref 0–32)
AST: 19 IU/L (ref 0–40)
Albumin/Globulin Ratio: 1.3 (ref 1.1–2.5)
BILIRUBIN TOTAL: 0.3 mg/dL (ref 0.0–1.2)
BUN / CREAT RATIO: 21 — AB (ref 8–20)
BUN: 15 mg/dL (ref 6–20)
CO2: 25 mmol/L (ref 18–29)
CREATININE: 0.72 mg/dL (ref 0.57–1.00)
Calcium: 9.1 mg/dL (ref 8.7–10.2)
Chloride: 100 mmol/L (ref 97–106)
GFR calc non Af Amer: 118 mL/min/{1.73_m2} (ref >59)
GFR, EST AFRICAN AMERICAN: 137 mL/min/{1.73_m2} (ref >59)
GLOBULIN, TOTAL: 3.1 g/dL (ref 1.5–4.5)
Glucose: 66 mg/dL (ref 65–99)
Potassium: 4.4 mmol/L (ref 3.5–5.2)
SODIUM: 141 mmol/L (ref 136–144)
TOTAL PROTEIN: 7.1 g/dL (ref 6.0–8.5)

## 2014-12-04 LAB — LIPID PANEL
CHOLESTEROL TOTAL: 180 mg/dL (ref 100–199)
Chol/HDL Ratio: 3.8 ratio units (ref 0.0–4.4)
HDL: 48 mg/dL (ref >39)
LDL CALC: 112 mg/dL — AB (ref 0–99)
TRIGLYCERIDES: 99 mg/dL (ref 0–149)
VLDL CHOLESTEROL CAL: 20 mg/dL (ref 5–40)

## 2014-12-04 LAB — THYROID PANEL WITH TSH
Free Thyroxine Index: 1.9 (ref 1.2–4.9)
T3 UPTAKE RATIO: 21 % — AB (ref 24–39)
T4, Total: 9.2 ug/dL (ref 4.5–12.0)
TSH: 0.403 u[IU]/mL — ABNORMAL LOW (ref 0.450–4.500)

## 2014-12-04 LAB — VITAMIN D 25 HYDROXY (VIT D DEFICIENCY, FRACTURES): Vit D, 25-Hydroxy: 20.9 ng/mL — ABNORMAL LOW (ref 30.0–100.0)

## 2014-12-05 LAB — URINE CULTURE

## 2014-12-07 ENCOUNTER — Other Ambulatory Visit: Payer: Self-pay | Admitting: Family

## 2014-12-07 DIAGNOSIS — E559 Vitamin D deficiency, unspecified: Secondary | ICD-10-CM | POA: Insufficient documentation

## 2014-12-07 LAB — PAP IG, CT-NG, RFX HPV ASCU
Chlamydia, Nuc. Acid Amp: NEGATIVE
GONOCOCCUS BY NUCLEIC ACID AMP: NEGATIVE
PAP SMEAR COMMENT: 0

## 2014-12-07 MED ORDER — VITAMIN D (ERGOCALCIFEROL) 1.25 MG (50000 UNIT) PO CAPS
50000.0000 [IU] | ORAL_CAPSULE | ORAL | Status: DC
Start: 2014-12-07 — End: 2015-01-13

## 2014-12-07 NOTE — Progress Notes (Signed)
Patient informed and voices understanding 

## 2015-01-13 ENCOUNTER — Ambulatory Visit (INDEPENDENT_AMBULATORY_CARE_PROVIDER_SITE_OTHER): Payer: BLUE CROSS/BLUE SHIELD | Admitting: Family Medicine

## 2015-01-13 ENCOUNTER — Encounter: Payer: Self-pay | Admitting: Family Medicine

## 2015-01-13 VITALS — BP 144/87 | HR 93 | Temp 97.9°F | Ht 67.0 in | Wt 273.0 lb

## 2015-01-13 DIAGNOSIS — J020 Streptococcal pharyngitis: Secondary | ICD-10-CM | POA: Diagnosis not present

## 2015-01-13 DIAGNOSIS — J029 Acute pharyngitis, unspecified: Secondary | ICD-10-CM

## 2015-01-13 DIAGNOSIS — N926 Irregular menstruation, unspecified: Secondary | ICD-10-CM | POA: Diagnosis not present

## 2015-01-13 LAB — POCT RAPID STREP A (OFFICE): RAPID STREP A SCREEN: POSITIVE — AB

## 2015-01-13 LAB — POCT URINE PREGNANCY: Preg Test, Ur: POSITIVE — AB

## 2015-01-13 MED ORDER — AZITHROMYCIN 250 MG PO TABS: ORAL_TABLET | ORAL | Status: DC

## 2015-01-13 NOTE — Patient Instructions (Signed)
Great to meet you!  Come back in 4-6 weeks, we should re-check your ear.   Strep Throat Strep throat is an infection of the throat. It is caused by germs. Strep throat spreads from person to person because of coughing, sneezing, or close contact. HOME CARE Medicines  Take over-the-counter and prescription medicines only as told by your doctor.  Take your antibiotic medicine as told by your doctor. Do not stop taking the medicine even if you feel better.  Have family members who also have a sore throat or fever go to a doctor. Eating and Drinking  Do not share food, drinking cups, or personal items.  Try eating soft foods until your sore throat feels better.  Drink enough fluid to keep your pee (urine) clear or pale yellow. General Instructions  Rinse your mouth (gargle) with a salt-water mixture 3-4 times per day or as needed. To make a salt-water mixture, stir -1 tsp of salt into 1 cup of warm water.  Make sure that all people in your house wash their hands well.  Rest.  Stay home from school or work until you have been taking antibiotics for 24 hours.  Keep all follow-up visits as told by your doctor. This is important. GET HELP IF:  Your neck keeps getting bigger.  You get a rash, cough, or earache.  You cough up thick liquid that is green, yellow-brown, or bloody.  You have pain that does not get better with medicine.  Your problems get worse instead of getting better.  You have a fever. GET HELP RIGHT AWAY IF:  You throw up (vomit).  You get a very bad headache.  You neck hurts or it feels stiff.  You have chest pain or you are short of breath.  You have drooling, very bad throat pain, or changes in your voice.  Your neck is swollen or the skin gets red and tender.  Your mouth is dry or you are peeing less than normal.  You keep feeling more tired or it is hard to wake up.  Your joints are red or they hurt.   This information is not intended to  replace advice given to you by your health care provider. Make sure you discuss any questions you have with your health care provider.   Document Released: 06/27/2007 Document Revised: 09/29/2014 Document Reviewed: 05/03/2014 Elsevier Interactive Patient Education [employer] Else[employer] Inc[employer]

## 2015-01-13 NOTE — Progress Notes (Signed)
   HPI  Patient presents today here with concern for pregnancy, headaches, right ear pain, sore throat.  Sore throat Describes severe sore throat over the last 1 week. She also has cough, malaise No dyspnea, or chest pain.  Right ear pain She's had tinnitusfor several months, however the last visit last day she developed right ear pain and a pulsatile sound iin her ear  Headaches Describes frequent R sided headaches with photophobia that were very common a few months ago, no occuring only once a week or so and to a much less severity Responds well top tylenol  Has missed period and had 2 positive pregnancy test at home    PMH: Smoking status noted ROS: Per HPI  Objective: BP 144/87 mmHg  Pulse 93  Temp(Src) 97.9 F (36.6 C) (Oral)  Ht 5\' 7"  (1.702 m)  Wt 273 lb (123.832 kg)  BMI 42.75 kg/m2 Gen: NAD, alert, cooperative with exam HEENT: NCAT, pendular nystagmus, R tm bulging with erythema left TM normal oropharynx with erythema but  tonsilsurgically absent CV: RRR, good S1/S2, no murmur Resp: CTABL, no wheezes, non-labored Ext: No edema, warm Neuro: Alert and oriented, No gross deficits  Assessment and plan:  # strep pharyngitis Supportive care Treating with azithromycin, has cephalosporin allergy   #  Right-sided effusion,possible otitis media Covering with azithromycin, only one day of symptoms. She does have tendernessvand definitely has an effusion I recommended 4-6 week follow-up for repeat ear exam, refer to ENT if effusion is persistent   Orders Placed This Encounter  Procedures  . Culture, Group A Strep  . POCT urine pregnancy  . POCT rapid strep A    Meds ordered this encounter  Medications  . azithromycin (ZITHROMAX) 250 MG tablet    Sig: Take 2 tablets on day 1 and 1 tablet daily after that    Dispense:  6 tablet    Refill:  0    Murtis SinkSam Mercer Stallworth, MD [facility] 01/13/2015, 5:36 PM

## 2015-01-23 NOTE — L&D Delivery Note (Signed)
Delivery Note At 3:49 PM a viable female was delivered via  (Presentation: direct OA).  APGAR: 8,9 ; weight pending. Placenta status: intact, spontaenous.  Cord: 3 vessel. with the following complications: none.  Anesthesia:  epidural Episiotomy: none Lacerations: none Suture Repair: none Est. Blood Loss (mL):  150mL  Mom to postpartum.  Baby to Couplet care / Skin to Skin.  Kristi West 08/31/2015, 4:00 PM

## 2015-01-28 ENCOUNTER — Ambulatory Visit: Payer: Medicaid Other | Admitting: Family

## 2015-02-11 ENCOUNTER — Other Ambulatory Visit: Payer: Self-pay | Admitting: Obstetrics and Gynecology

## 2015-02-11 ENCOUNTER — Other Ambulatory Visit: Payer: BLUE CROSS/BLUE SHIELD

## 2015-02-11 DIAGNOSIS — O3680X0 Pregnancy with inconclusive fetal viability, not applicable or unspecified: Secondary | ICD-10-CM

## 2015-02-14 ENCOUNTER — Ambulatory Visit (INDEPENDENT_AMBULATORY_CARE_PROVIDER_SITE_OTHER): Payer: BLUE CROSS/BLUE SHIELD

## 2015-02-14 DIAGNOSIS — O3680X0 Pregnancy with inconclusive fetal viability, not applicable or unspecified: Secondary | ICD-10-CM

## 2015-02-14 NOTE — Progress Notes (Signed)
[place] 8+6wks,single IUP pos fht 172 bpm,crl 22.26mm,normal ov's bilat

## 2015-02-23 ENCOUNTER — Encounter: Payer: Self-pay | Admitting: Advanced Practice Midwife

## 2015-02-23 ENCOUNTER — Ambulatory Visit (INDEPENDENT_AMBULATORY_CARE_PROVIDER_SITE_OTHER): Payer: BLUE CROSS/BLUE SHIELD | Admitting: Advanced Practice Midwife

## 2015-02-23 VITALS — BP 124/80 | HR 64 | Wt 274.0 lb

## 2015-02-23 DIAGNOSIS — Z3491 Encounter for supervision of normal pregnancy, unspecified, first trimester: Secondary | ICD-10-CM

## 2015-02-23 DIAGNOSIS — Z0283 Encounter for blood-alcohol and blood-drug test: Secondary | ICD-10-CM

## 2015-02-23 DIAGNOSIS — Z3490 Encounter for supervision of normal pregnancy, unspecified, unspecified trimester: Secondary | ICD-10-CM | POA: Insufficient documentation

## 2015-02-23 DIAGNOSIS — Z1389 Encounter for screening for other disorder: Secondary | ICD-10-CM

## 2015-02-23 DIAGNOSIS — O99212 Obesity complicating pregnancy, second trimester: Secondary | ICD-10-CM | POA: Diagnosis not present

## 2015-02-23 DIAGNOSIS — Z3483 Encounter for supervision of other normal pregnancy, third trimester: Secondary | ICD-10-CM | POA: Diagnosis not present

## 2015-02-23 DIAGNOSIS — Z3682 Encounter for antenatal screening for nuchal translucency: Secondary | ICD-10-CM

## 2015-02-23 DIAGNOSIS — Z36 Encounter for antenatal screening of mother: Secondary | ICD-10-CM | POA: Diagnosis not present

## 2015-02-23 DIAGNOSIS — Z3A33 33 weeks gestation of pregnancy: Secondary | ICD-10-CM | POA: Diagnosis not present

## 2015-02-23 DIAGNOSIS — Z3A20 20 weeks gestation of pregnancy: Secondary | ICD-10-CM | POA: Diagnosis not present

## 2015-02-23 DIAGNOSIS — Z331 Pregnant state, incidental: Secondary | ICD-10-CM

## 2015-02-23 DIAGNOSIS — Z369 Encounter for antenatal screening, unspecified: Secondary | ICD-10-CM

## 2015-02-23 NOTE — Patient Instructions (Signed)
Safe Medications in Pregnancy   Acne: Benzoyl Peroxide Salicylic Acid  Backache/Headache: Tylenol: 2 regular strength every 4 hours OR              2 Extra strength every 6 hours  Colds/Coughs/Allergies: Benadryl (alcohol free) 25 mg every 6 hours as needed Breath right strips Claritin Cepacol throat lozenges Chloraseptic throat spray Cold-Eeze- up to three times per day Cough drops, alcohol free Flonase (by prescription only) Guaifenesin Mucinex Robitussin DM (plain only, alcohol free) Saline nasal spray/drops Sudafed (pseudoephedrine) & Actifed ** use only after [redacted] weeks gestation and if you do not have high blood pressure Tylenol Vicks Vaporub Zinc lozenges Zyrtec   Constipation: Colace Ducolax suppositories Fleet enema Glycerin suppositories Metamucil Milk of magnesia Miralax Senokot Smooth move tea  Diarrhea: Kaopectate Imodium A-D  *NO pepto Bismol  Hemorrhoids: Anusol Anusol HC Preparation H Tucks  Indigestion: Tums Maalox Mylanta Zantac  Pepcid  Insomnia: Benadryl (alcohol free) 25mg every 6 hours as needed Tylenol PM Unisom, no Gelcaps  Leg Cramps: Tums MagGel  Nausea/Vomiting:  Bonine Dramamine Emetrol Ginger extract Sea bands Meclizine  Nausea medication to take during pregnancy:  Unisom (doxylamine succinate 25 mg tablets) Take one tablet daily at bedtime. If symptoms are not adequately controlled, the dose can be increased to a maximum recommended dose of two tablets daily (1/2 tablet in the morning, 1/2 tablet mid-afternoon and one at bedtime). Vitamin B6 100mg tablets. Take one tablet twice a day (up to 200 mg per day).  Skin Rashes: Aveeno products Benadryl cream or 25mg every 6 hours as needed Calamine Lotion 1% cortisone cream  Yeast infection: Gyne-lotrimin 7 Monistat 7   **If taking multiple medications, please check labels to avoid duplicating the same active ingredients **take medication as directed on  the label ** Do not exceed 4000 mg of tylenol in 24 hours **Do not take medications that contain aspirin or ibuprofen    First Trimester of Pregnancy The first trimester of pregnancy is from week 1 until the end of week 12 (months 1 through 3). A week after a sperm fertilizes an egg, the egg will implant on the wall of the uterus. This embryo will begin to develop into a baby. Genes from you and your partner are forming the baby. The female genes determine whether the baby is a boy or a girl. At 6-8 weeks, the eyes and face are formed, and the heartbeat can be seen on ultrasound. At the end of 12 weeks, all the baby's organs are formed.  Now that you are pregnant, you will want to do everything you can to have a healthy baby. Two of the most important things are to get good prenatal care and to follow your health care provider's instructions. Prenatal care is all the medical care you receive before the baby's birth. This care will help prevent, find, and treat any problems during the pregnancy and childbirth. BODY CHANGES Your body goes through many changes during pregnancy. The changes vary from woman to woman.   You may gain or lose a couple of pounds at first.  You may feel sick to your stomach (nauseous) and throw up (vomit). If the vomiting is uncontrollable, call your health care provider.  You may tire easily.  You may develop headaches that can be relieved by medicines approved by your health care provider.  You may urinate more often. Painful urination may mean you have a bladder infection.  You may develop heartburn as a result of your   pregnancy.  You may develop constipation because certain hormones are causing the muscles that push waste through your intestines to slow down.  You may develop hemorrhoids or swollen, bulging veins (varicose veins).  Your breasts may begin to grow larger and become tender. Your nipples may stick out more, and the tissue that surrounds them (areola)  may become darker.  Your gums may bleed and may be sensitive to brushing and flossing.  Dark spots or blotches (chloasma, mask of pregnancy) may develop on your face. This will likely fade after the baby is born.  Your menstrual periods will stop.  You may have a loss of appetite.  You may develop cravings for certain kinds of food.  You may have changes in your emotions from day to day, such as being excited to be pregnant or being concerned that something may go wrong with the pregnancy and baby.  You may have more vivid and strange dreams.  You may have changes in your hair. These can include thickening of your hair, rapid growth, and changes in texture. Some women also have hair loss during or after pregnancy, or hair that feels dry or thin. Your hair will most likely return to normal after your baby is born. WHAT TO EXPECT AT YOUR PRENATAL VISITS During a routine prenatal visit:  You will be weighed to make sure you and the baby are growing normally.  Your blood pressure will be taken.  Your abdomen will be measured to track your baby's growth.  The fetal heartbeat will be listened to starting around week 10 or 12 of your pregnancy.  Test results from any previous visits will be discussed. Your health care provider may ask you:  How you are feeling.  If you are feeling the baby move.  If you have had any abnormal symptoms, such as leaking fluid, bleeding, severe headaches, or abdominal cramping.  If you are using any tobacco products, including cigarettes, chewing tobacco, and electronic cigarettes.  If you have any questions. Other tests that may be performed during your first trimester include:  Blood tests to find your blood type and to check for the presence of any previous infections. They will also be used to check for low iron levels (anemia) and Rh antibodies. Later in the pregnancy, blood tests for diabetes will be done along with other tests if problems  develop.  Urine tests to check for infections, diabetes, or protein in the urine.  An ultrasound to confirm the proper growth and development of the baby.  An amniocentesis to check for possible genetic problems.  Fetal screens for spina bifida and Down syndrome.  You may need other tests to make sure you and the baby are doing well.  HIV (human immunodeficiency virus) testing. Routine prenatal testing includes screening for HIV, unless you choose not to have this test. HOME CARE INSTRUCTIONS  Medicines  Follow your health care provider's instructions regarding medicine use. Specific medicines may be either safe or unsafe to take during pregnancy.  Take your prenatal vitamins as directed.  If you develop constipation, try taking a stool softener if your health care provider approves. Diet  Eat regular, well-balanced meals. Choose a variety of foods, such as meat or vegetable-based protein, fish, milk and low-fat dairy products, vegetables, fruits, and whole grain breads and cereals. Your health care provider will help you determine the amount of weight gain that is right for you.  Avoid raw meat and uncooked cheese. These carry germs that can cause   birth defects in the baby.  Eating four or five small meals rather than three large meals a day may help relieve nausea and vomiting. If you start to feel nauseous, eating a few soda crackers can be helpful. Drinking liquids between meals instead of during meals also seems to help nausea and vomiting.  If you develop constipation, eat more high-fiber foods, such as fresh vegetables or fruit and whole grains. Drink enough fluids to keep your urine clear or pale yellow. Activity and Exercise  Exercise only as directed by your health care provider. Exercising will help you:  Control your weight.  Stay in shape.  Be prepared for labor and delivery.  Experiencing pain or cramping in the lower abdomen or low back is a good sign that you  should stop exercising. Check with your health care provider before continuing normal exercises.  Try to avoid standing for long periods of time. Move your legs often if you must stand in one place for a long time.  Avoid heavy lifting.  Wear low-heeled shoes, and practice good posture.  You may continue to have sex unless your health care provider directs you otherwise. Relief of Pain or Discomfort  Wear a good support bra for breast tenderness.   Take warm sitz baths to soothe any pain or discomfort caused by hemorrhoids. Use hemorrhoid cream if your health care provider approves.   Rest with your legs elevated if you have leg cramps or low back pain.  If you develop varicose veins in your legs, wear support hose. Elevate your feet for 15 minutes, 3-4 times a day. Limit salt in your diet. Prenatal Care  Schedule your prenatal visits by the twelfth week of pregnancy. They are usually scheduled monthly at first, then more often in the last 2 months before delivery.  Write down your questions. Take them to your prenatal visits.  Keep all your prenatal visits as directed by your health care provider. Safety  Wear your seat belt at all times when driving.  Make a list of emergency phone numbers, including numbers for family, friends, the hospital, and police and fire departments. General Tips  Ask your health care provider for a referral to a local prenatal education class. Begin classes no later than at the beginning of month 6 of your pregnancy.  Ask for help if you have counseling or nutritional needs during pregnancy. Your health care provider can offer advice or refer you to specialists for help with various needs.  Do not use hot tubs, steam rooms, or saunas.  Do not douche or use tampons or scented sanitary pads.  Do not cross your legs for long periods of time.  Avoid cat litter boxes and soil used by cats. These carry germs that can cause birth defects in the baby  and possibly loss of the fetus by miscarriage or stillbirth.  Avoid all smoking, herbs, alcohol, and medicines not prescribed by your health care provider. Chemicals in these affect the formation and growth of the baby.  Do not use any tobacco products, including cigarettes, chewing tobacco, and electronic cigarettes. If you need help quitting, ask your health care provider. You may receive counseling support and other resources to help you quit.  Schedule a dentist appointment. At home, brush your teeth with a soft toothbrush and be gentle when you floss. SEEK MEDICAL CARE IF:   You have dizziness.  You have mild pelvic cramps, pelvic pressure, or nagging pain in the abdominal area.  You have persistent   nausea, vomiting, or diarrhea.  You have a bad smelling vaginal discharge.  You have pain with urination.  You notice increased swelling in your face, hands, legs, or ankles. SEEK IMMEDIATE MEDICAL CARE IF:   You have a fever.  You are leaking fluid from your vagina.  You have spotting or bleeding from your vagina.  You have severe abdominal cramping or pain.  You have rapid weight gain or loss.  You vomit blood or material that looks like coffee grounds.  You are exposed to German measles and have never had them.  You are exposed to fifth disease or chickenpox.  You develop a severe headache.  You have shortness of breath.  You have any kind of trauma, such as from a fall or a car accident.   This information is not intended to replace advice given to you by your health care provider. Make sure you discuss any questions you have with your health care provider.   Document Released: 01/02/2001 Document Revised: 01/29/2014 Document Reviewed: 11/18/2012 Elsevier Interactive Patient Education 2016 [employer]  

## 2015-02-23 NOTE — Progress Notes (Signed)
  Subjective:    Kristi West is a G2P1001 [redacted]w[redacted]d being seen today for her first obstetrical visit.  Her obstetrical history is significant for SVD at term w/o problems.  Pregnancy history fully reviewed.  Patient reports nausea with PNV--may try gummies or chewables.  Filed Vitals:   02/23/15 1548  BP: 124/80  Pulse: 64  Weight: 274 lb (124.286 kg)    HISTORY: OB History  Gravida Para Term Preterm AB SAB TAB Ectopic Multiple Living  # Outcome Date GA Lbr Len/2nd Weight Sex Delivery Anes PTL Lv  2 Current           1 Term 12/25/12 [redacted]w[redacted]d 03:48 / 00:47 7 lb 0.5 oz (3.189 kg) M Vag-Spont EPI  Y     Past Medical History  Diagnosis Date  . Anemia   . Seizures (HCC)   . Albinism (HCC)   . Heart murmur   . Abnormal Pap smear   . Pregnant 07/07/2012  . Anxiety   . Vaginal Pap smear, abnormal    Past Surgical History  Procedure Laterality Date  . Tonsillectomy    . Wisdom tooth extraction     Family History  Problem Relation Age of Onset  . Cancer Maternal Aunt     stomach  . Cancer Maternal Grandmother     leukemia  . Diabetes Paternal Grandmother   . Stroke Paternal Grandfather   . SIDS Brother      Exam                                           Skin: normal coloration and turgor, no rashes    Neurologic: oriented, normal, normal mood   Extremities: normal strength, tone, and muscle mass   HEENT PERRLA   Mouth/Teeth mucous membranes moist, normal dentition   Neck supple and no masses   Cardiovascular: regular rate and rhythm   Respiratory:  appears well, vitals normal, no respiratory distress, acyanotic   Abdomen: soft, non-tender;  FHR: 160 [place]          Assessment:    Pregnancy: G2P1001 Patient Active Problem List   Diagnosis Date Noted  . Supervision of normal pregnancy 02/23/2015  . Vitamin D deficiency 12/07/2014  . GAD (generalized anxiety disorder) 12/03/2014  . Nystagmus 07/28/2012        Plan:     Initial  labs drawn. Continue prenatal vitamins (may switch to gummies or chewables) Problem list reviewed and updated  Reviewed n/v relief measures and warning s/s to report  Reviewed recommended weight gain based on pre-gravid BMI  Encouraged well-balanced diet Genetic Screening discussed Integrated Screen: requested.  Ultrasound discussed; fetal survey: requested.  Return in about 2 weeks (around 03/09/2015) for LROB, US:NT+1st IT.  CRESENZO-DISHMAN,Sanda Dejoy 02/23/2015

## 2015-02-24 LAB — URINE CULTURE

## 2015-02-25 LAB — GC/CHLAMYDIA PROBE AMP
Chlamydia trachomatis, NAA: NEGATIVE
NEISSERIA GONORRHOEAE BY PCR: NEGATIVE

## 2015-03-02 LAB — PMP SCREEN PROFILE (10S), URINE
AMPHETAMINE SCRN UR: NEGATIVE ng/mL
BARBITURATE SCRN UR: NEGATIVE ng/mL
Benzodiazepine Screen, Urine: NEGATIVE ng/mL
CANNABINOIDS UR QL SCN: NEGATIVE ng/mL
COCAINE(METAB.) SCREEN, URINE: NEGATIVE ng/mL
Creatinine(Crt), U: 281.8 mg/dL (ref 20.0–300.0)
METHADONE SCREEN, URINE: NEGATIVE ng/mL
OPIATE SCRN UR: NEGATIVE ng/mL
Oxycodone+Oxymorphone Ur Ql Scn: NEGATIVE ng/mL
PCP SCRN UR: NEGATIVE ng/mL
PROPOXYPHENE SCREEN: NEGATIVE ng/mL
Ph of Urine: 6 (ref 4.5–8.9)

## 2015-03-02 LAB — URINALYSIS, ROUTINE W REFLEX MICROSCOPIC
Bilirubin, UA: NEGATIVE
GLUCOSE, UA: NEGATIVE
Ketones, UA: NEGATIVE
Leukocytes, UA: NEGATIVE
NITRITE UA: NEGATIVE
PH UA: 6 (ref 5.0–7.5)
RBC, UA: NEGATIVE
Specific Gravity, UA: >=1.03 — AB (ref 1.005–1.030)
Urobilinogen, Ur: 1 mg/dL (ref 0.2–1.0)

## 2015-03-02 LAB — HIV ANTIBODY (ROUTINE TESTING W REFLEX): HIV SCREEN 4TH GENERATION: NONREACTIVE

## 2015-03-02 LAB — SICKLE CELL SCREEN: Sickle Cell Screen: NEGATIVE

## 2015-03-02 LAB — CBC
HEMATOCRIT: 34.7 % (ref 34.0–46.6)
HEMOGLOBIN: 11.6 g/dL (ref 11.1–15.9)
MCH: 28.2 pg (ref 26.6–33.0)
MCHC: 33.4 g/dL (ref 31.5–35.7)
MCV: 84 fL (ref 79–97)
Platelets: 255 10*3/uL (ref 150–379)
RBC: 4.12 x10E6/uL (ref 3.77–5.28)
RDW: 14.5 % (ref 12.3–15.4)
WBC: 9 10*3/uL (ref 3.4–10.8)

## 2015-03-02 LAB — ABO/RH: Rh Factor: POSITIVE

## 2015-03-02 LAB — RPR: RPR: NONREACTIVE

## 2015-03-02 LAB — CYSTIC FIBROSIS MUTATION 97: GENE DIS ANAL CARRIER INTERP BLD/T-IMP: NOT DETECTED

## 2015-03-02 LAB — HEPATITIS B SURFACE ANTIGEN: Hepatitis B Surface Ag: NEGATIVE

## 2015-03-02 LAB — ANTIBODY SCREEN: ANTIBODY SCREEN: NEGATIVE

## 2015-03-02 LAB — VARICELLA ZOSTER ANTIBODY, IGG: Varicella zoster IgG: 2670 index (ref >165)

## 2015-03-02 LAB — RUBELLA SCREEN: Rubella Antibodies, IGG: 5.83 index (ref >0.99)

## 2015-03-03 ENCOUNTER — Telehealth: Payer: Self-pay | Admitting: *Deleted

## 2015-03-03 ENCOUNTER — Encounter: Payer: Self-pay | Admitting: Advanced Practice Midwife

## 2015-03-03 NOTE — Telephone Encounter (Signed)
Spoke with pt. Pt has noticed some bleeding today. She has had no BM and no sex. Spotting is with wiping. She is having some light cramping and she feels bloated. I advised pt to push fluids and lay down. Pt is at work but will lay down on her lunch break. Advised to call back around 2 with an update. Pt voiced understanding. JSY

## 2015-03-03 NOTE — Telephone Encounter (Signed)
Left message @ 5:18pm. JSY

## 2015-03-04 NOTE — Telephone Encounter (Signed)
Spoke with pt. Pt states she is feeling better. JSY

## 2015-03-09 ENCOUNTER — Encounter: Payer: Self-pay | Admitting: Advanced Practice Midwife

## 2015-03-09 ENCOUNTER — Ambulatory Visit (INDEPENDENT_AMBULATORY_CARE_PROVIDER_SITE_OTHER): Payer: BLUE CROSS/BLUE SHIELD | Admitting: Advanced Practice Midwife

## 2015-03-09 ENCOUNTER — Ambulatory Visit (INDEPENDENT_AMBULATORY_CARE_PROVIDER_SITE_OTHER): Payer: BLUE CROSS/BLUE SHIELD

## 2015-03-09 VITALS — BP 114/56 | HR 76 | Wt 268.0 lb

## 2015-03-09 DIAGNOSIS — Z331 Pregnant state, incidental: Secondary | ICD-10-CM

## 2015-03-09 DIAGNOSIS — Z3491 Encounter for supervision of normal pregnancy, unspecified, first trimester: Secondary | ICD-10-CM

## 2015-03-09 DIAGNOSIS — Z3481 Encounter for supervision of other normal pregnancy, first trimester: Secondary | ICD-10-CM

## 2015-03-09 DIAGNOSIS — Z1389 Encounter for screening for other disorder: Secondary | ICD-10-CM

## 2015-03-09 DIAGNOSIS — Z36 Encounter for antenatal screening of mother: Secondary | ICD-10-CM

## 2015-03-09 DIAGNOSIS — Z3682 Encounter for antenatal screening for nuchal translucency: Secondary | ICD-10-CM

## 2015-03-09 DIAGNOSIS — Z3A12 12 weeks gestation of pregnancy: Secondary | ICD-10-CM

## 2015-03-09 LAB — POCT URINALYSIS DIPSTICK
Blood, UA: NEGATIVE
GLUCOSE UA: NEGATIVE
LEUKOCYTES UA: NEGATIVE
NITRITE UA: NEGATIVE
Protein, UA: NEGATIVE

## 2015-03-09 NOTE — Progress Notes (Signed)
G2P1001 [redacted]w[redacted]d Estimated Date of Delivery: 09/20/15  Blood pressure 114/56, pulse 76, weight 268 lb (121.564 kg), last menstrual period 12/14/2014.   BP weight and urine results all reviewed and noted.  Please refer to the obstetrical flow sheet for the fundal height and fetal heart rate documentation:  NT/IT today:  [place] 12+1wks,measurements c/w dates,NB present,NT 1.3mm,crl 65.13mm,normal ov's bilat,fhr 162 bpm,post pl gr 0          Patient denies any bleeding and no rupture of membranes symptoms or regular contractions. Patient is without complaints. All questions were answered.  Orders Placed This Encounter  Procedures  . Maternal Screen, Integrated #1  . POCT urinalysis dipstick    Plan:  Continued routine obstetrical care,   Return in about 4 weeks (around 04/06/2015) for 2nd IT, LROB.

## 2015-03-09 NOTE — Progress Notes (Signed)
[place] 12+1wks,measurements c/w dates,NB present,NT 1.103mm,crl 65.43mm,normal ov's bilat,fhr 162 bpm,post pl gr 0

## 2015-03-11 LAB — MATERNAL SCREEN, INTEGRATED #1
Crown Rump Length: 65.9 mm
GEST. AGE ON COLLECTION DATE: 12.7 wk
Maternal Age at EDD: 24.3 years
NUMBER OF FETUSES: 1
Nuchal Translucency (NT): 1.5 mm
PAPP-A VALUE: 417.3 ng/mL
Weight: 268 [lb_av]

## 2015-04-06 ENCOUNTER — Encounter: Payer: Self-pay | Admitting: Advanced Practice Midwife

## 2015-04-06 ENCOUNTER — Ambulatory Visit (INDEPENDENT_AMBULATORY_CARE_PROVIDER_SITE_OTHER): Payer: BLUE CROSS/BLUE SHIELD | Admitting: Advanced Practice Midwife

## 2015-04-06 VITALS — BP 112/60 | HR 74 | Wt 270.0 lb

## 2015-04-06 DIAGNOSIS — Z1389 Encounter for screening for other disorder: Secondary | ICD-10-CM

## 2015-04-06 DIAGNOSIS — Z363 Encounter for antenatal screening for malformations: Secondary | ICD-10-CM

## 2015-04-06 DIAGNOSIS — Z331 Pregnant state, incidental: Secondary | ICD-10-CM

## 2015-04-06 DIAGNOSIS — Z3482 Encounter for supervision of other normal pregnancy, second trimester: Secondary | ICD-10-CM

## 2015-04-06 DIAGNOSIS — Z3682 Encounter for antenatal screening for nuchal translucency: Secondary | ICD-10-CM

## 2015-04-06 LAB — POCT URINALYSIS DIPSTICK
Blood, UA: NEGATIVE
Glucose, UA: NEGATIVE
Ketones, UA: NEGATIVE
LEUKOCYTES UA: NEGATIVE
NITRITE UA: NEGATIVE

## 2015-04-06 NOTE — Progress Notes (Signed)
G2P1001 3426w1d Estimated Date of Delivery: 09/20/15  Last menstrual period 12/14/2014.   BP weight and urine results all reviewed and noted.  Please refer to the obstetrical flow sheet for the fundal height and fetal heart rate documentation:  Patient reports good fetal movement, denies any bleeding and no rupture of membranes symptoms or regular contractions. Patient is without complaints. All questions were answered.  Orders Placed This Encounter  Procedures  . US OB Comp + 14 Wk    Plan:  Continued routine obstetrical care, 2nd IT  Return in about 3 weeks (around 04/27/2015) for LROB, WG:NFAOZHYS:Anatomy.

## 2015-04-08 LAB — MATERNAL SCREEN, INTEGRATED #2
AFP MOM: 0.69
Alpha-Fetoprotein: 15.5 ng/mL
CROWN RUMP LENGTH: 65.9 mm
DIA MoM: 0.7
DIA VALUE: 88.4 pg/mL
Estriol, Unconjugated: 1 ng/mL
Gest. Age on Collection Date: 12.7 weeks
Gestational Age: 16.7 weeks
HCG MOM: 1.48
HCG VALUE: 28.8 [IU]/mL
MATERNAL AGE AT EDD: 24.3 a
NUCHAL TRANSLUCENCY (NT): 1.5 mm
NUMBER OF FETUSES: 1
Nuchal Translucency MoM: 0.91
PAPP-A MOM: 0.8
PAPP-A VALUE: 417.3 ng/mL
Test Results:: NEGATIVE
WEIGHT: 268 [lb_av]
Weight: 268 [lb_av]
uE3 MoM: 1.28

## 2015-04-12 ENCOUNTER — Encounter: Payer: Self-pay | Admitting: Advanced Practice Midwife

## 2015-04-12 DIAGNOSIS — R87619 Unspecified abnormal cytological findings in specimens from cervix uteri: Secondary | ICD-10-CM | POA: Insufficient documentation

## 2015-04-27 IMAGING — US US BREAST*L*
1 series · 6 of 6 positions shown · non-contrast
Comparison: With priors

CLINICAL DATA: Short-term interval follow-up of a probable benign
fibroadenoma in the left breast.  The patient states that she had a
sugical biopsy in [REDACTED] and the lesion was found to be
benign.  We will request the pathology report.

LEFT BREAST ULTRASOUND

[Series 1: us breast*left* · 0.06mm/px · 6 of 6 slices shown]
[im 1/6]
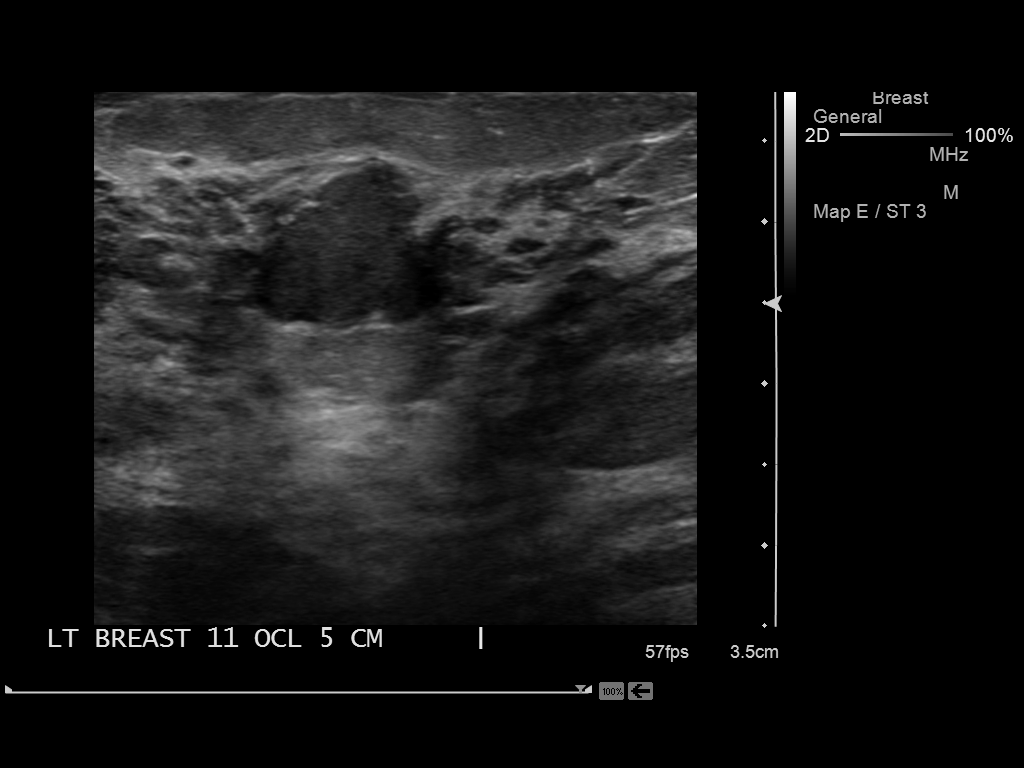
[im 2/6]
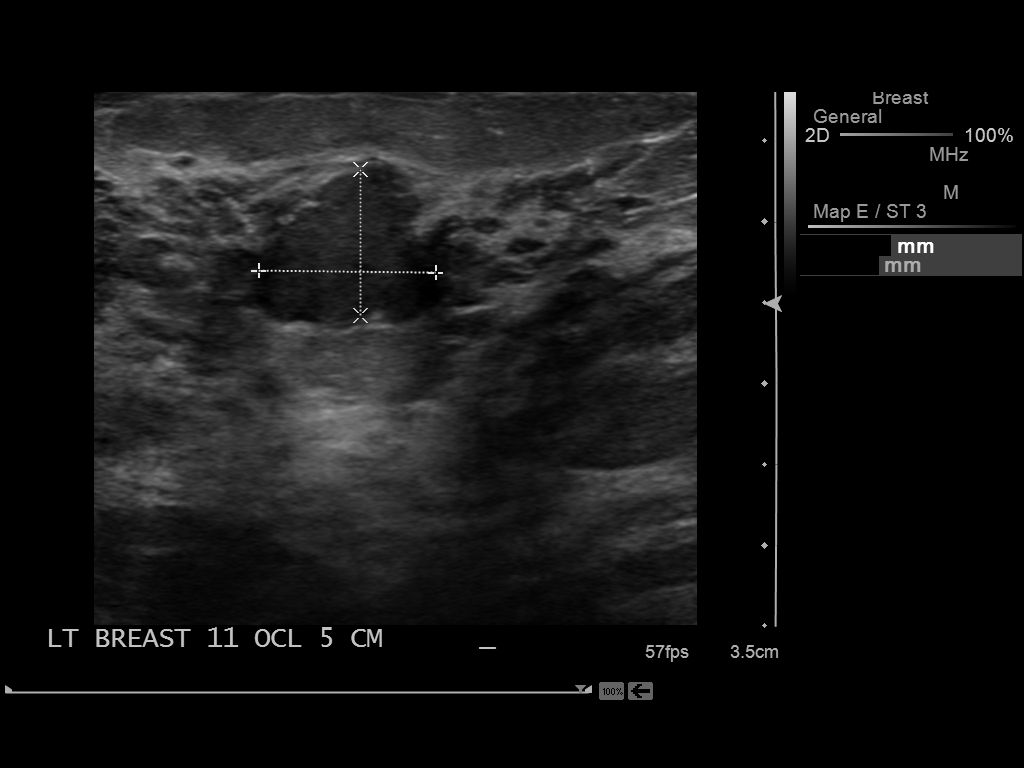
[im 3/6]
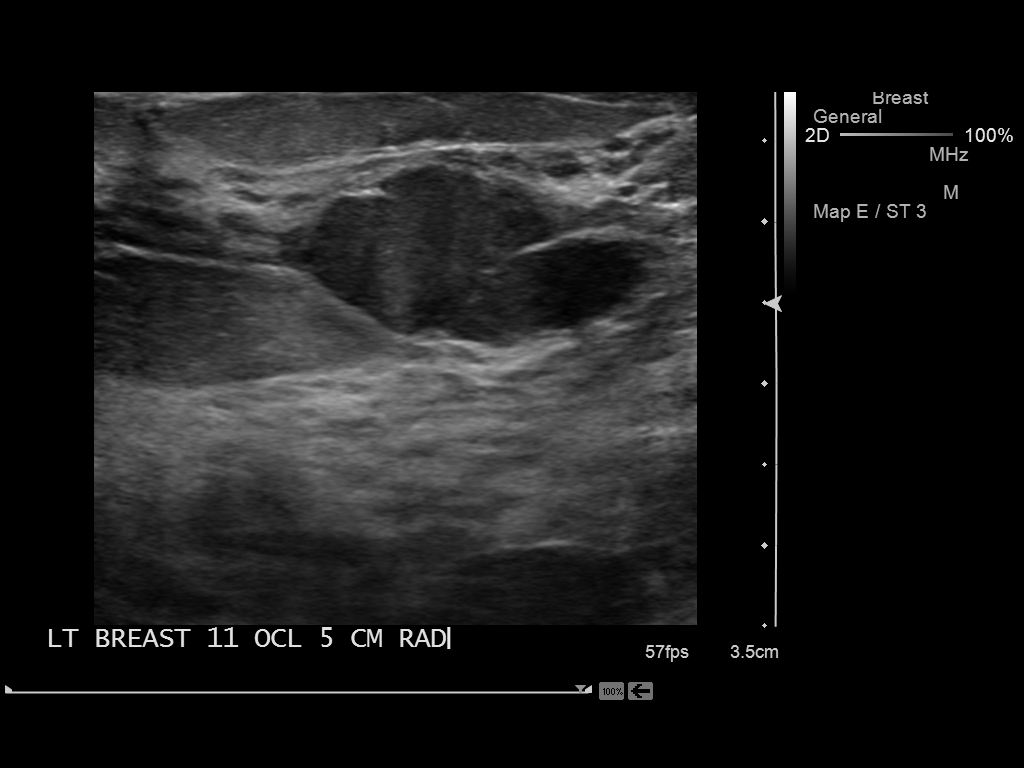
[im 4/6]
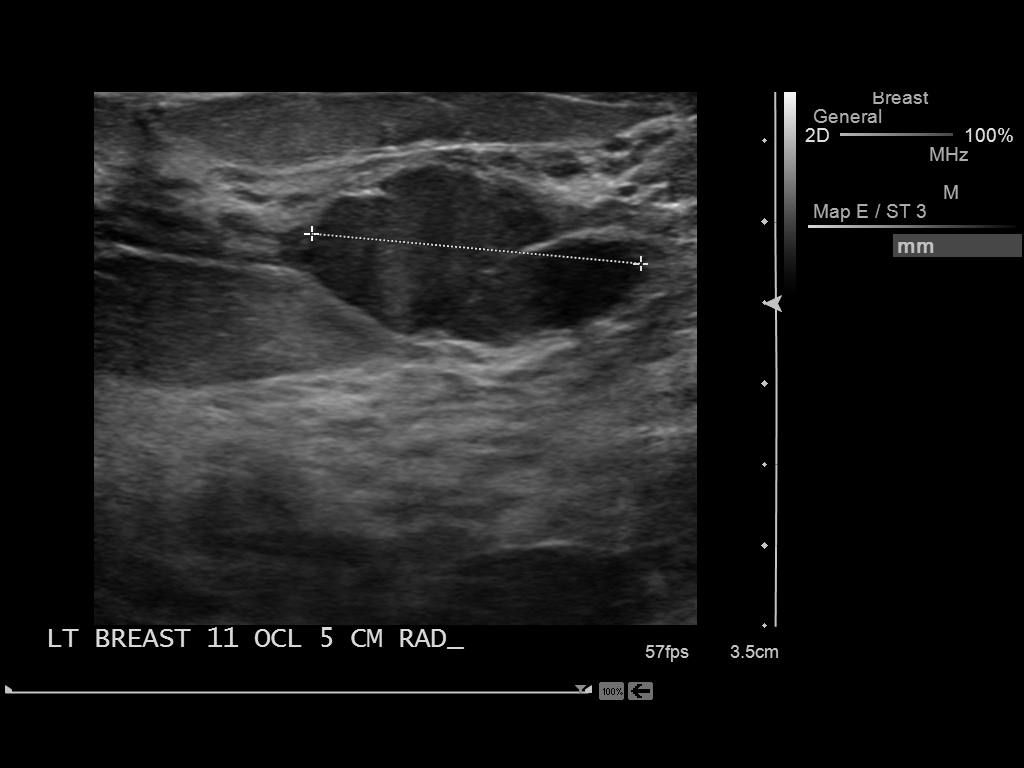
[im 5/6]
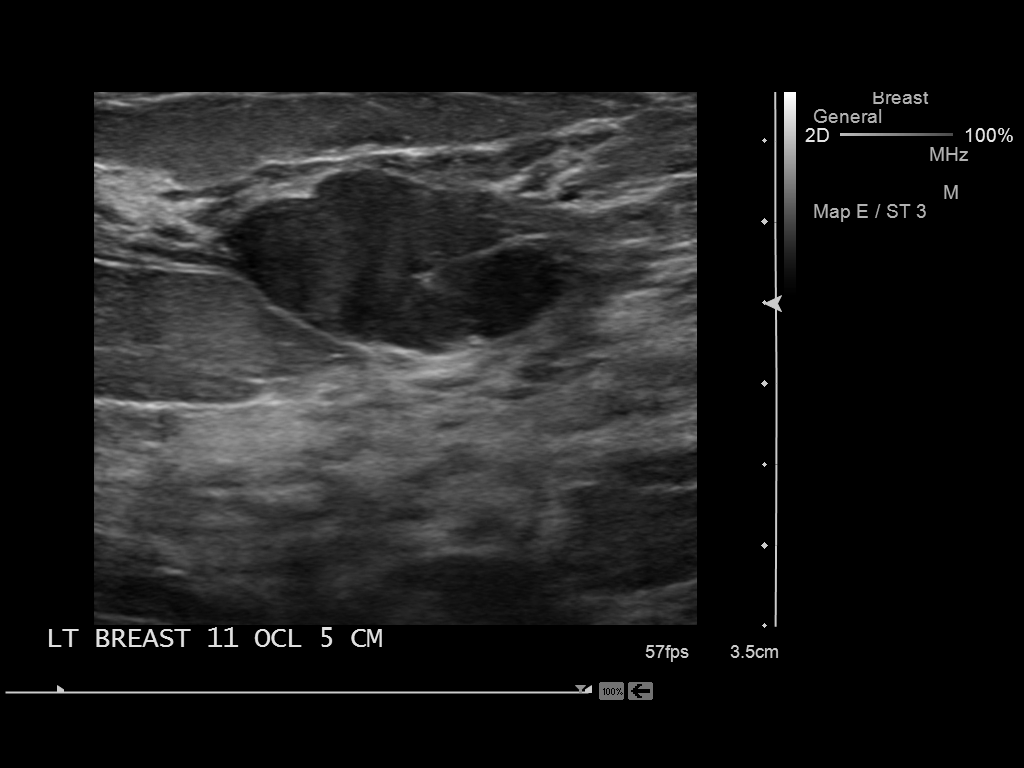
[im 6/6]
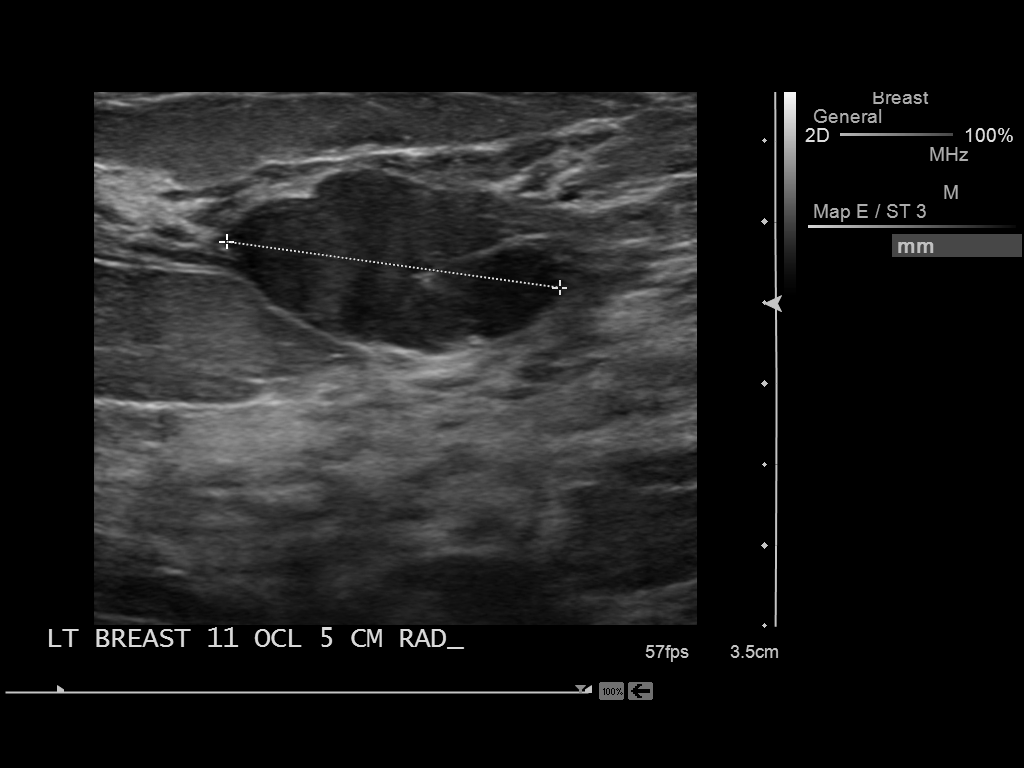

[6 of 6 positions shown; findings below may reference images not displayed]

On physical exam, I palpate a discrete mass in the left breast at
11 o'clock 5 cm from the nipple.
FINDINGS: Ultrasound is performed, showing there is a smoothly
marginated, macro lobulated mass in the left breast at 11 o'clock 5
cm from the nipple measuring 1.1 x 0.9 x 2.1 cm.  On the prior
ultrasound dated 01/18/2012 it measured 1.0 x 0.9 x 1.3 cm.
IMPRESSION: The measurements of the mass in the 11 o'clock region of the left
breast have increased in size since the prior ultrasound but it is
still has benign features and is felt to likely be a benign
fibroadenoma.  The patient states that she had a surgical biopsy
and we will request the pathology report.

RECOMMENDATION:
Probable fibroadenoma in the left breast.  Short-term interval
follow-up ultrasound in 6 months is recommended.

I have discussed the findings and recommendations with the patient.
Results were also provided in writing at the conclusion of the
visit.  If applicable, a reminder letter will be sent to the
patient regarding the next appointment.

BI-RADS CATEGORY 3:  Probably benign finding(s) - short interval
follow-up suggested.

## 2015-04-28 ENCOUNTER — Encounter: Payer: Self-pay | Admitting: Advanced Practice Midwife

## 2015-04-28 ENCOUNTER — Ambulatory Visit (INDEPENDENT_AMBULATORY_CARE_PROVIDER_SITE_OTHER): Payer: BLUE CROSS/BLUE SHIELD | Admitting: Advanced Practice Midwife

## 2015-04-28 ENCOUNTER — Ambulatory Visit (INDEPENDENT_AMBULATORY_CARE_PROVIDER_SITE_OTHER): Payer: BLUE CROSS/BLUE SHIELD

## 2015-04-28 VITALS — BP 120/70 | HR 74 | Wt 273.0 lb

## 2015-04-28 DIAGNOSIS — Z331 Pregnant state, incidental: Secondary | ICD-10-CM

## 2015-04-28 DIAGNOSIS — Z3492 Encounter for supervision of normal pregnancy, unspecified, second trimester: Secondary | ICD-10-CM

## 2015-04-28 DIAGNOSIS — Z3482 Encounter for supervision of other normal pregnancy, second trimester: Secondary | ICD-10-CM

## 2015-04-28 DIAGNOSIS — Z36 Encounter for antenatal screening of mother: Secondary | ICD-10-CM | POA: Diagnosis not present

## 2015-04-28 DIAGNOSIS — O99212 Obesity complicating pregnancy, second trimester: Secondary | ICD-10-CM

## 2015-04-28 DIAGNOSIS — Z363 Encounter for antenatal screening for malformations: Secondary | ICD-10-CM

## 2015-04-28 DIAGNOSIS — Z3A20 20 weeks gestation of pregnancy: Secondary | ICD-10-CM

## 2015-04-28 DIAGNOSIS — Z1389 Encounter for screening for other disorder: Secondary | ICD-10-CM

## 2015-04-28 LAB — POCT URINALYSIS DIPSTICK
GLUCOSE UA: NEGATIVE
Ketones, UA: NEGATIVE
Leukocytes, UA: NEGATIVE
NITRITE UA: NEGATIVE
Protein, UA: NEGATIVE
RBC UA: NEGATIVE

## 2015-04-28 NOTE — Progress Notes (Signed)
US 19+2 wks,cx 3.7cm,cephalic,post pl,svp of fluid 9.6EA,VWUJW6.2cm,bilat adnexa's wnl,fhr 146 bpm,efw 308 g,limited view of heart because of pt body habitus, please have pt come back for additional images,no obvious abnormalities seen,measurements c/w dates.

## 2015-04-28 NOTE — Progress Notes (Signed)
Pt denies any problems or concerns at this time.  

## 2015-04-28 NOTE — Patient Instructions (Signed)

## 2015-04-28 NOTE — Progress Notes (Signed)
G2P1001 5655w2d Estimated Date of Delivery: 09/20/15  Last menstrual period 12/14/2014.   BP weight and urine results all reviewed and noted.  Please refer to the obstetrical flow sheet for the fundal height and fetal heart rate documentation: anatomy scan today:    Patient reports good fetal movement, denies any bleeding and no rupture of membranes symptoms or regular contractions. Patient is without complaints. All questions were answered.  No orders of the defined types were placed in this encounter.    Plan:  Continued routine obstetrical care,   Return in about 4 weeks (around 05/26/2015) for LROB.

## 2015-05-09 ENCOUNTER — Encounter: Payer: Self-pay | Admitting: Nurse Practitioner

## 2015-05-25 ENCOUNTER — Encounter: Payer: Self-pay | Admitting: Women's Health

## 2015-05-25 ENCOUNTER — Ambulatory Visit (INDEPENDENT_AMBULATORY_CARE_PROVIDER_SITE_OTHER): Payer: BLUE CROSS/BLUE SHIELD | Admitting: Women's Health

## 2015-05-25 VITALS — BP 130/80 | HR 80 | Wt 278.0 lb

## 2015-05-25 DIAGNOSIS — Z3492 Encounter for supervision of normal pregnancy, unspecified, second trimester: Secondary | ICD-10-CM

## 2015-05-25 DIAGNOSIS — Z363 Encounter for antenatal screening for malformations: Secondary | ICD-10-CM

## 2015-05-25 DIAGNOSIS — Z1389 Encounter for screening for other disorder: Secondary | ICD-10-CM

## 2015-05-25 DIAGNOSIS — Z331 Pregnant state, incidental: Secondary | ICD-10-CM

## 2015-05-25 LAB — POCT URINALYSIS DIPSTICK
Blood, UA: NEGATIVE
GLUCOSE UA: NEGATIVE
Ketones, UA: NEGATIVE
LEUKOCYTES UA: NEGATIVE
NITRITE UA: NEGATIVE

## 2015-05-25 NOTE — Patient Instructions (Addendum)
You will have your sugar test next visit.  Please do not eat or drink anything after midnight the night before you come, not even water.  You will be here for at least two hours.     Call the office [phone]) or go to [facility] if:  You begin to have strong, frequent contractions  Your water breaks.  Sometimes it is a big gush of fluid, sometimes it is just a trickle that keeps getting your panties wet or running down your legs  You have vaginal bleeding.  It is normal to have a small amount of spotting if your cervix was checked.   You don't feel your baby moving like normal.  If you don't, get you something to eat and drink and lay down and focus on feeling your baby move.   If your baby is still not moving like normal, you should call the office or go to [facility].  For your lower back pain you may:  Purchase a pregnancy belt from [employer], [employer], [employer], etc and wear it while you are up and about  Take warm baths  Use a heating pad to your lower back for no longer than 20 minutes at a time, and do not place near abdomen  Take tylenol as needed. Please follow directions on the bottle   Claritin or Zyrtec for allergies  Second Trimester of Pregnancy The second trimester is from week 13 through week 28, months 4 through 6. The second trimester is often a time when you feel your best. Your body has also adjusted to being pregnant, and you begin to feel better physically. Usually, morning sickness has lessened or quit completely, you may have more energy, and you may have an increase in appetite. The second trimester is also a time when the fetus is growing rapidly. At the end of the sixth month, the fetus is about 9 inches long and weighs about 1 pounds. You will likely begin to feel the baby move (quickening) between 18 and 20 weeks of the pregnancy. BODY CHANGES Your body goes through many changes during pregnancy. The changes vary from woman to woman.    Your weight will continue to increase. You will notice your lower abdomen bulging out.  You may begin to get stretch marks on your hips, abdomen, and breasts.  You may develop headaches that can be relieved by medicines approved by your health care provider.  You may urinate more often because the fetus is pressing on your bladder.  You may develop or continue to have heartburn as a result of your pregnancy.  You may develop constipation because certain hormones are causing the muscles that push waste through your intestines to slow down.  You may develop hemorrhoids or swollen, bulging veins (varicose veins).  You may have back pain because of the weight gain and pregnancy hormones relaxing your joints between the bones in your pelvis and as a result of a shift in weight and the muscles that support your balance.  Your breasts will continue to grow and be tender.  Your gums may bleed and may be sensitive to brushing and flossing.  Dark spots or blotches (chloasma, mask of pregnancy) may develop on your face. This will likely fade after the baby is born.  A dark line from your belly button to the pubic area (linea nigra) may appear. This will likely fade after the baby is born.  You may have changes in your hair. These can  include thickening of your hair, rapid growth, and changes in texture. Some women also have hair loss during or after pregnancy, or hair that feels dry or thin. Your hair will most likely return to normal after your baby is born. WHAT TO EXPECT AT YOUR PRENATAL VISITS During a routine prenatal visit:  You will be weighed to make sure you and the fetus are growing normally.  Your blood pressure will be taken.  Your abdomen will be measured to track your baby's growth.  The fetal heartbeat will be listened to.  Any test results from the previous visit will be discussed. Your health care provider may ask you:  How you are feeling.  If you are feeling the  baby move.  If you have had any abnormal symptoms, such as leaking fluid, bleeding, severe headaches, or abdominal cramping.  If you have any questions. Other tests that may be performed during your second trimester include:  Blood tests that check for:  Low iron levels (anemia).  Gestational diabetes (between 24 and 28 weeks).  Rh antibodies.  Urine tests to check for infections, diabetes, or protein in the urine.  An ultrasound to confirm the proper growth and development of the baby.  An amniocentesis to check for possible genetic problems.  Fetal screens for spina bifida and Down syndrome. HOME CARE INSTRUCTIONS   Avoid all smoking, herbs, alcohol, and unprescribed drugs. These chemicals affect the formation and growth of the baby.  Follow your health care provider's instructions regarding medicine use. There are medicines that are either safe or unsafe to take during pregnancy.  Exercise only as directed by your health care provider. Experiencing uterine cramps is a good sign to stop exercising.  Continue to eat regular, healthy meals.  Wear a good support bra for breast tenderness.  Do not use hot tubs, steam rooms, or saunas.  Wear your seat belt at all times when driving.  Avoid raw meat, uncooked cheese, cat litter boxes, and soil used by cats. These carry germs that can cause birth defects in the baby.  Take your prenatal vitamins.  Try taking a stool softener (if your health care provider approves) if you develop constipation. Eat more high-fiber foods, such as fresh vegetables or fruit and whole grains. Drink plenty of fluids to keep your urine clear or pale yellow.  Take warm sitz baths to soothe any pain or discomfort caused by hemorrhoids. Use hemorrhoid cream if your health care provider approves.  If you develop varicose veins, wear support hose. Elevate your feet for 15 minutes, 3-4 times a day. Limit salt in your diet.  Avoid heavy lifting, wear low  heel shoes, and practice good posture.  Rest with your legs elevated if you have leg cramps or low back pain.  Visit your dentist if you have not gone yet during your pregnancy. Use a soft toothbrush to brush your teeth and be gentle when you floss.  A sexual relationship may be continued unless your health care provider directs you otherwise.  Continue to go to all your prenatal visits as directed by your health care provider. SEEK MEDICAL CARE IF:   You have dizziness.  You have mild pelvic cramps, pelvic pressure, or nagging pain in the abdominal area.  You have persistent nausea, vomiting, or diarrhea.  You have a bad smelling vaginal discharge.  You have pain with urination. SEEK IMMEDIATE MEDICAL CARE IF:   You have a fever.  You are leaking fluid from your vagina.  You  have spotting or bleeding from your vagina.  You have severe abdominal cramping or pain.  You have rapid weight gain or loss.  You have shortness of breath with chest pain.  You notice sudden or extreme swelling of your face, hands, ankles, feet, or legs.  You have not felt your baby move in over an hour.  You have severe headaches that do not go away with medicine.  You have vision changes. Document Released: 01/02/2001 Document Revised: 01/13/2013 Document Reviewed: 03/11/2012 [facility] Patient Information 2015 [place], [place]. This information is not intended to replace advice given to you by your health care provider. Make sure you discuss any questions you have with your health care provider.  Zantac, Prilosec, or Nexium Heartburn During Pregnancy Heartburn is a burning sensation in the chest caused by stomach acid backing up into the esophagus. Heartburn is common in pregnancy because a certain hormone (progesterone) is released when a woman is pregnant. The progesterone hormone may relax the valve that separates the esophagus from the stomach. This allows acid to go up into the esophagus,  causing heartburn. Heartburn may also happen in pregnancy because the enlarging uterus pushes up on the stomach, which pushes more acid into the esophagus. This is especially true in the later stages of pregnancy. Heartburn problems usually go away after giving birth. CAUSES  Heartburn is caused by stomach acid backing up into the esophagus. During pregnancy, this may result from various things, including:   The progesterone hormone.  Changing hormone levels.  The growing uterus pushing stomach acid upward.  Large meals.  Certain foods and drinks.  Exercise.  Increased acid production. SIGNS AND SYMPTOMS   Burning pain in the chest or lower throat.  Bitter taste in the mouth.  Coughing. DIAGNOSIS  Your health care provider will typically diagnose heartburn by taking a careful history of your concern. Blood tests may be done to check for a certain type of bacteria that is associated with heartburn. Sometimes, heartburn is diagnosed by prescribing a heartburn medicine to see if the symptoms improve. In some cases, a procedure called an endoscopy may be done. In this procedure, a tube with a light and a camera on the end (endoscope) is used to examine the esophagus and the stomach. TREATMENT  Treatment will vary depending on the severity of your symptoms. Your health care provider may recommend:  Over-the-counter medicines (antacids, acid reducers) for mild heartburn.  Prescription medicines to decrease stomach acid or to protect your stomach lining.  Certain changes in your diet.  Elevating the head of your bed by putting blocks under the legs. This helps prevent stomach acid from backing up into the esophagus when you are lying down. HOME CARE INSTRUCTIONS   Only take over-the-counter or prescription medicines as directed by your health care provider.  Raise the head of your bed by putting blocks under the legs if instructed to do so by your health care provider. Sleeping with  more pillows is not effective because it only changes the position of your head.  Do not exercise right after eating.  Avoid eating 2-3 hours before bed. Do not lie down right after eating.  Eat small meals throughout the day instead of three large meals.  Identify foods and beverages that make your symptoms worse and avoid them. Foods you may want to avoid include:  Peppers.  Chocolate.  High-fat foods, including fried foods.  Spicy foods.  Garlic and onions.  Citrus fruits, including oranges, grapefruit, lemons, and limes.  Food containing tomatoes or tomato products.  Mint.  Carbonated and caffeinated drinks.  Vinegar. SEEK MEDICAL CARE IF:  You have abdominal pain of any kind.  You feel burning in your upper abdomen or chest, especially after eating or lying down.  You have nausea and vomiting.  Your stomach feels upset after you eat. SEEK IMMEDIATE MEDICAL CARE IF:   You have severe chest pain that goes down your arm or into your jaw or neck.  You feel sweaty, dizzy, or light-headed.  You become short of breath.  You vomit blood.  You have difficulty or pain with swallowing.  You have bloody or black, tarry stools.  You have episodes of heartburn more than 3 times a week, for more than 2 weeks. MAKE SURE YOU:  Understand these instructions.  Will watch your condition.  Will get help right away if you are not doing well or get worse.   This information is not intended to replace advice given to you by your health care provider. Make sure you discuss any questions you have with your health care provider.   Document Released: 01/06/2000 Document Revised: 01/29/2014 Document Reviewed: 08/27/2012 Elsevier Interactive Patient Education [employer]2016 Elsevier Inc.

## 2015-05-25 NOTE — Progress Notes (Signed)
Low-risk OB appointment G2P1001 [id number] Estimated Date of Delivery: 09/20/15 BP 130/80 mmHg  Pulse 80  Wt 278 lb (126.1 kg)  LMP 12/14/2014 (Exact Date)  BP, weight, and urine reviewed.  Refer to obstetrical flow sheet for FH & FHR.  Reports good fm.  Denies regular uc's, lof, vb, or uti s/s. LBP, heartburn, allergies- reviewed relief measures for all Reviewed ptl s/s, fm. Plan:  Continue routine obstetrical care  F/U in 4wks for OB appointment, pn2, and f/u u/s for limited views of heart

## 2015-05-31 ENCOUNTER — Inpatient Hospital Stay (HOSPITAL_COMMUNITY)
Admission: AD | Admit: 2015-05-31 | Discharge: 2015-05-31 | Disposition: A | Discharge status: Home / Self Care | Payer: BLUE CROSS/BLUE SHIELD | Source: Ambulatory Visit | Attending: Obstetrics & Gynecology | Admitting: Obstetrics & Gynecology

## 2015-05-31 ENCOUNTER — Encounter (HOSPITAL_COMMUNITY): Payer: Self-pay

## 2015-05-31 DIAGNOSIS — Z87891 Personal history of nicotine dependence: Secondary | ICD-10-CM | POA: Diagnosis not present

## 2015-05-31 DIAGNOSIS — Z888 Allergy status to other drugs, medicaments and biological substances status: Secondary | ICD-10-CM | POA: Insufficient documentation

## 2015-05-31 DIAGNOSIS — O9989 Other specified diseases and conditions complicating pregnancy, childbirth and the puerperium: Secondary | ICD-10-CM | POA: Diagnosis not present

## 2015-05-31 DIAGNOSIS — Z3482 Encounter for supervision of other normal pregnancy, second trimester: Secondary | ICD-10-CM | POA: Insufficient documentation

## 2015-05-31 DIAGNOSIS — Z91018 Allergy to other foods: Secondary | ICD-10-CM | POA: Diagnosis not present

## 2015-05-31 DIAGNOSIS — R51 Headache: Secondary | ICD-10-CM | POA: Diagnosis not present

## 2015-05-31 DIAGNOSIS — Z3492 Encounter for supervision of normal pregnancy, unspecified, second trimester: Secondary | ICD-10-CM

## 2015-05-31 DIAGNOSIS — G8929 Other chronic pain: Secondary | ICD-10-CM

## 2015-05-31 LAB — URINALYSIS, ROUTINE W REFLEX MICROSCOPIC
Bilirubin Urine: NEGATIVE
GLUCOSE, UA: NEGATIVE mg/dL
Hgb urine dipstick: NEGATIVE
KETONES UR: NEGATIVE mg/dL
LEUKOCYTES UA: NEGATIVE
Nitrite: NEGATIVE
PROTEIN: NEGATIVE mg/dL
Specific Gravity, Urine: 1.025 (ref 1.005–1.030)
pH: 6 (ref 5.0–8.0)

## 2015-05-31 MED ORDER — BUTALBITAL-APAP-CAFFEINE 50-325-40 MG PO TABS: 1.0000 | ORAL_TABLET | Freq: Four times a day (QID) | ORAL | Status: DC | PRN

## 2015-05-31 MED ORDER — ACETAMINOPHEN 500 MG PO TABS
1000.0000 mg | ORAL_TABLET | Freq: Once | ORAL | Status: AC
  Administered 2015-05-31: 1000 mg via ORAL
  Filled 2015-05-31: qty 2

## 2015-05-31 NOTE — MAU Note (Signed)
Have had headaches for months but worse today. Sensitive to light but not sound. Hear pulse in R ear. Feels like electric shocks going thru head on R side. Everything hurting and all is on R side.

## 2015-05-31 NOTE — MAU Provider Note (Signed)
History     CSN: [id number]  Arrival date and time: 05/31/15 1653   First Provider Initiated Contact with Patient 05/31/15 1752      Chief Complaint  Patient presents with  . Headache   HPI Comments: Kristi West is a 25 y.o. G2P1001 at [redacted]w[redacted]d presenting with right-sided headache. This headache began 5 days ago and has been fairly constant.  She's been having similar headaches intermittently since before her pregnancy. Usually gets some relief from Tylenol but did not try any today. She self-discontinued her anti-anxiety med when pregnancy diagnosed. She works FT and has a 24 yo. Has not missed work due to H/A.    Headache  This is a chronic problem. The current episode started in the past 7 days. The problem occurs intermittently. The problem has been waxing and waning. The pain is located in the right unilateral region. The pain radiates to the right neck. The pain quality is similar to prior headaches. The quality of the pain is described as band-like (throbs at times). The pain is at a severity of 6/10. The pain is moderate. Associated symptoms include tinnitus. Pertinent negatives include no abdominal pain, back pain, dizziness, ear pain, fever, neck pain, numbness, phonophobia, photophobia, seizures, tingling, visual change, vomiting or weakness. Associated symptoms comments: No aura, photosensitivity or N/V. The symptoms are aggravated by emotional stress. She has tried acetaminophen for the symptoms. The treatment provided mild relief.    OB History  Gravida Para Term Preterm AB SAB TAB Ectopic Multiple Living  # Outcome Date GA Lbr Len/2nd Weight Sex Delivery Anes PTL Lv  2 Current           1 Term 12/25/12 [redacted]w[redacted]d 03:48 / 00:47 3.189 kg (7 lb 0.5 oz) M Vag-Spont EPI  Y       Past Medical History  Diagnosis Date  . Anemia   . Seizures (HCC)   . Albinism (HCC)   . Heart murmur   . Abnormal Pap smear   . Pregnant 07/07/2012  . Anxiety   . Vaginal Pap smear,  abnormal     Past Surgical History  Procedure Laterality Date  . Tonsillectomy    . Wisdom tooth extraction      Family History  Problem Relation Age of Onset  . Cancer Maternal Aunt     stomach  . Cancer Maternal Grandmother     leukemia  . Diabetes Paternal Grandmother   . Stroke Paternal Grandfather   . SIDS Brother     Social History  Substance Use Topics  . Smoking status: Former Smoker -- .5 years    Types: Cigarettes  . Smokeless tobacco: Never Used  . Alcohol Use: No     Comment: occa    Allergies:  Allergies  Allergen Reactions  . Ceclor [Cefaclor] Hives  . Other Itching and Swelling    Fresh fruits cause the patient's mouth and throat to itch and swell.  . Peanut-Containing Drug Products Itching and Swelling    All types of nuts cause the patient's mouth and throat to swell and itch.    Prescriptions prior to admission  Medication Sig Dispense Refill Last Dose  . acetaminophen (TYLENOL) 500 MG tablet Take 1,000 mg by mouth every 8 (eight) hours as needed for moderate pain.   Past Week at Unknown time  . flintstones complete (FLINTSTONES) 60 MG chewable tablet Chew 2 tablets by mouth daily.  Past Week at Unknown time    Review of Systems  Constitutional: Negative for fever.  HENT: Positive for tinnitus. Negative for ear discharge and ear pain.   Eyes: Negative for photophobia.  Gastrointestinal: Negative for vomiting and abdominal pain.  Musculoskeletal: Negative for back pain and neck pain.  Neurological: Positive for headaches. Negative for dizziness, tingling, sensory change, focal weakness, seizures, loss of consciousness, weakness and numbness.  Psychiatric/Behavioral: Negative for depression. The patient is nervous/anxious.    Physical Exam   Blood pressure 133/72, pulse 75, temperature 98 F (36.7 C), resp. rate 18, height 5\' 7"  (1.702 m), weight 127.098 kg (280 lb 3.2 oz), last menstrual period 12/14/2014.   EFM  FHR 150, 10 bpm accels, no  significant decels, moderate variability UCs: none  Physical Exam  Vitals reviewed. Constitutional: She is oriented to person, place, and time. She appears well-developed and well-nourished. No distress.  HENT:  Head: Normocephalic.  Bilateral nystagmus (longstanding per pt)  Eyes: Pupils are equal, round, and reactive to light.  Neck: Normal range of motion. Neck supple. No thyromegaly present.  Cardiovascular: Normal rate.   Respiratory: Effort normal.  GI: Soft. There is no tenderness.  Musculoskeletal: Normal range of motion. She exhibits no edema.  Neurological: She is alert and oriented to person, place, and time. She has normal reflexes. She exhibits normal muscle tone. Coordination normal.  grossly intact  Skin: Skin is warm and dry.  Psychiatric: Her behavior is normal.    MAU Course  Procedures While in  [place], tried neck massage while she did neck ROM and gave her acetaminophen 1000mg  po. H/A improved, did not completely resolve (Elects discharge, driving herself back to [place])  MDM Likely tension H/A, but unilateral nature is concerning for migraine and due to chronicity may benefit from H/A Clinic or Neuro referral  Assessment and Plan  Chronic nonintractable headache, unspecified headache type - Plan: Discharge patient  Supervision of normal pregnancy, second trimester FWB by EFM    Medication List    TAKE these medications        acetaminophen 500 MG tablet  Commonly known as:  TYLENOL  Take 1,000 mg by mouth every 8 (eight) hours as needed for moderate pain.     butalbital-acetaminophen-caffeine 50-325-40 MG tablet  Commonly known as:  FIORICET  Take 1-2 tablets by mouth every 6 (six) hours as needed for headache.     flintstones complete 60 MG chewable tablet  Chew 2 tablets by mouth daily.       Follow-up Information    Follow up with [facility]. Call in 1 day.   Why:  Call to move up your prenatal appointment if H/A's continue   Contact  information:   [address] [place] [id number] [phone]        Candis Kabel 05/31/2015, 5:56 PM

## 2015-05-31 NOTE — MAU Note (Signed)
Has been having headaches for month. Today the headache was much worse.

## 2015-05-31 NOTE — Discharge Instructions (Signed)
Tension Headache A tension headache is a feeling of pain, pressure, or aching that is often felt over the front and sides of the head. The pain can be dull, or it can feel tight (constricting). Tension headaches are not normally associated with nausea or vomiting, and they do not get worse with physical activity. Tension headaches can last from 30 minutes to several days. This is the most common type of headache. CAUSES The exact cause of this condition is not known. Tension headaches often begin after stress, anxiety, or depression. Other triggers may include:  Alcohol.  Too much caffeine, or caffeine withdrawal.  Respiratory infections, such as colds, flu, or sinus infections.  Dental problems or teeth clenching.  Fatigue.  Holding your head and neck in the same position for a long period of time, such as while using a computer.  Smoking. SYMPTOMS Symptoms of this condition include:  A feeling of pressure around the head.  Dull, aching head pain.  Pain felt over the front and sides of the head.  Tenderness in the muscles of the head, neck, and shoulders. DIAGNOSIS This condition may be diagnosed based on your symptoms and a physical exam. Tests may be done, such as a CT scan or an MRI of your head. These tests may be done if your symptoms are severe or unusual. TREATMENT This condition may be treated with lifestyle changes and medicines to help relieve symptoms. HOME CARE INSTRUCTIONS Managing Pain  Take over-the-counter and prescription medicines only as told by your health care provider.  Lie down in a dark, quiet room when you have a headache.  If directed, apply ice to the head and neck area:  Put ice in a plastic bag.  Place a towel between your skin and the bag.  Leave the ice on for 20 minutes, 2-3 times per day.  Use a heating pad or a hot shower to apply heat to the head and neck area as told by your health care provider. Eating and Drinking  Eat meals on  a regular schedule.  Limit alcohol use.  Decrease your caffeine intake, or stop using caffeine. General Instructions  Keep all follow-up visits as told by your health care provider. This is important.  Keep a headache journal to help find out what may trigger your headaches. For example, write down:  What you eat and drink.  How much sleep you get.  Any change to your diet or medicines.  Try massage or other relaxation techniques.  Limit stress.  Sit up straight, and avoid tensing your muscles.  Do not use tobacco products, including cigarettes, chewing tobacco, or e-cigarettes. If you need help quitting, ask your health care provider.  Exercise regularly as told by your health care provider.  Get 7-9 hours of sleep, or the amount recommended by your health care provider. SEEK MEDICAL CARE IF:  Your symptoms are not helped by medicine.  You have a headache that is different from what you normally experience.  You have nausea or you vomit.  You have a fever. SEEK IMMEDIATE MEDICAL CARE IF:  Your headache becomes severe.  You have repeated vomiting.  You have a stiff neck.  You have a loss of vision.  You have problems with speech.  You have pain in your eye or ear.  You have muscular weakness or loss of muscle control.  You lose your balance or you have trouble walking.  You feel faint or you pass out.  You have confusion.     This information is not intended to replace advice given to you by your health care provider. Make sure you discuss any questions you have with your health care provider.   Document Released: 01/08/2005 Document Revised: 09/29/2014 Document Reviewed: 05/03/2014 Elsevier Interactive Patient Education 2016 Elsevier Inc.  

## 2015-06-24 ENCOUNTER — Ambulatory Visit (INDEPENDENT_AMBULATORY_CARE_PROVIDER_SITE_OTHER): Payer: BLUE CROSS/BLUE SHIELD

## 2015-06-24 ENCOUNTER — Other Ambulatory Visit: Payer: BLUE CROSS/BLUE SHIELD

## 2015-06-24 ENCOUNTER — Ambulatory Visit (INDEPENDENT_AMBULATORY_CARE_PROVIDER_SITE_OTHER): Payer: BLUE CROSS/BLUE SHIELD | Admitting: Obstetrics and Gynecology

## 2015-06-24 ENCOUNTER — Encounter: Payer: Self-pay | Admitting: Obstetrics and Gynecology

## 2015-06-24 VITALS — BP 120/70 | HR 89 | Wt 278.0 lb

## 2015-06-24 DIAGNOSIS — Z131 Encounter for screening for diabetes mellitus: Secondary | ICD-10-CM | POA: Diagnosis not present

## 2015-06-24 DIAGNOSIS — L299 Pruritus, unspecified: Secondary | ICD-10-CM

## 2015-06-24 DIAGNOSIS — Z0373 Encounter for suspected fetal anomaly ruled out: Secondary | ICD-10-CM

## 2015-06-24 DIAGNOSIS — Z369 Encounter for antenatal screening, unspecified: Secondary | ICD-10-CM

## 2015-06-24 DIAGNOSIS — Z3492 Encounter for supervision of normal pregnancy, unspecified, second trimester: Secondary | ICD-10-CM

## 2015-06-24 DIAGNOSIS — Z1389 Encounter for screening for other disorder: Secondary | ICD-10-CM

## 2015-06-24 DIAGNOSIS — Z331 Pregnant state, incidental: Secondary | ICD-10-CM

## 2015-06-24 DIAGNOSIS — Z363 Encounter for antenatal screening for malformations: Secondary | ICD-10-CM

## 2015-06-24 DIAGNOSIS — Z36 Encounter for antenatal screening of mother: Secondary | ICD-10-CM | POA: Diagnosis not present

## 2015-06-24 LAB — POCT URINALYSIS DIPSTICK
Glucose, UA: NEGATIVE
KETONES UA: NEGATIVE
Leukocytes, UA: NEGATIVE
Nitrite, UA: NEGATIVE
RBC UA: NEGATIVE

## 2015-06-24 NOTE — Progress Notes (Signed)
Follow Up US today at 27+[redacted] weeks GA.  Single, female fetus in a cephalic presentation with FHR 153 bpm. Cervix is closed and measures 3.9 cm. Fluid is subjectively within normal limits with SVP 5.05 cm. Posterior Gr 0 placenta. 4CH, RVOT and LVOT visualized today. LVEIF noted. Anatomy is now complete.  EFW today of 1246 g which is consistent with dating, 68%.

## 2015-06-24 NOTE — Progress Notes (Signed)
G2P1001 7461w3d Estimated Date of Delivery: 09/20/15  Blood pressure 120/70, pulse 89, weight 278 lb (126.1 kg), last menstrual period 12/14/2014.   refer to the ob flow sheet for FH and FHR, also BP, Wt, Urine results:notable for trace of protein, otherwise negative  Patient reports good fetal movement, denies any bleeding and no rupture of membranes symptoms or regular contractions. Pt is here today for her sugar testing. Patient complaints: non-radiating pain to her bilateral inner thighs that causes gait problem due to pain. Pt notes that her bilateral inner thigh pain is at its worst in the morning and eases throughout the day. Denies feeling these same symptoms with her previous child, but reports that she had cramps with her last pregnancy. Pt denies vaginal bleeding/discharge and other symptoms at this time. Pt states that at [redacted] weeks pregnant she had spontaneous [facility] and had labs completed that showed that her bile salts were elevated. Pt reports that she had SVD at 37 weeks with her prior pregnancy. Pt notes that she has just began to itch with her current pregnancy.  Fundal height: 30 cm Fetal heart rate: 154 per US   Questions were answered. Assessment: LROB G2P1001 @ 6361w3d  Plan:  Continued routine obstetrical care  Include bilateral acids labs to rule out cholestasis in pregnancy.  F/u in 4 weeks for routine OB Follow up results by phone  I personally performed the services described in this documentation, which was SCRIBED in my presence. The recorded information has been reviewed and considered accurate. It has been edited as necessary during review. Tilda BurrowFERGUSON,Tyriek Hofman V, MD     By signing my name below, I, Soijett Blue, attest that this documentation has been prepared under the direction and in the presence of Tilda BurrowJohn Lenzie Montesano V, MD. Electronically Signed: Soijett Blue, ED Scribe. 06/24/2015. 10:11 AM.

## 2015-06-24 NOTE — Progress Notes (Signed)
Pt states that she has a questions about her inner thigh area.

## 2015-06-25 LAB — CBC
Hematocrit: 33.5 % — ABNORMAL LOW (ref 34.0–46.6)
Hemoglobin: 11 g/dL — ABNORMAL LOW (ref 11.1–15.9)
MCH: 28.7 pg (ref 26.6–33.0)
MCHC: 32.8 g/dL (ref 31.5–35.7)
MCV: 88 fL (ref 79–97)
PLATELETS: 281 10*3/uL (ref 150–379)
RBC: 3.83 x10E6/uL (ref 3.77–5.28)
RDW: 14.5 % (ref 12.3–15.4)
WBC: 8.6 10*3/uL (ref 3.4–10.8)

## 2015-06-25 LAB — GLUCOSE TOLERANCE, 2 HOURS W/ 1HR
GLUCOSE, 1 HOUR: 96 mg/dL (ref 65–179)
GLUCOSE, FASTING: 79 mg/dL (ref 65–91)
Glucose, 2 hour: 92 mg/dL (ref 65–152)

## 2015-06-25 LAB — HIV ANTIBODY (ROUTINE TESTING W REFLEX): HIV Screen 4th Generation wRfx: NONREACTIVE

## 2015-06-25 LAB — ANTIBODY SCREEN: ANTIBODY SCREEN: NEGATIVE

## 2015-06-25 LAB — RPR: RPR: NONREACTIVE

## 2015-06-28 ENCOUNTER — Encounter: Payer: Self-pay | Admitting: Women's Health

## 2015-06-28 DIAGNOSIS — O283 Abnormal ultrasonic finding on antenatal screening of mother: Secondary | ICD-10-CM | POA: Insufficient documentation

## 2015-07-20 DIAGNOSIS — L299 Pruritus, unspecified: Secondary | ICD-10-CM | POA: Diagnosis not present

## 2015-07-21 ENCOUNTER — Encounter: Payer: Self-pay | Admitting: Advanced Practice Midwife

## 2015-07-21 ENCOUNTER — Ambulatory Visit (INDEPENDENT_AMBULATORY_CARE_PROVIDER_SITE_OTHER): Payer: BLUE CROSS/BLUE SHIELD | Admitting: Advanced Practice Midwife

## 2015-07-21 VITALS — BP 126/82 | HR 86 | Wt 282.5 lb

## 2015-07-21 DIAGNOSIS — Z3483 Encounter for supervision of other normal pregnancy, third trimester: Secondary | ICD-10-CM

## 2015-07-21 DIAGNOSIS — Z1389 Encounter for screening for other disorder: Secondary | ICD-10-CM

## 2015-07-21 DIAGNOSIS — Z3493 Encounter for supervision of normal pregnancy, unspecified, third trimester: Secondary | ICD-10-CM

## 2015-07-21 DIAGNOSIS — Z331 Pregnant state, incidental: Secondary | ICD-10-CM

## 2015-07-21 DIAGNOSIS — Z3A32 32 weeks gestation of pregnancy: Secondary | ICD-10-CM

## 2015-07-21 LAB — POCT URINALYSIS DIPSTICK
Blood, UA: NEGATIVE
Glucose, UA: NEGATIVE
Ketones, UA: NEGATIVE
Leukocytes, UA: NEGATIVE
Nitrite, UA: NEGATIVE

## 2015-07-21 LAB — BILE ACIDS, TOTAL: Bile Acids Total: 4.5 umol/L — ABNORMAL LOW (ref 4.7–24.5)

## 2015-07-21 NOTE — Progress Notes (Signed)
G2P1001 5852w2d Estimated Date of Delivery: 09/20/15  Blood pressure 126/82, pulse 86, weight 282 lb 8 oz (128.141 kg), last menstrual period 12/14/2014.   BP weight and urine results all reviewed and noted.  Please refer to the obstetrical flow sheet for the fundal height and fetal heart rate documentation:  Patient reports good fetal movement, denies any bleeding and no rupture of membranes symptoms or regular contractions. Patient still itching, bile acids normal. Try Aveeno.  All questions were answered.  Orders Placed This Encounter  Procedures  . POCT urinalysis dipstick    Plan:  Continued routine obstetrical care,   Return in about 2 weeks (around 08/04/2015) for LROB.

## 2015-07-21 NOTE — Patient Instructions (Signed)

## 2015-07-21 NOTE — Progress Notes (Signed)
Pt states that she is having a lot of pressure and round ligament pain.

## 2015-08-04 ENCOUNTER — Ambulatory Visit (INDEPENDENT_AMBULATORY_CARE_PROVIDER_SITE_OTHER): Payer: BLUE CROSS/BLUE SHIELD | Admitting: Advanced Practice Midwife

## 2015-08-04 VITALS — BP 110/70 | HR 70 | Wt 284.5 lb

## 2015-08-04 DIAGNOSIS — Z3A33 33 weeks gestation of pregnancy: Secondary | ICD-10-CM | POA: Diagnosis not present

## 2015-08-04 DIAGNOSIS — Z3483 Encounter for supervision of other normal pregnancy, third trimester: Secondary | ICD-10-CM

## 2015-08-04 DIAGNOSIS — Z3493 Encounter for supervision of normal pregnancy, unspecified, third trimester: Secondary | ICD-10-CM

## 2015-08-04 NOTE — Progress Notes (Signed)
G2P1001 7153w2d Estimated Date of Delivery: 09/20/15  Blood pressure 110/70, pulse 70, weight 284 lb 8 oz (129.048 kg), last menstrual period 12/14/2014.   BP weight and urine results all reviewed and noted.  Please refer to the obstetrical flow sheet for the fundal height and fetal heart rate documentation: FH is way off, but I feel it's probably positional  Patient reports good fetal movement, denies any bleeding and no rupture of membranes symptoms or regular contractions. Patient is without complaints. All questions were answered.  No orders of the defined types were placed in this encounter.    Plan:  Continued routine obstetrical care,   Return in about 1 week (around 08/11/2015) for LROB, US:EFW.

## 2015-08-04 NOTE — Patient Instructions (Signed)
Call the office [phone]) or go to [facility] if:  You begin to have strong, frequent contractions  Your water breaks.  Sometimes it is a big gush of fluid, sometimes it is just a trickle that keeps getting your panties wet or running down your legs  You have vaginal bleeding.  It is normal to have a small amount of spotting if your cervix was checked.   You don't feel your baby moving like normal.  If you don't, get you something to eat and drink and lay down and focus on feeling your baby move.  You should feel at least 10 movements in 2 hours.  If you don't, you should call the office or go to [facility].    Tdap Vaccine  It is recommended that you get the Tdap vaccine during the third trimester of EACH pregnancy to help protect your baby from getting pertussis (whooping cough)  27-36 weeks is the BEST time to do this so that you can pass the protection on to your baby. During pregnancy is better than after pregnancy, but if you are unable to get it during pregnancy it will be offered at the hospital.   You can get this vaccine at the health department or your family doctor, and some pharmacies (such as [facility])  Everyone who will be around your baby should also be up-to-date on their vaccines. Adults (who are not pregnant) only need 1 dose of Tdap during adulthood.   Third Trimester of Pregnancy The third trimester is from week 29 through week 42, months 7 through 9. The third trimester is a time when the fetus is growing rapidly. At the end of the ninth month, the fetus is about 20 inches in length and weighs 6-10 pounds.  BODY CHANGES Your body goes through many changes during pregnancy. The changes vary from woman to woman.   Your weight will continue to increase. You can expect to gain 25-35 pounds (11-16 kg) by the end of the pregnancy.  You may begin to get stretch marks on your hips, abdomen, and breasts.  You may urinate more often because the fetus is moving  lower into your pelvis and pressing on your bladder.  You may develop or continue to have heartburn as a result of your pregnancy.  You may develop constipation because certain hormones are causing the muscles that push waste through your intestines to slow down.  You may develop hemorrhoids or swollen, bulging veins (varicose veins).  You may have pelvic pain because of the weight gain and pregnancy hormones relaxing your joints between the bones in your pelvis. Backaches may result from overexertion of the muscles supporting your posture.  You may have changes in your hair. These can include thickening of your hair, rapid growth, and changes in texture. Some women also have hair loss during or after pregnancy, or hair that feels dry or thin. Your hair will most likely return to normal after your baby is born.  Your breasts will continue to grow and be tender. A yellow discharge may leak from your breasts called colostrum.  Your belly button may stick out.  You may feel short of breath because of your expanding uterus.  You may notice the fetus "dropping," or moving lower in your abdomen.  You may have a bloody mucus discharge. This usually occurs a few days to a week before labor begins.  Your cervix becomes thin and soft (effaced) near your due date. WHAT TO EXPECT AT [facility]  PRENATAL EXAMS  You will have prenatal exams every 2 weeks until week 36. Then, you will have weekly prenatal exams. During a routine prenatal visit:  You will be weighed to make sure you and the fetus are growing normally.  Your blood pressure is taken.  Your abdomen will be measured to track your baby's growth.  The fetal heartbeat will be listened to.  Any test results from the previous visit will be discussed.  You may have a cervical check near your due date to see if you have effaced. At around 36 weeks, your caregiver will check your cervix. At the same time, your caregiver will also perform a test on  the secretions of the vaginal tissue. This test is to determine if a type of bacteria, Group B streptococcus, is present. Your caregiver will explain this further. Your caregiver may ask you:  What your birth plan is.  How you are feeling.  If you are feeling the baby move.  If you have had any abnormal symptoms, such as leaking fluid, bleeding, severe headaches, or abdominal cramping.  If you have any questions. Other tests or screenings that may be performed during your third trimester include:  Blood tests that check for low iron levels (anemia).  Fetal testing to check the health, activity level, and growth of the fetus. Testing is done if you have certain medical conditions or if there are problems during the pregnancy. FALSE LABOR You may feel small, irregular contractions that eventually go away. These are called Braxton Hicks contractions, or false labor. Contractions may last for hours, days, or even weeks before true labor sets in. If contractions come at regular intervals, intensify, or become painful, it is best to be seen by your caregiver.  SIGNS OF LABOR   Menstrual-like cramps.  Contractions that are 5 minutes apart or less.  Contractions that start on the top of the uterus and spread down to the lower abdomen and back.  A sense of increased pelvic pressure or back pain.  A watery or bloody mucus discharge that comes from the vagina. If you have any of these signs before the 37th week of pregnancy, call your caregiver right away. You need to go to the hospital to get checked immediately. HOME CARE INSTRUCTIONS   Avoid all smoking, herbs, alcohol, and unprescribed drugs. These chemicals affect the formation and growth of the baby.  Follow your caregiver's instructions regarding medicine use. There are medicines that are either safe or unsafe to take during pregnancy.  Exercise only as directed by your caregiver. Experiencing uterine cramps is a good sign to stop  exercising.  Continue to eat regular, healthy meals.  Wear a good support bra for breast tenderness.  Do not use hot tubs, steam rooms, or saunas.  Wear your seat belt at all times when driving.  Avoid raw meat, uncooked cheese, cat litter boxes, and soil used by cats. These carry germs that can cause birth defects in the baby.  Take your prenatal vitamins.  Try taking a stool softener (if your caregiver approves) if you develop constipation. Eat more high-fiber foods, such as fresh vegetables or fruit and whole grains. Drink plenty of fluids to keep your urine clear or pale yellow.  Take warm sitz baths to soothe any pain or discomfort caused by hemorrhoids. Use hemorrhoid cream if your caregiver approves.  If you develop varicose veins, wear support hose. Elevate your feet for 15 minutes, 3-4 times a day. Limit salt in your diet.  Avoid heavy lifting, wear low heal shoes, and practice good posture.  Rest a lot with your legs elevated if you have leg cramps or low back pain.  Visit your dentist if you have not gone during your pregnancy. Use a soft toothbrush to brush your teeth and be gentle when you floss.  A sexual relationship may be continued unless your caregiver directs you otherwise.  Do not travel far distances unless it is absolutely necessary and only with the approval of your caregiver.  Take prenatal classes to understand, practice, and ask questions about the labor and delivery.  Make a trial run to the hospital.  Pack your hospital bag.  Prepare the baby's nursery.  Continue to go to all your prenatal visits as directed by your caregiver. SEEK MEDICAL CARE IF:  You are unsure if you are in labor or if your water has broken.  You have dizziness.  You have mild pelvic cramps, pelvic pressure, or nagging pain in your abdominal area.  You have persistent nausea, vomiting, or diarrhea.  You have a bad smelling vaginal discharge.  You have pain with  urination. SEEK IMMEDIATE MEDICAL CARE IF:   You have a fever.  You are leaking fluid from your vagina.  You have spotting or bleeding from your vagina.  You have severe abdominal cramping or pain.  You have rapid weight loss or gain.  You have shortness of breath with chest pain.  You notice sudden or extreme swelling of your face, hands, ankles, feet, or legs.  You have not felt your baby move in over an hour.  You have severe headaches that do not go away with medicine.  You have vision changes. Document Released: 01/02/2001 Document Revised: 01/13/2013 Document Reviewed: 03/11/2012 [facility] Patient Information 2015 [employer]. This information is not intended to replace advice given to you by your health care provider. Make sure you discuss any questions you have with your health care provider.

## 2015-08-10 ENCOUNTER — Other Ambulatory Visit: Payer: Self-pay | Admitting: Advanced Practice Midwife

## 2015-08-10 DIAGNOSIS — O26843 Uterine size-date discrepancy, third trimester: Secondary | ICD-10-CM

## 2015-08-11 ENCOUNTER — Ambulatory Visit (INDEPENDENT_AMBULATORY_CARE_PROVIDER_SITE_OTHER): Payer: BLUE CROSS/BLUE SHIELD | Admitting: Obstetrics & Gynecology

## 2015-08-11 ENCOUNTER — Encounter: Payer: Self-pay | Admitting: Obstetrics & Gynecology

## 2015-08-11 ENCOUNTER — Ambulatory Visit (INDEPENDENT_AMBULATORY_CARE_PROVIDER_SITE_OTHER): Payer: BLUE CROSS/BLUE SHIELD

## 2015-08-11 VITALS — BP 110/80 | HR 78 | Wt 280.0 lb

## 2015-08-11 DIAGNOSIS — Z331 Pregnant state, incidental: Secondary | ICD-10-CM

## 2015-08-11 DIAGNOSIS — Z3493 Encounter for supervision of normal pregnancy, unspecified, third trimester: Secondary | ICD-10-CM

## 2015-08-11 DIAGNOSIS — O26843 Uterine size-date discrepancy, third trimester: Secondary | ICD-10-CM

## 2015-08-11 DIAGNOSIS — Z3A35 35 weeks gestation of pregnancy: Secondary | ICD-10-CM | POA: Diagnosis not present

## 2015-08-11 DIAGNOSIS — Z3483 Encounter for supervision of other normal pregnancy, third trimester: Secondary | ICD-10-CM

## 2015-08-11 DIAGNOSIS — Z029 Encounter for administrative examinations, unspecified: Secondary | ICD-10-CM

## 2015-08-11 DIAGNOSIS — Z1389 Encounter for screening for other disorder: Secondary | ICD-10-CM

## 2015-08-11 LAB — POCT URINALYSIS DIPSTICK
Blood, UA: NEGATIVE
Glucose, UA: NEGATIVE
LEUKOCYTES UA: NEGATIVE
Nitrite, UA: NEGATIVE
Protein, UA: 1

## 2015-08-11 NOTE — Progress Notes (Signed)
G2P1001 2370w2d Estimated Date of Delivery: 09/20/15  Blood pressure 110/80, pulse 78, weight 280 lb (127.007 kg), last menstrual period 12/14/2014.   BP weight and urine results all reviewed and noted.  Please refer to the obstetrical flow sheet for the fundal height and fetal heart rate documentation:  Patient reports good fetal movement, denies any bleeding and no rupture of membranes symptoms or regular contractions. Patient is without complaints. All questions were answered.  Orders Placed This Encounter  Procedures  . POCT urinalysis dipstick    Plan:  Continued routine obstetrical care, sonogram EFW 58%  No Follow-up on file.

## 2015-08-11 NOTE — Progress Notes (Signed)
US 34+2 wks,cephalic,afi 14 cm,post pl gr 1,fhr 141 bpm,efw 2581 g 58%

## 2015-08-25 ENCOUNTER — Encounter: Payer: BLUE CROSS/BLUE SHIELD | Admitting: Advanced Practice Midwife

## 2015-08-25 DIAGNOSIS — Z3483 Encounter for supervision of other normal pregnancy, third trimester: Secondary | ICD-10-CM | POA: Diagnosis not present

## 2015-08-25 DIAGNOSIS — Z91018 Allergy to other foods: Secondary | ICD-10-CM | POA: Diagnosis not present

## 2015-08-25 DIAGNOSIS — E7039 Other specified albinism: Secondary | ICD-10-CM | POA: Diagnosis not present

## 2015-08-25 DIAGNOSIS — I517 Cardiomegaly: Secondary | ICD-10-CM | POA: Diagnosis not present

## 2015-08-25 DIAGNOSIS — O26893 Other specified pregnancy related conditions, third trimester: Secondary | ICD-10-CM | POA: Diagnosis not present

## 2015-08-25 DIAGNOSIS — Z3A36 36 weeks gestation of pregnancy: Secondary | ICD-10-CM | POA: Diagnosis not present

## 2015-08-25 DIAGNOSIS — I4891 Unspecified atrial fibrillation: Secondary | ICD-10-CM | POA: Diagnosis not present

## 2015-08-25 DIAGNOSIS — R002 Palpitations: Secondary | ICD-10-CM | POA: Diagnosis not present

## 2015-08-25 DIAGNOSIS — Z881 Allergy status to other antibiotic agents status: Secondary | ICD-10-CM | POA: Diagnosis not present

## 2015-08-25 DIAGNOSIS — I472 Ventricular tachycardia: Secondary | ICD-10-CM | POA: Diagnosis not present

## 2015-08-25 DIAGNOSIS — O99413 Diseases of the circulatory system complicating pregnancy, third trimester: Secondary | ICD-10-CM | POA: Diagnosis not present

## 2015-08-25 DIAGNOSIS — Z3A35 35 weeks gestation of pregnancy: Secondary | ICD-10-CM | POA: Diagnosis not present

## 2015-08-26 DIAGNOSIS — I4891 Unspecified atrial fibrillation: Secondary | ICD-10-CM | POA: Diagnosis not present

## 2015-08-26 DIAGNOSIS — E7039 Other specified albinism: Secondary | ICD-10-CM | POA: Diagnosis not present

## 2015-08-26 DIAGNOSIS — O26893 Other specified pregnancy related conditions, third trimester: Secondary | ICD-10-CM | POA: Diagnosis not present

## 2015-08-26 DIAGNOSIS — Z3A35 35 weeks gestation of pregnancy: Secondary | ICD-10-CM | POA: Diagnosis not present

## 2015-08-26 DIAGNOSIS — Z3A36 36 weeks gestation of pregnancy: Secondary | ICD-10-CM | POA: Diagnosis not present

## 2015-08-27 DIAGNOSIS — E7039 Other specified albinism: Secondary | ICD-10-CM | POA: Diagnosis not present

## 2015-08-27 DIAGNOSIS — Z3A36 36 weeks gestation of pregnancy: Secondary | ICD-10-CM | POA: Diagnosis not present

## 2015-08-27 DIAGNOSIS — O26893 Other specified pregnancy related conditions, third trimester: Secondary | ICD-10-CM | POA: Diagnosis not present

## 2015-08-27 DIAGNOSIS — I4891 Unspecified atrial fibrillation: Secondary | ICD-10-CM | POA: Diagnosis not present

## 2015-08-29 ENCOUNTER — Encounter (HOSPITAL_COMMUNITY): Payer: Self-pay | Admitting: *Deleted

## 2015-08-29 ENCOUNTER — Inpatient Hospital Stay (HOSPITAL_COMMUNITY)
Admission: AD | Admit: 2015-08-29 | Discharge: 2015-08-30 | DRG: 778 | Disposition: A | Discharge status: Home / Self Care | Payer: BLUE CROSS/BLUE SHIELD | Source: Ambulatory Visit | Attending: Obstetrics & Gynecology | Admitting: Obstetrics & Gynecology

## 2015-08-29 DIAGNOSIS — Z833 Family history of diabetes mellitus: Secondary | ICD-10-CM

## 2015-08-29 DIAGNOSIS — O99343 Other mental disorders complicating pregnancy, third trimester: Secondary | ICD-10-CM | POA: Diagnosis not present

## 2015-08-29 DIAGNOSIS — Z87891 Personal history of nicotine dependence: Secondary | ICD-10-CM | POA: Diagnosis not present

## 2015-08-29 DIAGNOSIS — F411 Generalized anxiety disorder: Secondary | ICD-10-CM | POA: Diagnosis not present

## 2015-08-29 DIAGNOSIS — IMO0001 Reserved for inherently not codable concepts without codable children: Secondary | ICD-10-CM

## 2015-08-29 DIAGNOSIS — I4891 Unspecified atrial fibrillation: Secondary | ICD-10-CM | POA: Diagnosis present

## 2015-08-29 DIAGNOSIS — Z806 Family history of leukemia: Secondary | ICD-10-CM | POA: Diagnosis not present

## 2015-08-29 DIAGNOSIS — O479 False labor, unspecified: Secondary | ICD-10-CM

## 2015-08-29 DIAGNOSIS — Z881 Allergy status to other antibiotic agents status: Secondary | ICD-10-CM

## 2015-08-29 DIAGNOSIS — Z823 Family history of stroke: Secondary | ICD-10-CM | POA: Diagnosis not present

## 2015-08-29 DIAGNOSIS — E559 Vitamin D deficiency, unspecified: Secondary | ICD-10-CM | POA: Diagnosis not present

## 2015-08-29 DIAGNOSIS — Z3A36 36 weeks gestation of pregnancy: Secondary | ICD-10-CM

## 2015-08-29 DIAGNOSIS — Z3493 Encounter for supervision of normal pregnancy, unspecified, third trimester: Secondary | ICD-10-CM

## 2015-08-29 DIAGNOSIS — O99413 Diseases of the circulatory system complicating pregnancy, third trimester: Secondary | ICD-10-CM | POA: Diagnosis present

## 2015-08-29 DIAGNOSIS — Z7982 Long term (current) use of aspirin: Secondary | ICD-10-CM | POA: Diagnosis not present

## 2015-08-29 HISTORY — DX: Unspecified atrial fibrillation: I48.91

## 2015-08-29 LAB — CBC
HEMATOCRIT: 33.3 % — AB (ref 36.0–46.0)
HEMOGLOBIN: 11.1 g/dL — AB (ref 12.0–15.0)
MCH: 28 pg (ref 26.0–34.0)
MCHC: 33.3 g/dL (ref 30.0–36.0)
MCV: 84.1 fL (ref 78.0–100.0)
Platelets: 313 10*3/uL (ref 150–400)
RBC: 3.96 MIL/uL (ref 3.87–5.11)
RDW: 14.4 % (ref 11.5–15.5)
WBC: 11.1 10*3/uL — AB (ref 4.0–10.5)

## 2015-08-29 LAB — TYPE AND SCREEN
ABO/RH(D): O POS
Antibody Screen: NEGATIVE

## 2015-08-29 LAB — AMNISURE RUPTURE OF MEMBRANE (ROM) NOT AT ARMC: Amnisure ROM: NEGATIVE

## 2015-08-29 LAB — OB RESULTS CONSOLE GBS: GBS: NEGATIVE

## 2015-08-29 MED ORDER — PENICILLIN G POTASSIUM 5000000 UNITS IJ SOLR: 5.0000 10*6.[IU] | Freq: Once | INTRAMUSCULAR | Status: DC

## 2015-08-29 MED ORDER — ACETAMINOPHEN 325 MG PO TABS: 650.0000 mg | ORAL_TABLET | ORAL | Status: DC | PRN

## 2015-08-29 MED ORDER — LIDOCAINE HCL (PF) 1 % IJ SOLN: 30.0000 mL | INTRAMUSCULAR | Status: DC | PRN

## 2015-08-29 MED ORDER — OXYTOCIN BOLUS FROM INFUSION: 500.0000 mL | Freq: Once | INTRAVENOUS | Status: DC

## 2015-08-29 MED ORDER — ASPIRIN 81 MG PO CHEW
81.0000 mg | CHEWABLE_TABLET | Freq: Every day | ORAL | Status: DC
  Administered 2015-08-29: 81 mg via ORAL
  Filled 2015-08-29 (×2): qty 1

## 2015-08-29 MED ORDER — SOD CITRATE-CITRIC ACID 500-334 MG/5ML PO SOLN: 30.0000 mL | ORAL | Status: DC | PRN

## 2015-08-29 MED ORDER — OXYCODONE-ACETAMINOPHEN 5-325 MG PO TABS: 1.0000 | ORAL_TABLET | ORAL | Status: DC | PRN

## 2015-08-29 MED ORDER — LACTATED RINGERS IV SOLN
INTRAVENOUS | Status: DC
  Administered 2015-08-29: 17:00:00 via INTRAVENOUS

## 2015-08-29 MED ORDER — OXYTOCIN 40 UNITS IN LACTATED RINGERS INFUSION - SIMPLE MED: 2.5000 [IU]/h | INTRAVENOUS | Status: DC

## 2015-08-29 MED ORDER — PENICILLIN G POTASSIUM 5000000 UNITS IJ SOLR: 2.5000 10*6.[IU] | Freq: Once | INTRAVENOUS | Status: DC

## 2015-08-29 MED ORDER — OXYCODONE-ACETAMINOPHEN 5-325 MG PO TABS: 2.0000 | ORAL_TABLET | ORAL | Status: DC | PRN

## 2015-08-29 MED ORDER — LACTATED RINGERS IV SOLN: 500.0000 mL | INTRAVENOUS | Status: DC | PRN

## 2015-08-29 MED ORDER — ONDANSETRON HCL 4 MG/2ML IJ SOLN: 4.0000 mg | Freq: Four times a day (QID) | INTRAMUSCULAR | Status: DC | PRN

## 2015-08-29 NOTE — H&P (Signed)
LABOR AND DELIVERY ADMISSION HISTORY AND PHYSICAL NOTE  Kristi West is a 25 y.o. female G2P1001 with IUP at 5165w6d by 9wk US presenting for active labor.   She reports positive fetal movement. She denies leakage of fluid or vaginal bleeding.  Prenatal History/Complications:  Past Medical History: Past Medical History:  Diagnosis Date  . Abnormal Pap smear   . Albinism (HCC)   . Anemia   . Anxiety   . Atrial fibrillation, unspecified   . Heart murmur   . Pregnant 07/07/2012  . Seizures (HCC)   . Vaginal Pap smear, abnormal     Past Surgical History: Past Surgical History:  Procedure Laterality Date  . TONSILLECTOMY    . WISDOM TOOTH EXTRACTION      Obstetrical History: OB History    Gravida Para Term Preterm AB Living   2 1 1     1    SAB TAB Ectopic Multiple Live Births           1      Social History: Social History   Social History  . Marital status: Single    Spouse name: N/A  . Number of children: N/A  . Years of education: N/A   Social History Main Topics  . Smoking status: Former Smoker    Years: 0.50    Types: Cigarettes  . Smokeless tobacco: Never Used  . Alcohol use No     Comment: occa  . Drug use: No  . Sexual activity: Yes    Birth control/ protection: None   Other Topics Concern  . None   Social History Narrative  . None    Family History: Family History  Problem Relation Age of Onset  . Cancer Maternal Grandmother     leukemia  . Diabetes Paternal Grandmother   . Stroke Paternal Grandfather   . SIDS Brother   . Cancer Maternal Aunt     stomach    Allergies: Allergies  Allergen Reactions  . Ceclor [Cefaclor] Hives  . Other Itching and Swelling    Fresh fruits cause the patient's mouth and throat to itch and swell.  . Peanut-Containing Drug Products Itching and Swelling    All types of nuts cause the patient's mouth and throat to swell and itch.    Prescriptions Prior to Admission  Medication Sig Dispense Refill Last  Dose  . acetaminophen (TYLENOL) 500 MG tablet Take 1,000 mg by mouth every 8 (eight) hours as needed for moderate pain. Reported on 08/11/2015   08/28/2015 at Unknown time  . aspirin 81 MG chewable tablet Chew 81 mg by mouth daily.   08/28/2015 at Unknown time  . calcium carbonate (TUMS - DOSED IN MG ELEMENTAL CALCIUM) 500 MG chewable tablet Chew 1 tablet by mouth daily.   08/28/2015 at Unknown time  . flintstones complete (FLINTSTONES) 60 MG chewable tablet Chew 2 tablets by mouth daily.   08/28/2015 at Unknown time  . butalbital-acetaminophen-caffeine (FIORICET) 50-325-40 MG tablet Take 1-2 tablets by mouth every 6 (six) hours as needed for headache. (Patient not taking: Reported on 08/11/2015) 10 tablet 0 Not Taking     Review of Systems   All systems reviewed and negative except as stated in HPI  Blood pressure 128/70, pulse 97, temperature 98.4 F (36.9 C), temperature source Oral, resp. rate 18, last menstrual period 12/14/2014. General appearance: alert, cooperative and no distress Lungs: clear to auscultation bilaterally Heart: regular rate and rhythm Abdomen: soft, non-tender; bowel sounds normal Extremities: No calf swelling  or tenderness Presentation: cephalic Fetal monitoring: cat 1 Uterine activity: UCs irregular Dilation: 3 Effacement (%): 50 Station: -2 Exam by:: Dr. Chanetta Marshall   Prenatal labs: ABO, Rh: O/Positive/-- (02/01 1626) Antibody: Negative (06/02 0903) Rubella: Immune RPR: Non Reactive (06/02 0903)  HBsAg: Negative (02/01 1626)  HIV: Non Reactive (06/02 0903)  GBS:    1 hr Glucola: 2 hr passed Genetic screening:  neg Anatomy US: normal female   Prenatal Transfer Tool  Maternal Diabetes: No Genetic Screening: Normal Maternal Ultrasounds/Referrals: Normal Fetal Ultrasounds or other Referrals:  None Maternal Substance Abuse:  No Significant Maternal Medications:  Meds include: Other: ASA for Afib Significant Maternal Lab Results: Lab values include: Other:  GBS unk  Results for orders placed or performed during the hospital encounter of 08/29/15 (from the past 24 hour(s))  Amnisure rupture of membrane (rom)not at [facility]   Collection Time: 08/29/15  2:37 PM  Result Value Ref Range   Amnisure ROM NEGATIVE     Patient Active Problem List   Diagnosis Date Noted  . Fetal echogenic intracardiac focus on prenatal ultrasound 06/28/2015  . Low grade squamous intraepithelial lesion (LGSIL) on Papanicolaou smear of cervix 04/12/2015  . Supervision of normal pregnancy 02/23/2015  . Vitamin D deficiency 12/07/2014  . GAD (generalized anxiety disorder) 12/03/2014  . Nystagmus 07/28/2012    Assessment: Kristi West is a 25 y.o. G2P1001 at [redacted]w[redacted]d here for active labor.  #Labor: expectant management #Pain: undecided #FWB: Cat 1 #ID:  GBS unk > PCN. Discussed cephalosporin allergy, pt has tolerated PCN in the past. #MOF: both #MOC: POPs  Kristi West 08/29/2015, 4:10 PM   OB FELLOW HISTORY AND PHYSICAL ATTESTATION  I have seen and examined this patient; I agree with above documentation in the resident's note.    Kristi West 08/29/2015, 8:28 PM

## 2015-08-29 NOTE — MAU Note (Signed)
Urine in lab 

## 2015-08-29 NOTE — MAU Note (Signed)
Just got out of [facility]-  (A-fib), was transferred to [facility]- Sat 8/[facility] Marland Kitchen.  Noted liquid in her underwear today, it was blood.  No prior bleeding. ? Leaking earlier today, irreg ctx's

## 2015-08-30 ENCOUNTER — Telehealth: Payer: Self-pay | Admitting: *Deleted

## 2015-08-30 ENCOUNTER — Inpatient Hospital Stay (HOSPITAL_COMMUNITY)
Admission: AD | Admit: 2015-08-30 | Discharge: 2015-09-02 | DRG: 774 | Disposition: A | Discharge status: Home / Self Care | Payer: BLUE CROSS/BLUE SHIELD | Source: Ambulatory Visit | Attending: Obstetrics and Gynecology | Admitting: Obstetrics and Gynecology

## 2015-08-30 ENCOUNTER — Encounter (HOSPITAL_COMMUNITY): Payer: Self-pay

## 2015-08-30 DIAGNOSIS — O99214 Obesity complicating childbirth: Secondary | ICD-10-CM | POA: Diagnosis not present

## 2015-08-30 DIAGNOSIS — Z823 Family history of stroke: Secondary | ICD-10-CM

## 2015-08-30 DIAGNOSIS — Z3A37 37 weeks gestation of pregnancy: Secondary | ICD-10-CM | POA: Diagnosis not present

## 2015-08-30 DIAGNOSIS — Z806 Family history of leukemia: Secondary | ICD-10-CM

## 2015-08-30 DIAGNOSIS — O4693 Antepartum hemorrhage, unspecified, third trimester: Secondary | ICD-10-CM

## 2015-08-30 DIAGNOSIS — Z87891 Personal history of nicotine dependence: Secondary | ICD-10-CM | POA: Diagnosis not present

## 2015-08-30 DIAGNOSIS — O9942 Diseases of the circulatory system complicating childbirth: Secondary | ICD-10-CM | POA: Diagnosis present

## 2015-08-30 DIAGNOSIS — F411 Generalized anxiety disorder: Secondary | ICD-10-CM | POA: Diagnosis not present

## 2015-08-30 DIAGNOSIS — E7030 Albinism, unspecified: Secondary | ICD-10-CM | POA: Diagnosis not present

## 2015-08-30 DIAGNOSIS — I4891 Unspecified atrial fibrillation: Secondary | ICD-10-CM | POA: Diagnosis present

## 2015-08-30 DIAGNOSIS — O479 False labor, unspecified: Secondary | ICD-10-CM

## 2015-08-30 DIAGNOSIS — Z833 Family history of diabetes mellitus: Secondary | ICD-10-CM | POA: Diagnosis not present

## 2015-08-30 DIAGNOSIS — Z6841 Body Mass Index (BMI) 40.0 and over, adult: Secondary | ICD-10-CM

## 2015-08-30 DIAGNOSIS — O4690 Antepartum hemorrhage, unspecified, unspecified trimester: Secondary | ICD-10-CM | POA: Diagnosis present

## 2015-08-30 DIAGNOSIS — O99344 Other mental disorders complicating childbirth: Secondary | ICD-10-CM | POA: Diagnosis not present

## 2015-08-30 DIAGNOSIS — Q828 Other specified congenital malformations of skin: Secondary | ICD-10-CM | POA: Diagnosis not present

## 2015-08-30 DIAGNOSIS — Z23 Encounter for immunization: Secondary | ICD-10-CM | POA: Diagnosis not present

## 2015-08-30 LAB — RPR: RPR: NONREACTIVE

## 2015-08-30 MED ORDER — OXYCODONE-ACETAMINOPHEN 5-325 MG PO TABS: 2.0000 | ORAL_TABLET | ORAL | Status: DC | PRN

## 2015-08-30 MED ORDER — OXYTOCIN 40 UNITS IN LACTATED RINGERS INFUSION - SIMPLE MED
2.5000 [IU]/h | INTRAVENOUS | Status: DC
  Administered 2015-08-31: 2.5 [IU]/h via INTRAVENOUS

## 2015-08-30 MED ORDER — ONDANSETRON HCL 4 MG/2ML IJ SOLN: 4.0000 mg | Freq: Four times a day (QID) | INTRAMUSCULAR | Status: DC | PRN

## 2015-08-30 MED ORDER — SOD CITRATE-CITRIC ACID 500-334 MG/5ML PO SOLN: 30.0000 mL | ORAL | Status: DC | PRN

## 2015-08-30 MED ORDER — OXYCODONE-ACETAMINOPHEN 5-325 MG PO TABS: 1.0000 | ORAL_TABLET | ORAL | Status: DC | PRN

## 2015-08-30 MED ORDER — LACTATED RINGERS IV SOLN: 500.0000 mL | INTRAVENOUS | Status: DC | PRN

## 2015-08-30 MED ORDER — ACETAMINOPHEN 325 MG PO TABS: 650.0000 mg | ORAL_TABLET | ORAL | Status: DC | PRN

## 2015-08-30 MED ORDER — LIDOCAINE HCL (PF) 1 % IJ SOLN
30.0000 mL | INTRAMUSCULAR | Status: DC | PRN
  Filled 2015-08-30: qty 30

## 2015-08-30 MED ORDER — OXYTOCIN BOLUS FROM INFUSION
500.0000 mL | Freq: Once | INTRAVENOUS | Status: AC
  Administered 2015-08-31: 500 mL via INTRAVENOUS

## 2015-08-30 MED ORDER — LACTATED RINGERS IV SOLN
INTRAVENOUS | Status: DC
  Administered 2015-08-30: 22:00:00 via INTRAVENOUS
  Administered 2015-08-31: 125 mL/h via INTRAVENOUS

## 2015-08-30 MED ORDER — ZOLPIDEM TARTRATE 5 MG PO TABS
5.0000 mg | ORAL_TABLET | Freq: Every evening | ORAL | Status: DC | PRN
Start: 2015-08-30 — End: 2015-08-30

## 2015-08-30 NOTE — MAU Note (Signed)
Patient states that she was discharged earlier today. patstient states she started to bleed and at first it was had mucus but now it doesn't and she passed a clot.

## 2015-08-30 NOTE — MAU Note (Signed)
Notified provider that patient is here for vaginal bleeding and passed clots that started today. Patient is still 5/50/-3 on vaginal exam. Provider said she would admit patient.

## 2015-08-30 NOTE — Progress Notes (Signed)
Kristi West is a 25 y.o. G2P1001 at 3031w0d by 9wk US presenting in active labor.   Subjective: Kristi West is resting comfortably in bed during our visit. She reports that she can feel ctx ~every 15 min but they are not terribly uncomfortable for her. She and the nurse report that she has not been advancing in labor and she asks if she can return home where she would be more comfortable.  Objective: BP 136/76   Pulse 98   Temp 98.3 F (36.8 C) (Oral)   Resp 18   Ht 5\' 7"  (1.702 m)   Wt 280 lb (127 kg)   LMP 12/14/2014 (Exact Date)   BMI 43.85 kg/m  No intake/output data recorded. No intake/output data recorded.  FHT:  Fetal Heart Rate A  Mode External [applied and assessing] filed at 08/29/2015 1703  Baseline Rate (A) 155 bpm filed at 08/29/2015 2251  Variability 6-25 BPM filed at 08/29/2015 2251  Accelerations 15 x 15 filed at 08/29/2015 2251  Decelerations None filed at 08/29/2015 2251   UC:  irreg mild ctx SVE:   Dilation: 3.5 Effacement (%): 50 Station: -3 Exam by:: Reginia NaasL. Banks, RN   -cervical exam not remarkably changed for approx 10 hrs  Labs: Lab Results  Component Value Date   WBC 11.1 (H) 08/29/2015   HGB 11.1 (L) 08/29/2015   HCT 33.3 (L) 08/29/2015   MCV 84.1 08/29/2015   PLT 313 08/29/2015    Assessment / Plan: Pt initially presented in what appeared to be in active labor however she has not substantially progressed (by measurement of the same examiner) and pt asks about going home. Dr. Debroah LoopArnold confirms that the patient can go home as she is not in active labor. She will return when labor becomes more active.   Andres Egericia Brein, MD, PGY-1, MPH 08/30/2015, 12:14 AM   I have seen this patient and agree with the above resident's note.  See my complete discharge summary.  LEFTWICH-KIRBY, LISA Certified Nurse-Midwife

## 2015-08-30 NOTE — Discharge Instructions (Signed)

## 2015-08-30 NOTE — Progress Notes (Signed)
Kristi West is a 25 y.o. G2P1001 at 1354w0d admitted for early labor  Subjective: Pt comfortable with contractions irregular every 15-20 minutes. S/O in room for support.  Objective: BP 136/76   Pulse 98   Temp 98.3 F (36.8 C) (Oral)   Resp 18   Ht 5\' 7"  (1.702 m)   Wt 127 kg (280 lb)   LMP 12/14/2014 (Exact Date)   BMI 43.85 kg/m  No intake/output data recorded. No intake/output data recorded.  FHT:  FHR: 135 bpm, variability: moderate,  accelerations:  Present,  decelerations:  Absent UC:   irregular, every 15-20 minutes SVE:   Dilation: 5 Effacement (%): 50 Station: -3 Exam by:: Misty StanleyLisa L. CNM   Labs: Lab Results  Component Value Date   WBC 11.1 (H) 08/29/2015   HGB 11.1 (L) 08/29/2015   HCT 33.3 (L) 08/29/2015   MCV 84.1 08/29/2015   PLT 313 08/29/2015    Assessment / Plan: Protracted latent phase  Labor: Pt not in active labor at this time with no cervical change and irregular contractions.  With advanced dilation at 5 cm, offered pt option staying tonight for observation with recheck of cervix at 8 am or discharge home tonight with labor precautions/reasons to return. Pt desires discharge home and will return with increasing signs of labor. Preeclampsia:  n/a Fetal Wellbeing:  Category I Pain Control:  Labor support without medications I/D:  n/a Anticipated MOD:  No signs of active labor at this time  LEFTWICH-KIRBY, Roland Lipke 08/30/2015, 1:00 AM

## 2015-08-30 NOTE — Discharge Summary (Signed)
  Discharge Summary  G2P1001 @[redacted]w[redacted]d  [facility] for cervical change from 3 cm to 5 cm in MAU.  Pt reports contractions that are regular and painful, then become irregular and less often, then return to regular again off and on x1 week.  She reports good fetal movement, denies LOF, vaginal bleeding, vaginal itching/burning, urinary symptoms, h/a, dizziness, n/v, or fever/chills.    BP 110/62   Pulse 91   Temp 97.9 F (36.6 C) (Oral)   Resp 18   Ht 5\' 7"  (1.702 m)   Wt 127 kg (280 lb)   LMP 12/14/2014 (Exact Date)   BMI 43.85 kg/m    VS reviewed, nursing note reviewed,  Constitutional: well developed, well nourished, no distress HEENT: normocephalic CV: normal rate Pulm/chest wall: normal effort Abdomen: soft, gravid Neuro: alert and oriented x 3 Skin: warm, dry Psych: affect normal  FHR baseline 135, positive accels, negative decels, moderate variability, Category I FHR tracing Contractions every 4-15 minutes, irregular, mild to palpaton  Dilation: 5 Effacement (%): 50 Cervical Position: Middle Station: -3 Presentation: Vertex Exam by:: Minerva FesterLisa L. CNM    Assessment and Plan:  Pt cervix unchanged in [facility]  Pt reports contractions are infrequent and not as uncomfortable as before, when she arrived in MAU.  Discussed options with pt including staying overnight for observation or being discharged from the hospital tonight.  Pt desires discharge tonight and return if labor symptoms return.    D/C home with term labor precautions/third trimester precautions   Follow-up Information    [facility] .   Why:  As scheduled, return to MAU for emergencies/signs of labor Contact information: [address] [place] [phone]         LEFTWICH-KIRBY, Misty StanleyLISA, CNM 3:32 AM

## 2015-08-30 NOTE — Telephone Encounter (Signed)
Pt states [facility], dilated to 5 cm, now passing "blood clots, has not felt FM as much this am. Pt offered an appt today for an evaluation. [place].

## 2015-08-31 ENCOUNTER — Inpatient Hospital Stay (HOSPITAL_COMMUNITY): Payer: BLUE CROSS/BLUE SHIELD | Admitting: Anesthesiology

## 2015-08-31 ENCOUNTER — Encounter (HOSPITAL_COMMUNITY): Payer: Self-pay | Admitting: Anesthesiology

## 2015-08-31 DIAGNOSIS — Z3A37 37 weeks gestation of pregnancy: Secondary | ICD-10-CM | POA: Diagnosis not present

## 2015-08-31 DIAGNOSIS — F411 Generalized anxiety disorder: Secondary | ICD-10-CM | POA: Diagnosis present

## 2015-08-31 DIAGNOSIS — Q828 Other specified congenital malformations of skin: Secondary | ICD-10-CM | POA: Diagnosis not present

## 2015-08-31 DIAGNOSIS — I4891 Unspecified atrial fibrillation: Secondary | ICD-10-CM | POA: Diagnosis not present

## 2015-08-31 DIAGNOSIS — Z806 Family history of leukemia: Secondary | ICD-10-CM | POA: Diagnosis not present

## 2015-08-31 DIAGNOSIS — O99214 Obesity complicating childbirth: Secondary | ICD-10-CM | POA: Diagnosis not present

## 2015-08-31 DIAGNOSIS — O4693 Antepartum hemorrhage, unspecified, third trimester: Secondary | ICD-10-CM | POA: Diagnosis not present

## 2015-08-31 DIAGNOSIS — E7030 Albinism, unspecified: Secondary | ICD-10-CM | POA: Diagnosis present

## 2015-08-31 DIAGNOSIS — Z6841 Body Mass Index (BMI) 40.0 and over, adult: Secondary | ICD-10-CM | POA: Diagnosis not present

## 2015-08-31 DIAGNOSIS — Z823 Family history of stroke: Secondary | ICD-10-CM | POA: Diagnosis not present

## 2015-08-31 DIAGNOSIS — Z87891 Personal history of nicotine dependence: Secondary | ICD-10-CM

## 2015-08-31 DIAGNOSIS — O99344 Other mental disorders complicating childbirth: Secondary | ICD-10-CM

## 2015-08-31 DIAGNOSIS — Z833 Family history of diabetes mellitus: Secondary | ICD-10-CM | POA: Diagnosis not present

## 2015-08-31 DIAGNOSIS — O9942 Diseases of the circulatory system complicating childbirth: Secondary | ICD-10-CM | POA: Diagnosis not present

## 2015-08-31 LAB — CBC
HEMATOCRIT: 34.5 % — AB (ref 36.0–46.0)
Hemoglobin: 11.5 g/dL — ABNORMAL LOW (ref 12.0–15.0)
MCH: 28.5 pg (ref 26.0–34.0)
MCHC: 33.3 g/dL (ref 30.0–36.0)
MCV: 85.4 fL (ref 78.0–100.0)
PLATELETS: 283 10*3/uL (ref 150–400)
RBC: 4.04 MIL/uL (ref 3.87–5.11)
RDW: 14.5 % (ref 11.5–15.5)
WBC: 10.5 10*3/uL (ref 4.0–10.5)

## 2015-08-31 LAB — CULTURE, BETA STREP (GROUP B ONLY)

## 2015-08-31 LAB — TYPE AND SCREEN
ABO/RH(D): O POS
Antibody Screen: NEGATIVE

## 2015-08-31 MED ORDER — PHENYLEPHRINE 40 MCG/ML (10ML) SYRINGE FOR IV PUSH (FOR BLOOD PRESSURE SUPPORT)
80.0000 ug | PREFILLED_SYRINGE | INTRAVENOUS | Status: DC | PRN
  Filled 2015-08-31: qty 10
  Filled 2015-08-31: qty 5

## 2015-08-31 MED ORDER — OXYTOCIN 40 UNITS IN LACTATED RINGERS INFUSION - SIMPLE MED
1.0000 m[IU]/min | INTRAVENOUS | Status: DC
  Filled 2015-08-31: qty 1000

## 2015-08-31 MED ORDER — FENTANYL 2.5 MCG/ML BUPIVACAINE 1/10 % EPIDURAL INFUSION (WH - ANES)
14.0000 mL/h | INTRAMUSCULAR | Status: DC | PRN
  Administered 2015-08-31 (×2): 14 mL/h via EPIDURAL
  Filled 2015-08-31: qty 125

## 2015-08-31 MED ORDER — PRENATAL MULTIVITAMIN CH
1.0000 | ORAL_TABLET | Freq: Every day | ORAL | Status: DC
  Administered 2015-09-01 – 2015-09-02 (×2): 1 via ORAL
  Filled 2015-08-31 (×2): qty 1

## 2015-08-31 MED ORDER — DIBUCAINE 1 % RE OINT: 1.0000 "application " | TOPICAL_OINTMENT | RECTAL | Status: DC | PRN

## 2015-08-31 MED ORDER — ONDANSETRON HCL 4 MG/2ML IJ SOLN: 4.0000 mg | INTRAMUSCULAR | Status: DC | PRN

## 2015-08-31 MED ORDER — IBUPROFEN 600 MG PO TABS
600.0000 mg | ORAL_TABLET | Freq: Four times a day (QID) | ORAL | Status: DC
  Administered 2015-09-01 – 2015-09-02 (×7): 600 mg via ORAL
  Filled 2015-08-31 (×7): qty 1

## 2015-08-31 MED ORDER — DIPHENHYDRAMINE HCL 50 MG/ML IJ SOLN: 12.5000 mg | INTRAMUSCULAR | Status: DC | PRN

## 2015-08-31 MED ORDER — SIMETHICONE 80 MG PO CHEW: 80.0000 mg | CHEWABLE_TABLET | ORAL | Status: DC | PRN

## 2015-08-31 MED ORDER — ZOLPIDEM TARTRATE 5 MG PO TABS: 5.0000 mg | ORAL_TABLET | Freq: Every evening | ORAL | Status: DC | PRN

## 2015-08-31 MED ORDER — ONDANSETRON HCL 4 MG PO TABS: 4.0000 mg | ORAL_TABLET | ORAL | Status: DC | PRN

## 2015-08-31 MED ORDER — LIDOCAINE HCL (PF) 1 % IJ SOLN
INTRAMUSCULAR | Status: DC | PRN
  Administered 2015-08-31: 6 mL via EPIDURAL
  Administered 2015-08-31: 8 mL via EPIDURAL

## 2015-08-31 MED ORDER — DIPHENHYDRAMINE HCL 25 MG PO CAPS: 25.0000 mg | ORAL_CAPSULE | Freq: Four times a day (QID) | ORAL | Status: DC | PRN

## 2015-08-31 MED ORDER — PHENYLEPHRINE 40 MCG/ML (10ML) SYRINGE FOR IV PUSH (FOR BLOOD PRESSURE SUPPORT)
80.0000 ug | PREFILLED_SYRINGE | INTRAVENOUS | Status: DC | PRN
  Filled 2015-08-31: qty 5

## 2015-08-31 MED ORDER — LACTATED RINGERS IV SOLN
500.0000 mL | Freq: Once | INTRAVENOUS | Status: AC
  Administered 2015-08-31: 500 mL via INTRAVENOUS

## 2015-08-31 MED ORDER — TERBUTALINE SULFATE 1 MG/ML IJ SOLN
0.2500 mg | Freq: Once | INTRAMUSCULAR | Status: DC | PRN
  Filled 2015-08-31: qty 1

## 2015-08-31 MED ORDER — EPHEDRINE 5 MG/ML INJ
10.0000 mg | INTRAVENOUS | Status: DC | PRN
  Filled 2015-08-31: qty 4

## 2015-08-31 MED ORDER — WITCH HAZEL-GLYCERIN EX PADS
1.0000 "application " | MEDICATED_PAD | CUTANEOUS | Status: DC | PRN
  Administered 2015-08-31: 1 via TOPICAL

## 2015-08-31 MED ORDER — BENZOCAINE-MENTHOL 20-0.5 % EX AERO: 1.0000 "application " | INHALATION_SPRAY | CUTANEOUS | Status: DC | PRN

## 2015-08-31 MED ORDER — TETANUS-DIPHTH-ACELL PERTUSSIS 5-2.5-18.5 LF-MCG/0.5 IM SUSP
0.5000 mL | Freq: Once | INTRAMUSCULAR | Status: AC
Start: 2015-09-01 — End: 2015-09-01
  Administered 2015-09-01: 0.5 mL via INTRAMUSCULAR
  Filled 2015-08-31: qty 0.5

## 2015-08-31 MED ORDER — SENNOSIDES-DOCUSATE SODIUM 8.6-50 MG PO TABS
2.0000 | ORAL_TABLET | ORAL | Status: DC
  Administered 2015-09-01 (×2): 2 via ORAL
  Filled 2015-08-31 (×2): qty 2

## 2015-08-31 MED ORDER — ACETAMINOPHEN 325 MG PO TABS
650.0000 mg | ORAL_TABLET | ORAL | Status: DC | PRN
  Administered 2015-09-01 (×2): 650 mg via ORAL
  Filled 2015-08-31 (×2): qty 2

## 2015-08-31 MED ORDER — COCONUT OIL OIL: 1.0000 "application " | TOPICAL_OIL | Status: DC | PRN

## 2015-08-31 NOTE — Anesthesia Pain Management Evaluation Note (Signed)
  CRNA Pain Management Visit Note  Patient: Kristi West, 25 y.o., female  "Hello I am a member of the anesthesia team at [facility]. We have an anesthesia team available at all times to provide care throughout the hospital, including epidural management and anesthesia for C-section. I don't know your plan for the delivery whether it a natural birth, water birth, IV sedation, nitrous supplementation, doula or epidural, but we want to meet your pain goals."   1.Was your pain managed to your expectations on prior hospitalizations?   Yes   2.What is your expectation for pain management during this hospitalization?     Epidural  3.How can we help you reach that goal?  Explain pain relief options that are available  Record the patient's initial score and the patient's pain goal.   Pain: 3  Pain Goal: 7 [facility] wants you to be able to say your pain was always managed very well.  Kristi West 08/31/2015

## 2015-08-31 NOTE — Anesthesia Postprocedure Evaluation (Signed)
Anesthesia Post Note  Patient: Kristi West  Procedure(s) Performed: * No procedures listed *  Patient location during evaluation: Mother Baby Anesthesia Type: Epidural Level of consciousness: awake and alert Pain management: satisfactory to patient Vital Signs Assessment: post-procedure vital signs reviewed and stable Respiratory status: respiratory function stable Cardiovascular status: stable Postop Assessment: no headache, no backache, epidural receding, patient able to bend at knees, no signs of nausea or vomiting and adequate PO intake Anesthetic complications: no     Last Vitals:  Vitals:   08/31/15 1738 08/31/15 1845  BP: 121/69 130/70  Pulse: 71 98  Resp: 20 20  Temp: 36.9 C 37 C    Last Pain:  Vitals:   08/31/15 1845  TempSrc: Oral  PainSc: 0-No pain   Pain Goal: Patients Stated Pain Goal: 6 (08/31/15 0802)               Karleen DolphinFUSSELL,Vaniyah Lansky

## 2015-08-31 NOTE — Progress Notes (Signed)
RN hand holding cardio

## 2015-08-31 NOTE — Progress Notes (Signed)
RN at bedside hand holding cardio

## 2015-08-31 NOTE — Anesthesia Procedure Notes (Signed)
Epidural Patient location during procedure: OB Start time: 08/31/2015 10:34 AM End time: 08/31/2015 10:44 AM  Staffing Anesthesiologist: Leilani AbleHATCHETT, Shaneequa Bahner Performed: anesthesiologist   Preanesthetic Checklist Completed: patient identified, surgical consent, pre-op evaluation, timeout performed, IV checked, risks and benefits discussed and monitors and equipment checked  Epidural Patient position: sitting Prep: site prepped and draped and DuraPrep Patient monitoring: continuous pulse ox and blood pressure Approach: midline Location: L3-L4 Injection technique: LOR air  Needle:  Needle type: Tuohy  Needle gauge: 17 G Needle length: 9 cm and 9 Needle insertion depth: 8 cm Catheter type: closed end flexible Catheter size: 19 Gauge Catheter at skin depth: 13 cm Test dose: negative and Other  Assessment Sensory level: T10 Events: blood not aspirated, injection not painful, no injection resistance, negative IV test and no paresthesia  Additional Notes Reason for block:procedure for pain

## 2015-08-31 NOTE — Plan of Care (Signed)
Problem: Urinary Elimination: Goal: Ability to reestablish a normal urinary elimination pattern will improve Outcome: Completed/Met Date Met: 08/31/15 Voiding well without any difficulty.

## 2015-08-31 NOTE — H&P (Signed)
LABOR AND DELIVERY ADMISSION HISTORY AND PHYSICAL NOTE  Kristi West is a 25 y.o. female G2P1001 with IUP at 4026w6d by 9wk US presenting with bright red vaginal bleeding with large clot x 1 noted at home.  Pt has pictures of bleeding soaking 1/4 of pad at home and of a 3 cm x 3 cm approximately sized blood clot.  While in MAU, pt reports bleeding improved and reports only scant bleeding on pad.   She reports good fetal movement, denies LOF,  vaginal itching/burning, urinary symptoms, h/a, dizziness, n/v, or fever/chills.    Prenatal History/Complications:  Past Medical History:     Past Medical History:  Diagnosis Date  . Abnormal Pap smear   . Albinism (HCC)   . Anemia   . Anxiety   . Atrial fibrillation, unspecified   . Heart murmur   . Pregnant 07/07/2012  . Seizures (HCC)   . Vaginal Pap smear, abnormal     Past Surgical History:      Past Surgical History:  Procedure Laterality Date  . TONSILLECTOMY    . WISDOM TOOTH EXTRACTION      Obstetrical History:         OB History    Gravida Para Term Preterm AB Living   2 1 1     1    SAB TAB Ectopic Multiple Live Births           1      Social History: Social History        Social History  . Marital status: Single    Spouse name: N/A  . Number of children: N/A  . Years of education: N/A         Social History Main Topics  . Smoking status: Former Smoker    Years: 0.50    Types: Cigarettes  . Smokeless tobacco: Never Used  . Alcohol use No     Comment: occa  . Drug use: No  . Sexual activity: Yes    Birth control/ protection: None       Other Topics Concern  . None      Social History Narrative  . None    Family History:       Family History  Problem Relation Age of Onset  . Cancer Maternal Grandmother     leukemia  . Diabetes Paternal Grandmother   . Stroke Paternal Grandfather   . SIDS Brother   . Cancer Maternal Aunt     stomach     Allergies:      Allergies  Allergen Reactions  . Ceclor [Cefaclor] Hives  . Other Itching and Swelling    Fresh fruits cause the patient's mouth and throat to itch and swell.  . Peanut-Containing Drug Products Itching and Swelling    All types of nuts cause the patient's mouth and throat to swell and itch.           Prescriptions Prior to Admission  Medication Sig Dispense Refill Last Dose  . acetaminophen (TYLENOL) 500 MG tablet Take 1,000 mg by mouth every 8 (eight) hours as needed for moderate pain. Reported on 08/11/2015   08/28/2015 at Unknown time  . aspirin 81 MG chewable tablet Chew 81 mg by mouth daily.   08/28/2015 at Unknown time  . calcium carbonate (TUMS - DOSED IN MG ELEMENTAL CALCIUM) 500 MG chewable tablet Chew 1 tablet by mouth daily.   08/28/2015 at Unknown time  . flintstones complete (FLINTSTONES) 60 MG chewable tablet Chew 2  tablets by mouth daily.   08/28/2015 at Unknown time  . butalbital-acetaminophen-caffeine (FIORICET) 50-325-40 MG tablet Take 1-2 tablets by mouth every 6 (six) hours as needed for headache. (Patient not taking: Reported on 08/11/2015) 10 tablet 0 Not Taking     Review of Systems   All systems reviewed and negative except as stated in HPI BP (!) 143/76   Pulse 82   Temp 98.6 F (37 C) (Oral)   Resp 18   Ht  (1.702 m)   Wt 127 kg (280 lb)   LMP 12/14/2014 (Exact Date)   BMI 43.85 kg/m   Patient Vitals for the past 24 hrs:  BP Temp Temp src Pulse Resp Height Weight  08/30/15 2300 (!) 143/76 - - 82 18 - -  08/30/15 2230 - - - - -  (1.702 m) 127 kg (280 lb)  08/30/15 2216 133/80 98.6 F (37 C) Oral 80 18 - -  08/30/15 2109 131/76 98.5 F (36.9 C) Oral 102 18 - -   General appearance: alert, cooperative and no distress Lungs: clear to auscultation bilaterally Heart: regular rate and rhythm Abdomen: soft, non-tender; bowel sounds normal Extremities: No calf swelling or tenderness Presentation:  cephalic Fetal monitoring: cat 1 Uterine activity: UCs irregular  Dilation: 5 Effacement (%): 50 Cervical Position: Middle Station: -3 Presentation: Vertex Exam by:: Kayren Eaves RN    Prenatal labs: ABO, Rh: O/Positive/-- (02/01 1626) Antibody: Negative (06/02 0903) Rubella: Immune RPR: Non Reactive (06/02 0903)  HBsAg: Negative (02/01 1626)  HIV: Non Reactive (06/02 0903)  GBS:    1 hr Glucola: 2 hr passed Genetic screening:  neg Anatomy US: normal female   Prenatal Transfer Tool  Maternal Diabetes: No Genetic Screening: Normal Maternal Ultrasounds/Referrals: Normal Fetal Ultrasounds or other Referrals:  None Maternal Substance Abuse:  No Significant Maternal Medications:  Meds include: Other: ASA for Afib Significant Maternal Lab Results: Lab values include: Other: GBS unk       Results for orders placed or performed during the hospital encounter of 08/29/15 (from the past 24 hour(s))  Amnisure rupture of membrane (rom)not at [facility]   Collection Time: 08/29/15  2:37 PM  Result Value Ref Range   Amnisure ROM NEGATIVE         Patient Active Problem List   Diagnosis Date Noted  . Fetal echogenic intracardiac focus on prenatal ultrasound 06/28/2015  . Low grade squamous intraepithelial lesion (LGSIL) on Papanicolaou smear of cervix 04/12/2015  . Supervision of normal pregnancy 02/23/2015  . Vitamin D deficiency 12/07/2014  . GAD (generalized anxiety disorder) 12/03/2014  . Nystagmus 07/28/2012    Assessment: Kristi West is a 25 y.o. G2P1001 at [redacted]w[redacted]d here for vaginal bleeding at term  #Labor: expectant management/observation #Pain:            undecided #FWB:           Cat 1 #ID:                GBS unknown, collected 08/29/15 #MOF: both #MOC: POPs

## 2015-08-31 NOTE — Anesthesia Preprocedure Evaluation (Signed)
Anesthesia Evaluation  Patient identified by MRN, date of birth, ID band Patient awake    Reviewed: Allergy & Precautions, H&P , NPO status , Patient's Chart, lab work & pertinent test results  Airway Mallampati: II  TM Distance: >3 FB Neck ROM: full    Dental no notable dental hx.    Pulmonary neg pulmonary ROS, former smoker,    Pulmonary exam normal        Cardiovascular Normal cardiovascular exam     Neuro/Psych    GI/Hepatic negative GI ROS, Neg liver ROS,   Endo/Other  negative endocrine ROSMorbid obesity  Renal/GU negative Renal ROS     Musculoskeletal   Abdominal (+) + obese,   Peds  Hematology   Anesthesia Other Findings   Reproductive/Obstetrics (+) Pregnancy                             Anesthesia Physical Anesthesia Plan  ASA: III  Anesthesia Plan: Epidural   Post-op Pain Management:    Induction:   Airway Management Planned:   Additional Equipment:   Intra-op Plan:   Post-operative Plan:   Informed Consent: I have reviewed the patients History and Physical, chart, labs and discussed the procedure including the risks, benefits and alternatives for the proposed anesthesia with the patient or authorized representative who has indicated his/her understanding and acceptance.     Plan Discussed with:   Anesthesia Plan Comments:         Anesthesia Quick Evaluation

## 2015-08-31 NOTE — Progress Notes (Signed)
Pt with minimal bleeding at this time. Epidural in place. dialtion 6 cm 100%. AROM at 1245 FHTs: 140/mod variability   Ernestina PennaNicholas Schenk MD

## 2015-09-01 LAB — RPR: RPR Ser Ql: NONREACTIVE

## 2015-09-01 MED ORDER — OXYCODONE HCL 5 MG PO TABS
5.0000 mg | ORAL_TABLET | Freq: Four times a day (QID) | ORAL | Status: DC | PRN
  Administered 2015-09-01: 5 mg via ORAL
  Filled 2015-09-01: qty 1

## 2015-09-01 NOTE — Lactation Note (Signed)
This note was copied from a baby's chart. Lactation Consultation Note  Patient Name: Kristi Chinita GreenlandReakia Calcaterra ZOXWR'UToday's Date: 09/01/2015 Reason for consult: Follow-up assessment Baby 22 hours old. Mom reports that BF not going well. Assisted mom to latch baby in football position to right breast. Supported mom's large breast with a rolled cloth diaper. Baby latch deeply and suckled rhythmically with lips flanged and a few swallows noted. Mom able to hand express colostrum. Mom states that she feels much better about nursing. Baby was initially sleepy at breast, so undressed baby and mom able to latch baby herself without assistance. Enc mom to nurse STS and to offer breast often. Discussed assessment and interventions with patient's bedside nurse, Baxter HireKristen, RN.   Maternal Data    Feeding Feeding Type: Breast Fed Length of feed:  (LC assessed first 10 minutes of BF. )  LATCH Score/Interventions Latch: Grasps breast easily, tongue down, lips flanged, rhythmical sucking. Intervention(s): Adjust position;Assist with latch;Breast compression  Audible Swallowing: A few with stimulation Intervention(s): Skin to skin;Hand expression  Type of Nipple: Flat (short shaft) Intervention(s): No intervention needed  Comfort (Breast/Nipple): Soft / non-tender     Hold (Positioning): Assistance needed to correctly position infant at breast and maintain latch. Intervention(s): Breastfeeding basics reviewed;Support Pillows;Position options;Skin to skin  LATCH Score: 7  Lactation Tools Discussed/Used     Consult Status Consult Status: Follow-up Date: 09/02/15 Follow-up type: In-patient    Sherlyn HayJennifer D Seidy Labreck 09/01/2015, 2:12 PM

## 2015-09-01 NOTE — Progress Notes (Signed)
Post Partum Day 1 Subjective: no complaints, up ad lib, voiding, + flatus and reports minimal vaginal bleeding.  Objective: Blood pressure (!) 104/53, pulse 77, temperature 98.6 F (37 C), temperature source Oral, resp. rate 18, height 5\' 7"  (1.702 m), weight 127 kg (280 lb), last menstrual period 12/14/2014, SpO2 100 %, unknown if currently breastfeeding.  Physical Exam:  General: alert, cooperative and no distress Lochia: appropriate Uterine Fundus: firm Incision: N/A   Recent Labs  08/29/15 1712 08/30/15 2150  HGB 11.1* 11.5*  HCT 33.3* 34.5*    Assessment/Plan: Patient was seen by lactation this morning.    LOS: 1 day   Kristi MoralesSharon West 09/01/2015, 7:43 AM   OB FELLOW MEDICAL STUDENT NOTE ATTESTATION  I have seen and examined this patient. Note this is a Psychologist, occupationalmedical student note and as such does not necessarily reflect the patient's plan of care. Please see my note for this date of service.    Kristi West 09/01/2015, 1:59 PM

## 2015-09-01 NOTE — Progress Notes (Signed)
Post Partum Day 1 Subjective: no complaints, up ad lib, voiding, + flatus and reports minimal vaginal bleeding.  Objective: Blood pressure (!) 104/53, pulse 77, temperature 98.6 F (37 C), temperature source Oral, resp. rate 18, height 5\' 7"  (1.702 m), weight 127 kg (280 lb), last menstrual period 12/14/2014, SpO2 100 %, unknown if currently breastfeeding.  Physical Exam:  General: alert, cooperative and no distress Lochia: appropriate Uterine Fundus: firm Incision: N/A   Recent Labs  08/29/15 1712 08/30/15 2150  HGB 11.1* 11.5*  HCT 33.3* 34.5*    Assessment/Plan: Patient was seen by lactation this morning.  Plan for Discharge   LOS: 1 day   Ernestina PennaNicholas Schenk MD

## 2015-09-01 NOTE — Lactation Note (Signed)
This note was copied from a baby's chart. Lactation Consultation Note Mom stated her first child wouldn't latch so she couldn't BF. Mom states this baby isn't feeding well. Mom is breast/formula. Discussed supply and demand. Encouraged to BF first before giving formula. LPI information sheet given. Mom shown how to use DEBP & how to disassemble, clean, & reassemble parts. Mom knows to pump q3h for 15-20 min. Mom encouraged to feed baby 8-12 times/24 hours and with feeding cues. Referred to Baby and Me Book in Breastfeeding section Pg. 22-23 for position options and Proper latch demonstration. Educated about newborn behavior, STS, I&O, supply and demand. WH/LC brochure given w/resources, support groups and LC services. Since mom is breast and formula, Similac given. Mom has large pendulum breast w/flat nipples that roll well in finger tips. Encouraged to put cloth under breast for elevation and support.  Patient Name: Kristi West'UToday's Date: 09/01/2015 Reason for consult: Initial assessment   Maternal Data Has patient been taught Hand Expression?: Yes Does the patient have breastfeeding experience prior to this delivery?: No  Feeding Feeding Type: Formula Nipple Type: Slow - flow  LATCH Score/Interventions       Type of Nipple: Flat Intervention(s): Double electric pump;Hand pump  Comfort (Breast/Nipple): Soft / non-tender     Intervention(s): Breastfeeding basics reviewed;Support Pillows;Position options;Skin to skin     Lactation Tools Discussed/Used Tools: Pump Breast pump type: Double-Electric Breast Pump Pump Review: Setup, frequency, and cleaning;Milk Storage Initiated by:: Peri JeffersonL. Mcihael Hinderman RN IBCLC Date initiated:: 09/01/15   Consult Status Consult Status: Follow-up Date: 09/02/15 Follow-up type: In-patient    Charyl DancerCARVER, Clifton Kovacic G 09/01/2015, 6:22 AM

## 2015-09-02 MED ORDER — IBUPROFEN 600 MG PO TABS: 600.0000 mg | ORAL_TABLET | Freq: Four times a day (QID) | ORAL | 0 refills | Status: DC | PRN

## 2015-09-02 NOTE — Discharge Instructions (Signed)

## 2015-09-02 NOTE — Lactation Note (Signed)
This note was copied from a baby's chart. Lactation Consultation Note  Offered to assist mother w/ latching if needed she can call. Discussed making sure baby is deep on breast and massage/compress during STS feedings to keep baby active. Discussed pumping options. Mother's nipples are tender.  Encouraged applying ebm to coconut oil to nipples for soreness. Mom encouraged to feed baby 8-12 times/24 hours and with feeding cues.  Reviewed engorgement care and monitoring voids/stools. Reviewed pp 20-24 in Baby and me booklet.  Patient Name: Girl Chinita GreenlandReakia Bowns ZOXWR'UToday's Date: 09/02/2015 Reason for consult: Follow-up assessment   Maternal Data    Feeding Feeding Type: Formula Length of feed: 15 min  LATCH Score/Interventions                      Lactation Tools Discussed/Used     Consult Status Consult Status: Complete    Hardie PulleyBerkelhammer, Gearlene Godsil Boschen 09/02/2015, 9:57 AM

## 2015-09-02 NOTE — Discharge Summary (Signed)
OB Discharge Summary     Patient Name: Kristi West DOB: [birth year] MRN: [id number]  Date of admission: 08/30/2015 Delivering MD: Garth Bigness   Date of discharge: 09/02/2015  Admitting diagnosis: 37w bleeding, blood clots Intrauterine pregnancy: [redacted]w[redacted]d     Secondary diagnosis:  Active Problems:   Vaginal bleeding in pregnancy  Additional problems: latent phase labor     Discharge diagnosis: Term Pregnancy Delivered                                                                                                Post partum procedures:none  Augmentation: AROM  Complications: None  Hospital course:  Onset of Labor With Vaginal Delivery     25 y.o. yo [id number] at [redacted]w[redacted]d was admitted in Latent Labor on 08/30/2015. She was having reg ctx and vag bldg, and ended up progressing to SVD after AROM.  Patient had an uncomplicated labor course as follows:  Membrane Rupture Time/Date: 12:55 PM ,08/31/2015   Intrapartum Procedures: Episiotomy: None [1]                                         Lacerations:  None [1]  Patient had a delivery of a Viable infant. 08/31/2015  Information for the patient's newborn:  Kristi West [[id number]]  Delivery Method: Vaginal, Spontaneous Delivery (Filed from Delivery Summary)    Pateint had an uncomplicated postpartum course.  She is ambulating, tolerating a regular diet, passing flatus, and urinating well. Patient is discharged home in stable condition on 09/02/15.    Physical exam Vitals:   08/31/15 2235 09/01/15 0610 09/01/15 1810 09/02/15 0611  BP: 107/70 (!) 104/53 137/81 132/74  Pulse: 99 77 90 86  Resp: Temp: 98.6 F (37 C) 98.6 F (37 C) 98.2 F (36.8 C) 98 F (36.7 C)  TempSrc: Oral Oral Oral Oral  SpO2:      Weight:      Height:       General: alert and cooperative Lochia: appropriate Uterine Fundus: firm Incision: N/A DVT Evaluation: No evidence of DVT seen on physical exam. Labs: Lab Results  Component Value  Date   WBC 10.5 08/30/2015   HGB 11.5 (L) 08/30/2015   HCT 34.5 (L) 08/30/2015   MCV 85.4 08/30/2015   PLT 283 08/30/2015   CMP Latest Ref Rng & Units 12/03/2014  Glucose 65 - 99 mg/dL 66  BUN 6 - 20 mg/dL 15  Creatinine 9.52 - 8.41 mg/dL 3.24  Sodium 401 - 027 mmol/L 141  Potassium 3.5 - 5.2 mmol/L 4.4  Chloride 97 - 106 mmol/L 100  CO2 18 - 29 mmol/L 25  Calcium 8.7 - 10.2 mg/dL 9.1  Total Protein 6.0 - 8.5 g/dL 7.1  Total Bilirubin 0.0 - 1.2 mg/dL 0.3  Alkaline Phos 39 - 117 IU/L 105  AST 0 - 40 IU/L 19  ALT 0 - 32 IU/L 17    Discharge instruction: per After Visit Summary and "Baby and Me  Booklet".  After visit meds:    Medication List    STOP taking these medications   acetaminophen 500 MG tablet Commonly known as:  TYLENOL   aspirin 81 MG chewable tablet   butalbital-acetaminophen-caffeine 50-325-40 MG tablet Commonly known as:  FIORICET   calcium carbonate 500 MG chewable tablet Commonly known as:  TUMS - dosed in mg elemental calcium   flintstones complete 60 MG chewable tablet     TAKE these medications   ibuprofen 600 MG tablet Commonly known as:  ADVIL,MOTRIN Take 1 tablet (600 mg total) by mouth every 6 (six) hours as needed.       Diet: routine diet  Activity: Advance as tolerated. Pelvic rest for 6 weeks.   Outpatient follow up:6 weeks Follow up Appt:Future Appointments Date Time Provider Department Center  10/12/2015 2:30 PM Jacklyn ShellFrances Cresenzo-Dishmon, CNM [facility]   Follow up Visit:No Follow-up on file.  Postpartum contraception: Combination OCPs  Newborn Data: Live born female  Birth Weight: 6 lb 7.5 oz (2935 g) APGAR: 8, 9  Baby Feeding: Bottle and Breast Disposition:home with mother   09/02/2015 Kristi West, CNM  10:15 AM

## 2015-09-06 ENCOUNTER — Encounter: Payer: BLUE CROSS/BLUE SHIELD | Admitting: Advanced Practice Midwife

## 2015-10-12 ENCOUNTER — Ambulatory Visit (INDEPENDENT_AMBULATORY_CARE_PROVIDER_SITE_OTHER): Payer: BLUE CROSS/BLUE SHIELD | Admitting: Advanced Practice Midwife

## 2015-10-12 ENCOUNTER — Encounter: Payer: Self-pay | Admitting: Advanced Practice Midwife

## 2015-10-12 MED ORDER — NORETHIN-ETH ESTRAD-FE BIPHAS 1 MG-10 MCG / 10 MCG PO TABS: 1.0000 | ORAL_TABLET | Freq: Every day | ORAL | 3 refills | Status: DC

## 2015-10-12 NOTE — Progress Notes (Signed)
Kristi West is a 25 y.o. who presents for a postpartum visit. She is 6 weeks postpartum following a spontaneous vaginal delivery. I have fully reviewed the prenatal and intrapartum course. The delivery was at 37.1 gestational weeks.  Anesthesia: epidural. Postpartum course has been uneventful. Baby's course has been uneventful. Baby is feeding by bottle. Bleeding: Started perioid yesterday. Bowel function is normal. Bladder function is normal. Patient is sexually active. Contraception method is condoms. Postpartum depression screening: negative.   Current Outpatient Prescriptions:  .  ibuprofen (ADVIL,MOTRIN) 600 MG tablet, Take 1 tablet (600 mg total) by mouth every 6 (six) hours as needed. (Patient not taking: Reported on 10/12/2015), Disp: 30 tablet, Rfl: 0  Review of Systems   Constitutional: Negative for fever and chills Eyes: Negative for visual disturbances Respiratory: Negative for shortness of breath, dyspnea Cardiovascular: Negative for chest pain or palpitations  Gastrointestinal: Negative for vomiting, diarrhea and constipation Genitourinary: Negative for dysuria and urgency Musculoskeletal: Negative for back pain, joint pain, myalgias  Neurological: Negative for dizziness and headaches   Objective:     Vitals:   10/12/15 1440  BP: 132/82  Pulse: 96   General:  alert, cooperative and no distress   Breasts:  negative  Lungs: clear to auscultation bilaterally  Heart:  regular rate and rhythm  Abdomen: Soft, nontender   Vulva:  declined  Vagina: declined  Cervix:  declined  Corpus: Well involuted     Rectal Exam: declined        Assessment:    normal postpartum exam.  Plan:    1. Contraception: OCP (estrogen/progesterone)LoLoestrin. Start todya 2. Follow up in:   or as needed.

## 2015-11-02 ENCOUNTER — Telehealth: Payer: Self-pay | Admitting: Advanced Practice Midwife

## 2015-11-03 NOTE — Telephone Encounter (Signed)
Attempted to call patient for the second time in reference to her call yesterday concerning heavy bleeding on her birth control pills. LMTCB

## 2015-11-10 ENCOUNTER — Telehealth: Payer: Self-pay | Admitting: Women's Health

## 2015-11-10 NOTE — Telephone Encounter (Signed)
Pt requesting a return to work note for 11/14/2015. Pt SVD on 08/31/2015, postpartum visit 09/2015.  Return to work note left at front desk for pick up.

## 2016-07-02 ENCOUNTER — Ambulatory Visit (INDEPENDENT_AMBULATORY_CARE_PROVIDER_SITE_OTHER): Payer: BLUE CROSS/BLUE SHIELD | Admitting: Family Medicine

## 2016-07-02 ENCOUNTER — Encounter: Payer: Self-pay | Admitting: Family Medicine

## 2016-07-02 VITALS — BP 123/83 | HR 90 | Temp 97.9°F | Ht 67.0 in | Wt 281.0 lb

## 2016-07-02 DIAGNOSIS — L03012 Cellulitis of left finger: Secondary | ICD-10-CM | POA: Diagnosis not present

## 2016-07-02 MED ORDER — SULFAMETHOXAZOLE-TRIMETHOPRIM 800-160 MG PO TABS: 1.0000 | ORAL_TABLET | Freq: Two times a day (BID) | ORAL | 0 refills | Status: DC

## 2016-07-02 NOTE — Progress Notes (Signed)
BP (!) 134/93 (BP Location: Right Wrist)   Pulse 90   Temp 97.9 F (36.6 C) (Oral)   Ht 5\' 7"  (1.702 m)   Wt 281 lb (127.5 kg)   LMP 06/11/2016   BMI 44.01 kg/m    Subjective:    Patient ID: Kristi West, female    DOB: [birth year], 26 y.o.   MRN: [id number]  HPI: Kristi West is a 26 y.o. female presenting on 07/02/2016 for finger infection (left middle finger - x 3 days)   HPI Swelling and erythema of left middle finger Patient has been having swelling and redness and warmth of her left middle finger that and going on for the past 3 days. She said it started out with a hangnail but then it started getting this redness and warmth and swelling. She denies any fevers or chills or redness or warmth or pain going anywhere else. She is able to move and has sensation in that fingertip. the pain is rated as a 5 out of 10.  Relevant past medical, surgical, family and social history reviewed and updated as indicated. Interim medical history since our last visit reviewed. Allergies and medications reviewed and updated.  Review of Systems  Constitutional: Negative for chills and fever.  Respiratory: Negative for chest tightness and shortness of breath.   Cardiovascular: Negative for chest pain and leg swelling.  Musculoskeletal: Negative for back pain and gait problem.  Skin: Positive for color change. Negative for rash.  Neurological: Negative for light-headedness and headaches.  Psychiatric/Behavioral: Negative for agitation and behavioral problems.  All other systems reviewed and are negative.   Per HPI unless specifically indicated above     Objective:    BP (!) 134/93 (BP Location: Right Wrist)   Pulse 90   Temp 97.9 F (36.6 C) (Oral)   Ht 5\' 7"  (1.702 m)   Wt 281 lb (127.5 kg)   LMP 06/11/2016   BMI 44.01 kg/m   Wt Readings from Last 3 Encounters:  07/02/16 281 lb (127.5 kg)  10/12/15 269 lb (122 kg)  08/30/15 280 lb (127 kg)    Physical Exam  Constitutional:  She is oriented to person, place, and time. She appears well-developed and well-nourished. No distress.  Eyes: Conjunctivae are normal.  Cardiovascular: Normal rate, regular rhythm, normal heart sounds and intact distal pulses.   No murmur heard. Pulmonary/Chest: Effort normal and breath sounds normal. No respiratory distress. She has no wheezes. She has no rales.  Musculoskeletal: Normal range of motion. She exhibits no edema or tenderness.  Neurological: She is alert and oriented to person, place, and time. Coordination normal.  Skin: Skin is warm and dry. Lesion (Sensation intact and range of motion intact and capillary refill intact) noted. No rash noted. She is not diaphoretic. There is erythema (Swelling and erythema and warmth surrounding her medial paronychia of her left middle finger. No palpable fluctuation).  Psychiatric: She has a normal mood and affect. Her behavior is normal.  Nursing note and vitals reviewed.      Assessment & Plan:   Problem List Items Addressed This Visit    None    Visit Diagnoses    Cellulitis of left middle finger    -  Primary   Surrounding medial paronychia   Relevant Medications   sulfamethoxazole-trimethoprim (BACTRIM DS,SEPTRA DS) 800-160 MG tablet       Follow up plan: Return in about 4 weeks (around 07/30/2016), or if symptoms worsen or fail to improve, for  Well adult exam.  Counseling provided for all of the vaccine components No orders of the defined types were placed in this encounter.   Arville CareJoshua Creedence Heiss, MD [facility] 07/02/2016, 6:34 PM

## 2016-07-16 ENCOUNTER — Ambulatory Visit (INDEPENDENT_AMBULATORY_CARE_PROVIDER_SITE_OTHER): Payer: BLUE CROSS/BLUE SHIELD | Admitting: Family Medicine

## 2016-07-16 ENCOUNTER — Encounter: Payer: Self-pay | Admitting: Family Medicine

## 2016-07-16 VITALS — BP 114/75 | HR 68 | Temp 98.5°F | Ht 67.0 in | Wt 285.4 lb

## 2016-07-16 DIAGNOSIS — L03012 Cellulitis of left finger: Secondary | ICD-10-CM

## 2016-07-16 MED ORDER — DOXYCYCLINE HYCLATE 100 MG PO TABS: 100.0000 mg | ORAL_TABLET | Freq: Two times a day (BID) | ORAL | 0 refills | Status: DC

## 2016-07-16 NOTE — Progress Notes (Signed)
BP 114/75   Pulse 68   Temp 98.5 F (36.9 C) (Oral)   Ht 5\' 7"  (1.702 m)   Wt 285 lb 6.4 oz (129.5 kg)   BMI 44.70 kg/m    Subjective:    Patient ID: Kristi West, female    DOB: [birth year], 26 y.o.   MRN: [id number]  HPI: Kristi West is a 26 y.o. female presenting on 07/16/2016 for infected finger   HPI Follow-up infected finger Patient is coming in today for follow-up on infected finger. She has taken 10 days of Bactrim and the swelling did seem to go down but that has come back up especially on the lateral aspect of her right middle finger. She says on the medial aspect the swelling has gone down significantly. She feels like it is more swollen and is starting to come to a head on that side. She denies any numbness or weakness or loss of range of motion or fevers or chills. She is having a lot of pain on that side where the swelling is.  Relevant past medical, surgical, family and social history reviewed and updated as indicated. Interim medical history since our last visit reviewed. Allergies and medications reviewed and updated.  Review of Systems  Constitutional: Negative for chills and fever.  Respiratory: Negative for chest tightness and shortness of breath.   Cardiovascular: Negative for chest pain and leg swelling.  Musculoskeletal: Negative for back pain and gait problem.  Skin: Positive for color change. Negative for rash.  Psychiatric/Behavioral: Negative for agitation and behavioral problems.  All other systems reviewed and are negative.   Per HPI unless specifically indicated above        Objective:    BP 114/75   Pulse 68   Temp 98.5 F (36.9 C) (Oral)   Ht 5\' 7"  (1.702 m)   Wt 285 lb 6.4 oz (129.5 kg)   BMI 44.70 kg/m   Wt Readings from Last 3 Encounters:  07/16/16 285 lb 6.4 oz (129.5 kg)  07/02/16 281 lb (127.5 kg)  10/12/15 269 lb (122 kg)    Physical Exam  Constitutional: She is oriented to person, place, and time. She appears  well-developed and well-nourished. No distress.  Eyes: Conjunctivae are normal.  Cardiovascular: Normal rate, regular rhythm, normal heart sounds and intact distal pulses.   No murmur heard. Pulmonary/Chest: Effort normal and breath sounds normal. No respiratory distress. She has no wheezes. She has no rales.  Musculoskeletal: Normal range of motion.  Neurological: She is alert and oriented to person, place, and time. Coordination normal.  Skin: Skin is warm and dry. No rash noted. She is not diaphoretic. There is erythema (Swelling and erythema and cellulitis along the lateral paronychia of the right middle finger with what appears to be coming to a head. Erythema on medial paronychia does seem to be improved but on lateral paronychia and seemed to be worsened. She stopped t).  Psychiatric: She has a normal mood and affect. Her behavior is normal.  Nursing note and vitals reviewed.  she stopped the antibiotic a few days ago and had not improved.  I&D: Region was anesthetized with topical anesthesia. Incision was made on anterior medial aspect of the open wound. Significant serosanguineous and purulent drainage was exuded. Culture was taken. 4 x 4 and tape was placed over the top. Bleeding was minimal and patient tolerated procedure well.     Assessment & Plan:   Problem List Items Addressed This Visit  None    Visit Diagnoses    Cellulitis of left middle finger    -  Primary   Relevant Medications   doxycycline (VIBRA-TABS) 100 MG tablet   Other Relevant Orders   Anaerobic and Aerobic Culture       Follow up plan: Return if symptoms worsen or fail to improve.  Counseling provided for all of the vaccine components No orders of the defined types were placed in this encounter.   Arville CareJoshua Kassondra Geil, MD [facility] 07/16/2016, 6:16 PM

## 2016-07-17 ENCOUNTER — Telehealth: Payer: Self-pay

## 2016-07-17 DIAGNOSIS — L03012 Cellulitis of left finger: Secondary | ICD-10-CM

## 2016-07-17 MED ORDER — DOXYCYCLINE HYCLATE 100 MG PO TABS: 100.0000 mg | ORAL_TABLET | Freq: Two times a day (BID) | ORAL | 0 refills | Status: DC

## 2016-07-17 NOTE — Telephone Encounter (Signed)
[place] that was sent in yesterday [place] [facility] [place]  I corrected this and send in a new prescription.

## 2016-07-21 LAB — ANAEROBIC AND AEROBIC CULTURE

## 2017-02-12 ENCOUNTER — Ambulatory Visit: Payer: BLUE CROSS/BLUE SHIELD | Admitting: Adult Health

## 2017-04-02 ENCOUNTER — Ambulatory Visit: Payer: BLUE CROSS/BLUE SHIELD | Admitting: Family

## 2017-04-02 DIAGNOSIS — G44219 Episodic tension-type headache, not intractable: Secondary | ICD-10-CM | POA: Diagnosis not present

## 2017-04-02 DIAGNOSIS — R0781 Pleurodynia: Secondary | ICD-10-CM | POA: Diagnosis not present

## 2017-04-02 DIAGNOSIS — I4891 Unspecified atrial fibrillation: Secondary | ICD-10-CM | POA: Diagnosis not present

## 2017-04-02 DIAGNOSIS — H40033 Anatomical narrow angle, bilateral: Secondary | ICD-10-CM | POA: Diagnosis not present

## 2017-04-03 ENCOUNTER — Encounter: Payer: Self-pay | Admitting: Family

## 2017-04-05 DIAGNOSIS — N814 Uterovaginal prolapse, unspecified: Secondary | ICD-10-CM | POA: Diagnosis not present

## 2017-04-15 ENCOUNTER — Ambulatory Visit: Payer: BLUE CROSS/BLUE SHIELD | Admitting: Obstetrics & Gynecology

## 2017-04-15 ENCOUNTER — Encounter: Payer: Self-pay | Admitting: Obstetrics & Gynecology

## 2017-04-15 VITALS — BP 104/76 | HR 68 | Ht 67.0 in | Wt 269.0 lb

## 2017-04-15 DIAGNOSIS — Z3009 Encounter for other general counseling and advice on contraception: Secondary | ICD-10-CM | POA: Diagnosis not present

## 2017-04-15 DIAGNOSIS — N760 Acute vaginitis: Secondary | ICD-10-CM

## 2017-04-15 DIAGNOSIS — N814 Uterovaginal prolapse, unspecified: Secondary | ICD-10-CM

## 2017-04-15 DIAGNOSIS — N812 Incomplete uterovaginal prolapse: Secondary | ICD-10-CM | POA: Diagnosis not present

## 2017-04-15 DIAGNOSIS — B9689 Other specified bacterial agents as the cause of diseases classified elsewhere: Secondary | ICD-10-CM

## 2017-04-15 MED ORDER — METRONIDAZOLE 0.75 % VA GEL: VAGINAL | 0 refills | Status: DC

## 2017-04-15 NOTE — Progress Notes (Signed)
Chief Complaint  Patient presents with  . uterus has dropped      27 y.o. [id number] No LMP recorded. The current method of family planning is none.  Outpatient Encounter Medications as of 04/15/2017  Medication Sig  . diltiazem (CARDIZEM CD) 120 MG 24 hr capsule Take 120 mg by mouth daily.   . metroNIDAZOLE (METROGEL VAGINAL) 0.75 % vaginal gel Nightly x 5 nights (Patient not taking: Reported on 06/24/2017)  . [DISCONTINUED] doxycycline (VIBRA-TABS) 100 MG tablet Take 1 tablet (100 mg total) by mouth 2 (two) times daily. 1 po bid   No facility-administered encounter medications on file as of 04/15/2017.     Subjective Kristi West presents complaining of sensation of a fullness in her vagina as if her uterus her bladder is falling out It is been going on now for a few months but getting progressively worse Is worse with lifting pushing pulling Nothing really seems to make it better except avoiding these activities The discomfort is more of a pressure not really pain so it is mild to moderate There is no bleeding associated No pain with intercourse Past Medical History:  Diagnosis Date  . A-fib (HCC)   . Abnormal Pap smear   . Albinism (HCC)   . Anemia   . Anxiety   . Atrial fibrillation, unspecified   . Heart murmur   . Pregnant 07/07/2012  . Seizures (HCC)   . Vaginal Pap smear, abnormal     Past Surgical History:  Procedure Laterality Date  . TONSILLECTOMY    . WISDOM TOOTH EXTRACTION      OB History    Gravida  3   Para  2   Term  2   Preterm      AB  1   Living  2     SAB      TAB      Ectopic      Multiple  0   Live Births  2           Allergies  Allergen Reactions  . Ceclor [Cefaclor] Hives  . Other Itching and Swelling    Fresh fruits cause the patient's mouth and throat to itch and swell.  . Peanut-Containing Drug Products Itching and Swelling    All types of nuts cause the patient's mouth and throat to swell and itch.     Social History   Socioeconomic History  . Marital status: Single    Spouse name: Not on file  . Number of children: Not on file  . Years of education: Not on file  . Highest education level: Not on file  Occupational History  . Not on file  Social Needs  . Financial resource strain: Not on file  . Food insecurity:    Worry: Not on file    Inability: Not on file  . Transportation needs:    Medical: Not on file    Non-medical: Not on file  Tobacco Use  . Smoking status: Former Smoker    Years: 0.50    Types: Cigarettes  . Smokeless tobacco: Never Used  Substance and Sexual Activity  . Alcohol use: No    Comment: occa  . Drug use: No  . Sexual activity: Yes    Birth control/protection: Condom  Lifestyle  . Physical activity:    Days per week: Not on file    Minutes per session: Not on file  . Stress: Not on file  Relationships  .  Social connections:    Talks on phone: Not on file    Gets together: Not on file    Attends religious service: Not on file    Active member of club or organization: Not on file    Attends meetings of clubs or organizations: Not on file    Relationship status: Not on file  Other Topics Concern  . Not on file  Social History Narrative  . Not on file    Family History  Problem Relation Age of Onset  . Cancer Maternal Grandmother        leukemia  . Diabetes Paternal Grandmother   . Stroke Paternal Grandfather   . SIDS Brother   . Cancer Maternal Aunt        stomach    Medications:       Current Outpatient Medications:  .  diltiazem (CARDIZEM CD) 120 MG 24 hr capsule, Take 120 mg by mouth daily. , Disp: , Rfl:  .  metroNIDAZOLE (METROGEL VAGINAL) 0.75 % vaginal gel, Nightly x 5 nights (Patient not taking: Reported on 06/24/2017), Disp: 70 g, Rfl: 0  Objective Blood pressure 104/76, pulse 68, height 5\' 7"  (1.702 m), weight 269 lb (122 kg), not currently breastfeeding.  General WDWN female NAD Vulva:  normal appearing vulva with  no masses, tenderness or lesions Vagina: Grade 2 uterine prolapse grade 1 cystocele no rectocele Cervix:  no cervical motion tenderness and no lesions Uterus:  normal size, contour, position, consistency, mobility, non-tender Adnexa: ovaries:present,  normal adnexa in size, nontender and no masses   Pertinent ROS No burning with urination, frequency or urgency No nausea, vomiting or diarrhea Nor fever chills or other constitutional symptoms   Labs or studies No new labs or scans    Impression Diagnoses this Encounter::   ICD-10-CM   1. BV (bacterial vaginosis) N76.0    B96.89   2. Uterine prolapse N81.4   3. Counseling for birth control regarding intrauterine device (IUD) Z30.09     Established relevant diagnosis(es):   Plan/Recommendations: Meds ordered this encounter  Medications  . metroNIDAZOLE (METROGEL VAGINAL) 0.75 % vaginal gel    Sig: Nightly x 5 nights    Dispense:  70 g    Refill:  0    Labs or Scans Ordered: No orders of the defined types were placed in this encounter.   Management:: Desires IUD placement, will call on her menses to have scheduled with Kristi West   Follow up See above     All questions were answered.

## 2017-06-24 ENCOUNTER — Other Ambulatory Visit (HOSPITAL_COMMUNITY)
Admission: RE | Admit: 2017-06-24 | Discharge: 2017-06-24 | Disposition: A | Discharge status: Home / Self Care | Payer: BLUE CROSS/BLUE SHIELD | Source: Ambulatory Visit | Attending: Advanced Practice Midwife | Admitting: Advanced Practice Midwife

## 2017-06-24 ENCOUNTER — Other Ambulatory Visit: Payer: Self-pay

## 2017-06-24 ENCOUNTER — Encounter: Payer: Self-pay | Admitting: Women's Health

## 2017-06-24 ENCOUNTER — Ambulatory Visit (INDEPENDENT_AMBULATORY_CARE_PROVIDER_SITE_OTHER): Payer: BLUE CROSS/BLUE SHIELD | Admitting: Women's Health

## 2017-06-24 VITALS — BP 110/72 | HR 61 | Ht 67.0 in | Wt 257.0 lb

## 2017-06-24 DIAGNOSIS — Z3009 Encounter for other general counseling and advice on contraception: Secondary | ICD-10-CM | POA: Insufficient documentation

## 2017-06-24 DIAGNOSIS — Z01419 Encounter for gynecological examination (general) (routine) without abnormal findings: Secondary | ICD-10-CM | POA: Insufficient documentation

## 2017-06-24 DIAGNOSIS — Z113 Encounter for screening for infections with a predominantly sexual mode of transmission: Secondary | ICD-10-CM | POA: Diagnosis not present

## 2017-06-24 DIAGNOSIS — R87610 Atypical squamous cells of undetermined significance on cytologic smear of cervix (ASC-US): Secondary | ICD-10-CM | POA: Insufficient documentation

## 2017-06-24 NOTE — Progress Notes (Signed)
   WELL-WOMAN EXAMINATION Patient name: Kristi West MRN [id number]  Date of birth: Jan 15, 1991 Chief Complaint:   Gynecologic Exam  History of Present Illness:   Kristi West is a 27 y.o. 663P2012 African American female being seen today for a routine [facility] well-woman exam.  Current complaints: wants IUD, thought that was what she was doing today, however [facility], so have to do that first  [facility]      does not desire labs Patient's last menstrual period was 06/17/2017. The current method of family planning is abstinence, last sex 10d ago, period started 5/27 Last pap 12/03/2014. Results were: normal Last mammogram: never. Results were: n/a Last colonoscopy: never. Results were: n/a  Review of Systems:   Pertinent items are noted in HPI Denies any headaches, blurred vision, fatigue, shortness of breath, chest pain, abdominal pain, abnormal vaginal discharge/itching/odor/irritation, problems with periods, bowel movements, urination, or intercourse unless otherwise stated above. Pertinent History Reviewed:  Reviewed past medical,surgical, social and family history.  Reviewed problem list, medications and allergies. Physical Assessment:   Vitals:   06/24/17 1628  BP: 110/72  Pulse: 61  Weight: 257 lb (116.6 kg)  Height: 5\' 7"  (1.702 m)  Body mass index is 40.25 kg/m.        Physical Examination:   General appearance - well appearing, and in no distress  Mental status - alert, oriented to person, place, and time  Psych:  She has a normal mood and affect  Skin - warm and dry, normal color, no suspicious lesions noted  Chest - effort normal, all lung fields clear to auscultation bilaterally  Heart - normal rate and regular rhythm  Neck:  midline trachea, no thyromegaly or nodules  Breasts - breasts appear normal, no suspicious masses, no skin or nipple changes or  axillary nodes  Abdomen - soft, nontender, nondistended, no  masses or organomegaly  Pelvic - VULVA: normal appearing vulva with no masses, tenderness or lesions  VAGINA: normal appearing vagina with normal color and discharge, no lesions  CERVIX: normal appearing cervix without discharge or lesions, no CMT  Thin prep pap is done w/ reflex HR HPV cotesting  UTERUS: uterus is felt to be normal size, shape, consistency and nontender   ADNEXA: No adnexal masses or tenderness noted.  Extremities:  No swelling or varicosities noted  No results found for this or any previous visit (from the past 24 hour(s)).  Assessment & Plan:  1) FP [facility] Well-Woman Exam  2) STD screen  3) Wants IUD> abstinence until insertion  Labs/procedures today: pap w/ gc/ct, hiv, rpr  Mammogram @26yo  or sooner if problems Colonoscopy @45 -50yo or sooner if problems  Orders Placed This Encounter  Procedures  . HIV antibody  . RPR    Follow-up: Return for next week for IUD insertion.  Cheral MarkerKimberly R Booker CNM, [facility] 06/24/2017 5:04 PM

## 2017-06-24 NOTE — Addendum Note (Signed)
Addended by: Tish FredericksonLANCASTER, Cammy Sanjurjo A on: 06/24/2017 05:06 PM   Modules accepted: Orders

## 2017-06-24 NOTE — Patient Instructions (Signed)
NO SEX UNTIL AFTER YOU GET YOUR BIRTH CONTROL  

## 2017-06-25 ENCOUNTER — Other Ambulatory Visit: Payer: BLUE CROSS/BLUE SHIELD | Admitting: Advanced Practice Midwife

## 2017-06-28 ENCOUNTER — Encounter: Payer: Self-pay | Admitting: Women's Health

## 2017-06-28 LAB — CYTOLOGY - PAP
CHLAMYDIA, DNA PROBE: NEGATIVE
DIAGNOSIS: UNDETERMINED — AB
HPV: NOT DETECTED
Neisseria Gonorrhea: NEGATIVE

## 2017-07-01 DIAGNOSIS — Z3009 Encounter for other general counseling and advice on contraception: Secondary | ICD-10-CM | POA: Diagnosis not present

## 2017-07-01 DIAGNOSIS — Z01419 Encounter for gynecological examination (general) (routine) without abnormal findings: Secondary | ICD-10-CM | POA: Diagnosis not present

## 2017-07-01 DIAGNOSIS — Z113 Encounter for screening for infections with a predominantly sexual mode of transmission: Secondary | ICD-10-CM | POA: Diagnosis not present

## 2017-07-02 ENCOUNTER — Encounter: Payer: Self-pay | Admitting: Women's Health

## 2017-07-02 ENCOUNTER — Ambulatory Visit (INDEPENDENT_AMBULATORY_CARE_PROVIDER_SITE_OTHER): Payer: BLUE CROSS/BLUE SHIELD | Admitting: Women's Health

## 2017-07-02 VITALS — BP 104/66 | HR 75 | Ht 67.0 in | Wt 259.0 lb

## 2017-07-02 DIAGNOSIS — Z3202 Encounter for pregnancy test, result negative: Secondary | ICD-10-CM

## 2017-07-02 DIAGNOSIS — Z30430 Encounter for insertion of intrauterine contraceptive device: Secondary | ICD-10-CM | POA: Diagnosis not present

## 2017-07-02 LAB — POCT URINE PREGNANCY: Preg Test, Ur: NEGATIVE

## 2017-07-02 LAB — HIV ANTIBODY (ROUTINE TESTING W REFLEX): HIV Screen 4th Generation wRfx: NONREACTIVE

## 2017-07-02 LAB — RPR: RPR Ser Ql: NONREACTIVE

## 2017-07-02 MED ORDER — LEVONORGESTREL 19.5 MCG/DAY IU IUD
INTRAUTERINE_SYSTEM | Freq: Once | INTRAUTERINE | Status: AC
  Administered 2017-07-02: 11:00:00 via INTRAUTERINE

## 2017-07-02 NOTE — Progress Notes (Signed)
   IUD INSERTION Patient name: Noel GeroldReakia L Dragoo MRN [id number]  Date of birth: 17-Apr-1990 Subjective Findings:   Noel GeroldReakia L Gipe is a 27 y.o. 373P2012 African American female being seen today for insertion of a Liletta IUD.   Patient's last menstrual period was 06/17/2017. Last sexual intercourse was >3wks ago Last pap6/3/19. Results were:  ASCUS w/ -HRHPV  The risks and benefits of the method and placement have been thouroughly reviewed with the patient and all questions were answered.  Specifically the patient is aware of failure rate of 01/998, expulsion of the IUD and of possible perforation.  The patient is aware of irregular bleeding due to the method and understands the incidence of irregular bleeding diminishes with time.  Signed copy of informed consent in chart.  Pertinent History Reviewed:   Reviewed past medical,surgical, social, obstetrical and family history.  Reviewed problem list, medications and allergies. Objective Findings & Procedure:   Vitals:   07/02/17 1039  BP: 104/66  Pulse: 75  Weight: 259 lb (117.5 kg)  Height: 5\' 7"  (1.702 m)  Body mass index is 40.57 kg/m.  Results for orders placed or performed in visit on 07/02/17 (from the past 24 hour(s))  POCT urine pregnancy   Collection Time: 07/02/17 10:57 AM  Result Value Ref Range   Preg Test, Ur Negative Negative  Results for orders placed or performed in visit on 06/24/17 (from the past 24 hour(s))  HIV antibody   Collection Time: 07/01/17  4:40 PM  Result Value Ref Range   HIV Screen 4th Generation wRfx Non Reactive Non Reactive  RPR   Collection Time: 07/01/17  4:40 PM  Result Value Ref Range   RPR Ser Ql Non Reactive Non Reactive     Time out was performed.  A graves speculum was placed in the vagina.  The cervix was visualized, prepped using Betadine, and grasped with a single tooth tenaculum. The uterus was found to be neutral and it sounded to 9 cm.  Liletta IUD placed per manufacturer's  recommendations. The strings were trimmed to approximately 3 cm. The patient tolerated the procedure well.   Informal transvaginal sonogram was performed and the proper placement of the IUD was verified. Assessment & Plan:   1) Liletta IUD insertion The patient was given post procedure instructions, including signs and symptoms of infection and to check for the strings after each menses or each month, and refraining from intercourse or anything in the vagina for 3 days. She was given a Liletta care card with date IUD placed, and date IUD to be removed. She is scheduled for a f/u appointment in 4 weeks.  Orders Placed This Encounter  Procedures  . POCT urine pregnancy    Return in about 1 month (around 07/30/2017) for F/U.  Cheral MarkerKimberly R Morningstar Toft CNM, [facility] 07/02/2017 11:20 AM

## 2017-07-02 NOTE — Patient Instructions (Signed)
 Nothing in vagina for 3 days (no sex, douching, tampons, etc...)  Check your strings once a month to make sure you can feel them, if you are not able to please let us know  If you develop a fever of 100.4 or more in the next few weeks, or if you develop severe abdominal pain, please let us know  Use a backup method of birth control, such as condoms, for 2 weeks    Intrauterine Device Insertion, Care After This sheet gives you information about how to care for yourself after your procedure. Your health care provider may also give you more specific instructions. If you have problems or questions, contact your health care provider. What can I expect after the procedure? After the procedure, it is common to have:  Cramps and pain in the abdomen.  Light bleeding (spotting) or heavier bleeding that is like your menstrual period. This may last for up to a few days.  Lower back pain.  Dizziness.  Headaches.  Nausea.  Follow these instructions at home:  Before resuming sexual activity, check to make sure that you can feel the IUD string(s). You should be able to feel the end of the string(s) below the opening of your cervix. If your IUD string is in place, you may resume sexual activity. ? If you had a hormonal IUD inserted more than 7 days after your most recent period started, you will need to use a backup method of birth control for 7 days after IUD insertion. Ask your health care provider whether this applies to you.  Continue to check that the IUD is still in place by feeling for the string(s) after every menstrual period, or once a month.  Take over-the-counter and prescription medicines only as told by your health care provider.  Do not drive or use heavy machinery while taking prescription pain medicine.  Keep all follow-up visits as told by your health care provider. This is important. Contact a health care provider if:  You have bleeding that is heavier or lasts longer than  a normal menstrual cycle.  You have a fever.  You have cramps or abdominal pain that get worse or do not get better with medicine.  You develop abdominal pain that is new or is not in the same area of earlier cramping and pain.  You feel lightheaded or weak.  You have abnormal or bad-smelling discharge from your vagina.  You have pain during sexual activity.  You have any of the following problems with your IUD string(s): ? The string bothers or hurts you or your sexual partner. ? You cannot feel the string. ? The string has gotten longer.  You can feel the IUD in your vagina.  You think you may be pregnant, or you miss your menstrual period.  You think you may have an STI (sexually transmitted infection). Get help right away if:  You have flu-like symptoms.  You have a fever and chills.  You can feel that your IUD has slipped out of place. Summary  After the procedure, it is common to have cramps and pain in the abdomen. It is also common to have light bleeding (spotting) or heavier bleeding that is like your menstrual period.  Continue to check that the IUD is still in place by feeling for the string(s) after every menstrual period, or once a month.  Keep all follow-up visits as told by your health care provider. This is important.  Contact your health care provider if   you have problems with your IUD string(s), such as the string getting longer or bothering you or your sexual partner. This information is not intended to replace advice given to you by your health care provider. Make sure you discuss any questions you have with your health care provider. Document Released: 09/06/2010 Document Revised: 11/30/2015 Document Reviewed: 11/30/2015 Elsevier Interactive Patient Education  2017 Elsevier Inc.  Levonorgestrel intrauterine device (IUD) What is this medicine? LEVONORGESTREL IUD (LEE voe nor jes trel) is a contraceptive (birth control) device. The device is placed  inside the uterus by a healthcare professional. It is used to prevent pregnancy. This device can also be used to treat heavy bleeding that occurs during your period. This medicine may be used for other purposes; ask your health care provider or pharmacist if you have questions. COMMON BRAND NAME(S): Kyleena, LILETTA, Mirena, Skyla What should I tell my health care provider before I take this medicine? They need to know if you have any of these conditions: -abnormal Pap smear -cancer of the breast, uterus, or cervix -diabetes -endometritis -genital or pelvic infection now or in the past -have more than one sexual partner or your partner has more than one partner -heart disease -history of an ectopic or tubal pregnancy -immune system problems -IUD in place -liver disease or tumor -problems with blood clots or take blood-thinners -seizures -use intravenous drugs -uterus of unusual shape -vaginal bleeding that has not been explained -an unusual or allergic reaction to levonorgestrel, other hormones, silicone, or polyethylene, medicines, foods, dyes, or preservatives -pregnant or trying to get pregnant -breast-feeding How should I use this medicine? This device is placed inside the uterus by a health care professional. Talk to your pediatrician regarding the use of this medicine in children. Special care may be needed. Overdosage: If you think you have taken too much of this medicine contact a poison control center or emergency room at once. NOTE: This medicine is only for you. Do not share this medicine with others. What if I miss a dose? This does not apply. Depending on the brand of device you have inserted, the device will need to be replaced every 3 to 5 years if you wish to continue using this type of birth control. What may interact with this medicine? Do not take this medicine with any of the following medications: -amprenavir -bosentan -fosamprenavir This medicine may also  interact with the following medications: -aprepitant -armodafinil -barbiturate medicines for inducing sleep or treating seizures -bexarotene -boceprevir -griseofulvin -medicines to treat seizures like carbamazepine, ethotoin, felbamate, oxcarbazepine, phenytoin, topiramate -modafinil -pioglitazone -rifabutin -rifampin -rifapentine -some medicines to treat HIV infection like atazanavir, efavirenz, indinavir, lopinavir, nelfinavir, tipranavir, ritonavir -St. John's wort -warfarin This list may not describe all possible interactions. Give your health care provider a list of all the medicines, herbs, non-prescription drugs, or dietary supplements you use. Also tell them if you smoke, drink alcohol, or use illegal drugs. Some items may interact with your medicine. What should I watch for while using this medicine? Visit your doctor or health care professional for regular check ups. See your doctor if you or your partner has sexual contact with others, becomes HIV positive, or gets a sexual transmitted disease. This product does not protect you against HIV infection (AIDS) or other sexually transmitted diseases. You can check the placement of the IUD yourself by reaching up to the top of your vagina with clean fingers to feel the threads. Do not pull on the threads. It is a good habit   to check placement after each menstrual period. Call your doctor right away if you feel more of the IUD than just the threads or if you cannot feel the threads at all. The IUD may come out by itself. You may become pregnant if the device comes out. If you notice that the IUD has come out use a backup birth control method like condoms and call your health care provider. Using tampons will not change the position of the IUD and are okay to use during your period. This IUD can be safely scanned with magnetic resonance imaging (MRI) only under specific conditions. Before you have an MRI, tell your healthcare provider that  you have an IUD in place, and which type of IUD you have in place. What side effects may I notice from receiving this medicine? Side effects that you should report to your doctor or health care professional as soon as possible: -allergic reactions like skin rash, itching or hives, swelling of the face, lips, or tongue -fever, flu-like symptoms -genital sores -high blood pressure -no menstrual period for 6 weeks during use -pain, swelling, warmth in the leg -pelvic pain or tenderness -severe or sudden headache -signs of pregnancy -stomach cramping -sudden shortness of breath -trouble with balance, talking, or walking -unusual vaginal bleeding, discharge -yellowing of the eyes or skin Side effects that usually do not require medical attention (report to your doctor or health care professional if they continue or are bothersome): -acne -breast pain -change in sex drive or performance -changes in weight -cramping, dizziness, or faintness while the device is being inserted -headache -irregular menstrual bleeding within first 3 to 6 months of use -nausea This list may not describe all possible side effects. Call your doctor for medical advice about side effects. You may report side effects to FDA at 1-800-FDA-1088. Where should I keep my medicine? This does not apply. NOTE: This sheet is a summary. It may not cover all possible information. If you have questions about this medicine, talk to your doctor, pharmacist, or health care provider.  2018 Elsevier/Gold Standard (2015-10-21 14:14:56)   

## 2017-07-02 NOTE — Addendum Note (Signed)
Addended by: Federico FlakeNES, PEGGY A on: 07/02/2017 11:25 AM   Modules accepted: Orders

## 2017-07-30 ENCOUNTER — Other Ambulatory Visit: Payer: Self-pay

## 2017-07-30 ENCOUNTER — Encounter: Payer: Self-pay | Admitting: Women's Health

## 2017-07-30 ENCOUNTER — Ambulatory Visit (INDEPENDENT_AMBULATORY_CARE_PROVIDER_SITE_OTHER): Payer: BLUE CROSS/BLUE SHIELD | Admitting: Women's Health

## 2017-07-30 VITALS — BP 119/69 | HR 65 | Ht 67.0 in | Wt 254.0 lb

## 2017-07-30 DIAGNOSIS — Z30431 Encounter for routine checking of intrauterine contraceptive device: Secondary | ICD-10-CM

## 2017-07-30 NOTE — Progress Notes (Signed)
   GYN VISIT Patient name: Kristi West MRN [id number]  Date of birth: 06-30-1990 Chief Complaint:   IUD check  History of Present Illness:   Kristi West is a 27 y.o. 363P2012 African American female being seen today for IUD check. Liletta IUD placed 07/02/17. Just stopped bleeding. Boyfriend checked strings and could feel them. No pain w/ sex. Some small bumps on Lt breast, don't hurt/itch.     Patient's last menstrual period was 07/22/2017 (approximate). The current method of family planning is IUD. Last pap 06/24/17. Results were:  ASCUS w/ -HRHPV Review of Systems:   Pertinent items are noted in HPI Denies fever/chills, dizziness, headaches, visual disturbances, fatigue, shortness of breath, chest pain, abdominal pain, vomiting, abnormal vaginal discharge/itching/odor/irritation, problems with periods, bowel movements, urination, or intercourse unless otherwise stated above.  Pertinent History Reviewed:  Reviewed past medical,surgical, social, obstetrical and family history.  Reviewed problem list, medications and allergies. Physical Assessment:   Vitals:   07/30/17 1623  BP: 119/69  Pulse: 65  Weight: 254 lb (115.2 kg)  Height: 5\' 7"  (1.702 m)  Body mass index is 39.78 kg/m.       Physical Examination:   General appearance: alert, well appearing, and in no distress  Mental status: alert, oriented to person, place, and time  Skin: warm & dry   Cardiovascular: normal heart rate noted  Respiratory: normal respiratory effort, no distress  Breast: 2 small flesh colored round raised bumps, appear to be bug bites  Abdomen: soft, non-tender   Pelvic: VULVA: normal appearing vulva with no masses, tenderness or lesions, VAGINA: normal appearing vagina with normal color and discharge, no lesions, CERVIX: normal appearing cervix without discharge or lesions, IUD strings visible, tucked behind cx  Extremities: no edema   No results found for this or any previous visit (from the past 24  hour(s)).  Assessment & Plan:  1) IUD check> IUD in place  2) 2 small bumps Lt breast> appear to be bug bites, if don't go away, could be cyst  Meds: No orders of the defined types were placed in this encounter.   No orders of the defined types were placed in this encounter.   Return for after June 3 for , Pap & physical.  Cheral MarkerKimberly R Erion Hermans CNM, [facility]NP-BC 07/30/2017 4:39 PM

## 2017-11-18 ENCOUNTER — Ambulatory Visit (INDEPENDENT_AMBULATORY_CARE_PROVIDER_SITE_OTHER): Payer: BLUE CROSS/BLUE SHIELD | Admitting: Physician Assistant

## 2017-11-18 ENCOUNTER — Encounter: Payer: Self-pay | Admitting: Physician Assistant

## 2017-11-18 VITALS — BP 127/84 | HR 81 | Temp 99.0°F | Ht 67.0 in | Wt 252.0 lb

## 2017-11-18 DIAGNOSIS — L0291 Cutaneous abscess, unspecified: Secondary | ICD-10-CM | POA: Diagnosis not present

## 2017-11-18 MED ORDER — DOXYCYCLINE HYCLATE 100 MG PO TABS: 100.0000 mg | ORAL_TABLET | Freq: Two times a day (BID) | ORAL | 0 refills | Status: DC

## 2017-11-19 NOTE — Progress Notes (Signed)
BP 127/84   Pulse 81   Temp 99 F (37.2 C) (Oral)   Ht 5\' 7"  (1.702 m)   Wt 252 lb (114.3 kg)   BMI 39.47 kg/m    Subjective:    Patient ID: Kristi West, female    DOB: September 04, 1990, 27 y.o.   MRN: [id number]  HPI: CAMBELL RICKENBACH is a 27 y.o. female presenting on 11/18/2017 for Redness (Right first finger); Edema (Right first finger); and Pain (right first finger) She has had a couple of days of increasing redness and swelling around her finger.  She denies fever or drainage at this time.  She does not have fever or chills.  Had an abscess in the past that dis have to be drained.   Past Medical History:  Diagnosis Date  . A-fib (HCC)   . Abnormal Pap smear   . Albinism (HCC)   . Anemia   . Anxiety   . Atrial fibrillation, unspecified   . Heart murmur   . Pregnant 07/07/2012  . Seizures (HCC)   . Vaginal Pap smear, abnormal    Relevant past medical, surgical, family and social history reviewed and updated as indicated. Interim medical history since our last visit reviewed. Allergies and medications reviewed and updated. DATA REVIEWED: CHART IN EPIC  Family History reviewed for pertinent findings.  Review of Systems  Constitutional: Negative.   HENT: Negative.   Eyes: Negative.   Respiratory: Negative.   Gastrointestinal: Negative.   Genitourinary: Negative.   Skin: Positive for color change and wound.    Allergies as of 11/18/2017      Reactions   Ceclor [cefaclor] Hives   Other Itching, Swelling   Fresh fruits cause the patient's mouth and throat to itch and swell.   Peanut-containing Drug Products Itching, Swelling   All types of nuts cause the patient's mouth and throat to swell and itch.      Medication List        Accurate as of 11/18/17 11:59 PM. Always use your most recent med list.          doxycycline 100 MG tablet Commonly known as:  VIBRA-TABS Take 1 tablet (100 mg total) by mouth 2 (two) times daily. 1 po bid          Objective:      BP 127/84   Pulse 81   Temp 99 F (37.2 C) (Oral)   Ht 5\' 7"  (1.702 m)   Wt 252 lb (114.3 kg)   BMI 39.47 kg/m   Allergies  Allergen Reactions  . Ceclor [Cefaclor] Hives  . Other Itching and Swelling    Fresh fruits cause the patient's mouth and throat to itch and swell.  . Peanut-Containing Drug Products Itching and Swelling    All types of nuts cause the patient's mouth and throat to swell and itch.    Wt Readings from Last 3 Encounters:  11/18/17 252 lb (114.3 kg)  07/30/17 254 lb (115.2 kg)  07/02/17 259 lb (117.5 kg)    Physical Exam  Constitutional: She is oriented to person, place, and time. She appears well-developed and well-nourished.  HENT:  Head: Normocephalic and atraumatic.  Eyes: Pupils are equal, round, and reactive to light. Conjunctivae and EOM are normal.  Cardiovascular: Normal rate, regular rhythm, normal heart sounds and intact distal pulses.  Pulmonary/Chest: Effort normal and breath sounds normal.  Abdominal: Soft. Bowel sounds are normal.  Neurological: She is alert and oriented to person, place,  and time. She has normal reflexes.  Skin: Skin is warm and dry. No rash noted. There is erythema.  Redness on the area around her nail  Psychiatric: She has a normal mood and affect. Her behavior is normal. Judgment and thought content normal.    Results for orders placed or performed in visit on 07/02/17  POCT urine pregnancy  Result Value Ref Range   Preg Test, Ur Negative Negative      Assessment & Plan:   1. Abscess - doxycycline (VIBRA-TABS) 100 MG tablet; Take 1 tablet (100 mg total) by mouth 2 (two) times daily. 1 po bid  Dispense: 20 tablet; Refill: 0   Continue all other maintenance medications as listed above.  Follow up plan: No follow-ups on file.  Educational handout given for survey  Remus Loffler PA-C [facility] [address]  [place], [place] [address] [phone]   11/19/2017, 9:38 PM

## 2017-11-25 ENCOUNTER — Encounter: Payer: Self-pay | Admitting: Physician Assistant

## 2017-11-25 ENCOUNTER — Other Ambulatory Visit: Payer: Self-pay | Admitting: Physician Assistant

## 2017-11-25 MED ORDER — CLINDAMYCIN HCL 300 MG PO CAPS: 300.0000 mg | ORAL_CAPSULE | Freq: Three times a day (TID) | ORAL | 0 refills | Status: DC

## 2017-11-30 ENCOUNTER — Ambulatory Visit: Payer: BLUE CROSS/BLUE SHIELD | Admitting: Family Medicine

## 2017-11-30 VITALS — BP 117/74 | HR 59 | Temp 98.2°F | Ht 67.0 in | Wt 247.0 lb

## 2017-11-30 DIAGNOSIS — L02512 Cutaneous abscess of left hand: Secondary | ICD-10-CM | POA: Diagnosis not present

## 2017-11-30 DIAGNOSIS — L03012 Cellulitis of left finger: Secondary | ICD-10-CM | POA: Diagnosis not present

## 2017-11-30 DIAGNOSIS — L0291 Cutaneous abscess, unspecified: Secondary | ICD-10-CM | POA: Diagnosis not present

## 2017-11-30 MED ORDER — SULFAMETHOXAZOLE-TRIMETHOPRIM 800-160 MG PO TABS: 1.0000 | ORAL_TABLET | Freq: Two times a day (BID) | ORAL | 0 refills | Status: DC

## 2017-11-30 NOTE — Progress Notes (Signed)
BP 117/74   Pulse (!) 59   Temp 98.2 F (36.8 C)   Ht 5\' 7"  (1.702 m)   Wt 247 lb (112 kg)   BMI 38.69 kg/m    Subjective:    Patient ID: Noel Gerold, female    DOB: [birth year], 27 y.o.   MRN: [id number]  HPI: BEDIE DOMINEY is a 27 y.o. female presenting on 11/30/2017 for Hand Pain (finger infection)   HPI Left index finger cellulitis and abscess Patient is coming in today for left finger cellulitis and abscess.  She initially did doxycycline and clindamycin that started initially on 1028 when she was seen by 1 of my colleagues and just finished clindamycin yesterday and she said it finally burst open but is still very swollen and very red and very painful around the edge of the fingernail in the left index finger.  She has had this once before and had to have it drained and got antibiotic at that time.  She denies any fevers or chills or swelling or redness or warmth anywhere else besides this.  She feels like the pain is still very significant and not improving but it was improved but it did drain some but not completely.  A lot of pressure and pain in that finger.  Relevant past medical, surgical, family and social history reviewed and updated as indicated. Interim medical history since our last visit reviewed. Allergies and medications reviewed and updated.  Review of Systems  Constitutional: Negative for chills and fever.  Eyes: Negative for visual disturbance.  Respiratory: Negative for chest tightness and shortness of breath.   Cardiovascular: Negative for chest pain and leg swelling.  Musculoskeletal: Negative for back pain and gait problem.  Skin: Positive for color change and wound. Negative for rash.  Neurological: Negative for light-headedness and headaches.  Psychiatric/Behavioral: Negative for agitation and behavioral problems.  All other systems reviewed and are negative.   Per HPI unless specifically indicated above   Allergies as of 11/30/2017      Reactions    Ceclor [cefaclor] Hives   Other Itching, Swelling   Fresh fruits cause the patient's mouth and throat to itch and swell.   Peanut-containing Drug Products Itching, Swelling   All types of nuts cause the patient's mouth and throat to swell and itch.      Medication List        Accurate as of 11/30/17 10:34 AM. Always use your most recent med list.          clindamycin 300 MG capsule Commonly known as:  CLEOCIN Take 1 capsule (300 mg total) by mouth 3 (three) times daily.   doxycycline 100 MG tablet Commonly known as:  VIBRA-TABS Take 1 tablet (100 mg total) by mouth 2 (two) times daily. 1 po bid   sulfamethoxazole-trimethoprim 800-160 MG tablet Commonly known as:  BACTRIM DS,SEPTRA DS Take 1 tablet by mouth 2 (two) times daily.          Objective:    BP 117/74   Pulse (!) 59   Temp 98.2 F (36.8 C)   Ht 5\' 7"  (1.702 m)   Wt 247 lb (112 kg)   BMI 38.69 kg/m   Wt Readings from Last 3 Encounters:  11/30/17 247 lb (112 kg)  11/18/17 252 lb (114.3 kg)  07/30/17 254 lb (115.2 kg)    Physical Exam  Constitutional: She is oriented to person, place, and time. She appears well-developed and well-nourished. No distress.  Eyes: Conjunctivae  are normal.  Musculoskeletal: She exhibits no edema.  Neurological: She is alert and oriented to person, place, and time.  Skin: Skin is warm and dry. No rash noted. She is not diaphoretic. There is erythema (Erythema and swelling and small opening around the left index finger nail.  There is some cracking and splitting on that lateral aspect around the nail and has small amount of purulent drainage still has some fluctuation underneath the skin ).  Nursing note and vitals reviewed.   I&D: Region was anesthetized with ethyl chloride. Incision was made on anterior medial aspect of the wound.  Small amount of serosanguineous and purulent drainage was exuded. Culture was taken.  Simple bandage with gauze was placed over top. Bleeding was  minimal and patient tolerated procedure well.      Assessment & Plan:   Problem List Items Addressed This Visit    None    Visit Diagnoses    Abscess    -  Primary   Relevant Medications   sulfamethoxazole-trimethoprim (BACTRIM DS,SEPTRA DS) 800-160 MG tablet   Other Relevant Orders   Anaerobic and Aerobic Culture   Cellulitis of left index finger       Relevant Medications   sulfamethoxazole-trimethoprim (BACTRIM DS,SEPTRA DS) 800-160 MG tablet   Other Relevant Orders   Anaerobic and Aerobic Culture       Follow up plan: Return if symptoms worsen or fail to improve.  Counseling provided for all of the vaccine components No orders of the defined types were placed in this encounter.   Arville Care, MD [facility] 11/30/2017, 10:34 AM

## 2017-12-04 LAB — ANAEROBIC AND AEROBIC CULTURE

## 2018-02-11 ENCOUNTER — Other Ambulatory Visit: Payer: Self-pay | Admitting: Family

## 2018-02-11 ENCOUNTER — Encounter: Payer: Self-pay | Admitting: Family Medicine

## 2018-02-11 MED ORDER — OSELTAMIVIR PHOSPHATE 75 MG PO CAPS: 75.0000 mg | ORAL_CAPSULE | Freq: Two times a day (BID) | ORAL | 0 refills | Status: DC

## 2018-04-02 ENCOUNTER — Ambulatory Visit: Payer: Self-pay | Admitting: Women's Health

## 2018-10-15 DIAGNOSIS — H04123 Dry eye syndrome of bilateral lacrimal glands: Secondary | ICD-10-CM | POA: Diagnosis not present

## 2018-10-15 DIAGNOSIS — H40033 Anatomical narrow angle, bilateral: Secondary | ICD-10-CM | POA: Diagnosis not present

## 2018-11-19 ENCOUNTER — Other Ambulatory Visit: Payer: Self-pay

## 2018-11-19 DIAGNOSIS — Z20822 Contact with and (suspected) exposure to covid-19: Secondary | ICD-10-CM

## 2018-11-21 LAB — NOVEL CORONAVIRUS, NAA: SARS-CoV-2, NAA: NOT DETECTED

## 2018-12-04 DIAGNOSIS — Z20828 Contact with and (suspected) exposure to other viral communicable diseases: Secondary | ICD-10-CM | POA: Diagnosis not present

## 2018-12-15 ENCOUNTER — Other Ambulatory Visit: Payer: Self-pay

## 2018-12-15 DIAGNOSIS — Z20822 Contact with and (suspected) exposure to covid-19: Secondary | ICD-10-CM

## 2018-12-17 LAB — NOVEL CORONAVIRUS, NAA: SARS-CoV-2, NAA: NOT DETECTED

## 2019-01-13 ENCOUNTER — Other Ambulatory Visit (HOSPITAL_COMMUNITY)
Admission: RE | Admit: 2019-01-13 | Discharge: 2019-01-13 | Disposition: A | Discharge status: Home / Self Care | Payer: BC Managed Care – PPO | Source: Ambulatory Visit | Attending: Obstetrics & Gynecology | Admitting: Obstetrics & Gynecology

## 2019-01-13 ENCOUNTER — Ambulatory Visit (INDEPENDENT_AMBULATORY_CARE_PROVIDER_SITE_OTHER): Payer: BC Managed Care – PPO | Admitting: Women's Health

## 2019-01-13 ENCOUNTER — Other Ambulatory Visit: Payer: Self-pay

## 2019-01-13 ENCOUNTER — Encounter: Payer: Self-pay | Admitting: Women's Health

## 2019-01-13 VITALS — BP 110/74 | HR 89 | Ht 67.0 in | Wt 277.0 lb

## 2019-01-13 DIAGNOSIS — Z124 Encounter for screening for malignant neoplasm of cervix: Secondary | ICD-10-CM | POA: Insufficient documentation

## 2019-01-13 DIAGNOSIS — Z8742 Personal history of other diseases of the female genital tract: Secondary | ICD-10-CM

## 2019-01-13 DIAGNOSIS — Z30431 Encounter for routine checking of intrauterine contraceptive device: Secondary | ICD-10-CM | POA: Diagnosis not present

## 2019-01-13 DIAGNOSIS — R102 Pelvic and perineal pain: Secondary | ICD-10-CM | POA: Diagnosis not present

## 2019-01-13 NOTE — Addendum Note (Signed)
Addended by: Diona Fanti A on: 01/13/2019 03:37 PM   Modules accepted: Orders

## 2019-01-13 NOTE — Progress Notes (Signed)
   GYN VISIT Patient name: STEELE LEDONNE MRN [id number]  Date of birth: 02-26-1990 Chief Complaint:   pain from iud  History of Present Illness:   Kristi West is a 28 y.o. G52P2012 African American female being seen today for pain w/ IUD. Started few months ago, pain w/ sex or w/ BM, went away, now back but not as bad, 'may just be paranoid, but wants to make sure everything ok w/ IUD'. Denies abnormal discharge, itching/odor/irritation.       Patient's last menstrual period was 12/22/2018. The current method of family planning is IUD.  Last pap 06/24/2017. Results were:  ASCUS w/ -HRHPV> didn't come for repeat in June, wants to just do it w/ exam today Review of Systems:   Pertinent items are noted in HPI Denies fever/chills, dizziness, headaches, visual disturbances, fatigue, shortness of breath, chest pain, abdominal pain, vomiting, abnormal vaginal discharge/itching/odor/irritation, problems with periods, bowel movements, urination, or intercourse unless otherwise stated above.  Pertinent History Reviewed:  Reviewed past medical,surgical, social, obstetrical and family history.  Reviewed problem list, medications and allergies. Physical Assessment:   Vitals:   01/13/19 1451  BP: 110/74  Pulse: 89  Weight: 277 lb (125.6 kg)  Height: 5\' 7"  (1.702 m)  Body mass index is 43.38 kg/m.       Physical Examination:   General appearance: alert, well appearing, and in no distress  Mental status: alert, oriented to person, place, and time  Skin: warm & dry   Cardiovascular: normal heart rate noted  Respiratory: normal respiratory effort, no distress  Abdomen: soft, non-tender   Pelvic: VULVA: normal appearing vulva with no masses, tenderness or lesions, VAGINA: normal appearing vagina with normal color and discharge, no lesions, CERVIX: normal appearing cervix without discharge or lesions, IUD strings visible, appropriate length, pap obtained  Extremities: no edema   Informal TA [place]: IUD  in correct fundal placement w/in endometrium  Chaperone: Peggy Dones    No results found for this or any previous visit (from the past 24 hour(s)).  Assessment & Plan:  1) Pelvic pain> IUD in correct placement  2) Abnormal pap> w/o f/u, pap obtained today  Meds: No orders of the defined types were placed in this encounter.   No orders of the defined types were placed in this encounter.   Return for will call w/ results of pap.  [place], [facility] 01/13/2019 3:27 PM

## 2019-01-20 LAB — CYTOLOGY - PAP
Chlamydia: NEGATIVE
Comment: NEGATIVE
Comment: NEGATIVE
Comment: NORMAL
Diagnosis: NEGATIVE
High risk HPV: NEGATIVE
Neisseria Gonorrhea: NEGATIVE

## 2019-03-05 DIAGNOSIS — Z20828 Contact with and (suspected) exposure to other viral communicable diseases: Secondary | ICD-10-CM | POA: Diagnosis not present

## 2019-03-10 DIAGNOSIS — Z20828 Contact with and (suspected) exposure to other viral communicable diseases: Secondary | ICD-10-CM | POA: Diagnosis not present

## 2019-03-26 DIAGNOSIS — Z20828 Contact with and (suspected) exposure to other viral communicable diseases: Secondary | ICD-10-CM | POA: Diagnosis not present

## 2019-04-02 DIAGNOSIS — Z20828 Contact with and (suspected) exposure to other viral communicable diseases: Secondary | ICD-10-CM | POA: Diagnosis not present

## 2019-04-16 DIAGNOSIS — Z20828 Contact with and (suspected) exposure to other viral communicable diseases: Secondary | ICD-10-CM | POA: Diagnosis not present

## 2019-04-23 DIAGNOSIS — Z20828 Contact with and (suspected) exposure to other viral communicable diseases: Secondary | ICD-10-CM | POA: Diagnosis not present

## 2019-05-04 DIAGNOSIS — Z20828 Contact with and (suspected) exposure to other viral communicable diseases: Secondary | ICD-10-CM | POA: Diagnosis not present

## 2019-05-12 DIAGNOSIS — Z20828 Contact with and (suspected) exposure to other viral communicable diseases: Secondary | ICD-10-CM | POA: Diagnosis not present

## 2019-05-21 DIAGNOSIS — Z20828 Contact with and (suspected) exposure to other viral communicable diseases: Secondary | ICD-10-CM | POA: Diagnosis not present

## 2019-06-05 DIAGNOSIS — Z20828 Contact with and (suspected) exposure to other viral communicable diseases: Secondary | ICD-10-CM | POA: Diagnosis not present

## 2019-06-12 DIAGNOSIS — Z20828 Contact with and (suspected) exposure to other viral communicable diseases: Secondary | ICD-10-CM | POA: Diagnosis not present

## 2019-06-19 DIAGNOSIS — Z20828 Contact with and (suspected) exposure to other viral communicable diseases: Secondary | ICD-10-CM | POA: Diagnosis not present

## 2019-06-25 DIAGNOSIS — Z20828 Contact with and (suspected) exposure to other viral communicable diseases: Secondary | ICD-10-CM | POA: Diagnosis not present

## 2019-06-26 DIAGNOSIS — Z20828 Contact with and (suspected) exposure to other viral communicable diseases: Secondary | ICD-10-CM | POA: Diagnosis not present

## 2019-06-26 DIAGNOSIS — Z20822 Contact with and (suspected) exposure to covid-19: Secondary | ICD-10-CM | POA: Diagnosis not present

## 2019-07-03 DIAGNOSIS — Z20828 Contact with and (suspected) exposure to other viral communicable diseases: Secondary | ICD-10-CM | POA: Diagnosis not present

## 2019-07-10 DIAGNOSIS — Z20828 Contact with and (suspected) exposure to other viral communicable diseases: Secondary | ICD-10-CM | POA: Diagnosis not present

## 2019-07-21 DIAGNOSIS — Z20828 Contact with and (suspected) exposure to other viral communicable diseases: Secondary | ICD-10-CM | POA: Diagnosis not present

## 2019-08-07 DIAGNOSIS — Z20828 Contact with and (suspected) exposure to other viral communicable diseases: Secondary | ICD-10-CM | POA: Diagnosis not present

## 2019-08-14 DIAGNOSIS — Z20828 Contact with and (suspected) exposure to other viral communicable diseases: Secondary | ICD-10-CM | POA: Diagnosis not present

## 2019-09-03 DIAGNOSIS — Z20828 Contact with and (suspected) exposure to other viral communicable diseases: Secondary | ICD-10-CM | POA: Diagnosis not present

## 2019-09-03 DIAGNOSIS — R05 Cough: Secondary | ICD-10-CM | POA: Diagnosis not present

## 2019-09-04 DIAGNOSIS — R05 Cough: Secondary | ICD-10-CM | POA: Diagnosis not present

## 2019-09-04 DIAGNOSIS — Z712 Person consulting for explanation of examination or test findings: Secondary | ICD-10-CM | POA: Diagnosis not present

## 2019-09-04 DIAGNOSIS — Z20828 Contact with and (suspected) exposure to other viral communicable diseases: Secondary | ICD-10-CM | POA: Diagnosis not present

## 2019-09-11 DIAGNOSIS — Z20828 Contact with and (suspected) exposure to other viral communicable diseases: Secondary | ICD-10-CM | POA: Diagnosis not present

## 2019-09-11 DIAGNOSIS — R05 Cough: Secondary | ICD-10-CM | POA: Diagnosis not present

## 2019-09-18 DIAGNOSIS — R05 Cough: Secondary | ICD-10-CM | POA: Diagnosis not present

## 2019-09-18 DIAGNOSIS — Z20828 Contact with and (suspected) exposure to other viral communicable diseases: Secondary | ICD-10-CM | POA: Diagnosis not present

## 2019-09-19 DIAGNOSIS — R05 Cough: Secondary | ICD-10-CM | POA: Diagnosis not present

## 2019-09-19 DIAGNOSIS — Z20828 Contact with and (suspected) exposure to other viral communicable diseases: Secondary | ICD-10-CM | POA: Diagnosis not present

## 2019-09-19 DIAGNOSIS — Z712 Person consulting for explanation of examination or test findings: Secondary | ICD-10-CM | POA: Diagnosis not present

## 2019-09-25 DIAGNOSIS — R05 Cough: Secondary | ICD-10-CM | POA: Diagnosis not present

## 2019-09-25 DIAGNOSIS — Z20828 Contact with and (suspected) exposure to other viral communicable diseases: Secondary | ICD-10-CM | POA: Diagnosis not present

## 2020-12-14 ENCOUNTER — Other Ambulatory Visit: Payer: Self-pay

## 2020-12-14 ENCOUNTER — Ambulatory Visit (INDEPENDENT_AMBULATORY_CARE_PROVIDER_SITE_OTHER): Payer: Commercial Managed Care - PPO | Admitting: Adult Health

## 2020-12-14 ENCOUNTER — Encounter: Payer: Self-pay | Admitting: Adult Health

## 2020-12-14 VITALS — BP 115/77 | HR 65 | Ht 67.0 in | Wt 259.6 lb

## 2020-12-14 DIAGNOSIS — Z30432 Encounter for removal of intrauterine contraceptive device: Secondary | ICD-10-CM | POA: Diagnosis not present

## 2020-12-14 NOTE — Progress Notes (Signed)
  Subjective:     Patient ID: Kristi West, female   DOB: Jan 21, 1991, 30 y.o.   MRN: [id number]  HPI Kristi West is a 30 year old black female,married, [id number], in for IUD removal. She has had IUD since 2019 and having irregular periods and cramping and wants it removed.  PCP is Robyne Askew FNP  Lab Results  Component Value Date   DIAGPAP  01/13/2019    - Negative for intraepithelial lesion or malignancy (NILM)   HPV NOT DETECTED 06/24/2017   HPVHIGH Negative 01/13/2019    Review of Systems For IUD removal Has had irregular periods and cramping Some decreased libido,should be better now that IUD out   Reviewed past medical,surgical, social and family history. Reviewed medications and allergies.     Objective:   Physical Exam BP 115/77 (BP Location: Left Arm, Patient Position: Sitting, Cuff Size: Normal)   Pulse 65   Ht 5\' 7"  (1.702 m)   Wt 259 lb 9.6 oz (117.8 kg)   BMI 40.66 kg/m     Consent signed. Skin warm and dry.Pelvic: external genitalia is normal in appearance no lesions, vagina: pink with good moisture and rugae,urethra has no lesions or masses noted, cervix:bulbous, has 2 small nabothian cysts at 11 0' clock, IUD strings at os, grasped with ring forceps and asked pt to cough and IUD easily removed,uterus: normal size, shape and contour, non tender, no masses felt, adnexa: no masses or tenderness noted. Bladder is non tender and no masses felt.   Upstream - 12/14/20 0930       Pregnancy Intention Screening   Does the patient want to become pregnant in the next year? No    Does the patient's partner want to become pregnant in the next year? No    Would the patient like to discuss contraceptive options today? No      Contraception Wrap Up   Current Method IUD or IUS    End Method Female Condom    Contraception Counseling Provided No            Chaperoned by 12/16/20, NP student. Assessment:     1. Encounter for IUD removal     Plan:     Return in 1 year for  pap and physical

## 2021-01-22 NOTE — L&D Delivery Note (Signed)
[facility]N [facility] Delivery Note  Kristi West is a 31 y.o. [id number] s/p SVB at [redacted]w[redacted]d. She was admitted for active labor/transition.   ROM: 0h 18m with clear fluid GBS Status: neg Maximum Maternal Temperature: (none taken)  Labor Progress: Kristi West arrived at 6cm, progressing quickly to 9cm by the time she was in her L&D room, and began pushing shortly after with the use of nitrous.  Delivery Date/Time: August 17th, 2023 at 0329 Delivery: Called to room and patient was complete and pushing and had labored precipitously. Head delivered ROA and en caul. No nuchal cord present. Shoulder and body delivered in usual fashion. Infant with spontaneous cry, placed on mother's abdomen, dried and stimulated. Cord clamped x 2 after 5-minute delay, and cut by CNM. Cord blood drawn. Placenta delivered spontaneously with gentle cord traction. Fundus firm with massage and Pitocin. Labia, perineum, vagina, and cervix inspected and found to be intact.   Placenta: spont, intact; to L&D Complications: none Lacerations: none EBL: 101cc Analgesia: nitrous  Postpartum Planning [x]  message to sent to schedule follow-up   Infant: girl  APGARs 8/9  3220g (7lb 1.6oz)  10/9, CNM  09/07/2021 3:59 AM

## 2021-01-26 ENCOUNTER — Ambulatory Visit (INDEPENDENT_AMBULATORY_CARE_PROVIDER_SITE_OTHER): Payer: Commercial Managed Care - PPO | Admitting: *Deleted

## 2021-01-26 ENCOUNTER — Encounter: Payer: Self-pay | Admitting: *Deleted

## 2021-01-26 ENCOUNTER — Other Ambulatory Visit: Payer: Self-pay

## 2021-01-26 VITALS — Ht 67.0 in

## 2021-01-26 DIAGNOSIS — Z3201 Encounter for pregnancy test, result positive: Secondary | ICD-10-CM | POA: Diagnosis not present

## 2021-01-26 LAB — POCT URINE PREGNANCY: Preg Test, Ur: POSITIVE — AB

## 2021-01-26 MED ORDER — PROMETHAZINE HCL 25 MG PO TABS: 25.0000 mg | ORAL_TABLET | Freq: Four times a day (QID) | ORAL | 1 refills | Status: DC | PRN

## 2021-01-26 NOTE — Progress Notes (Signed)
Chart reviewed for nurse visit. Agree with plan of care. Will rx phenergan  Adline Potter, NP 01/26/2021 12:42 PM

## 2021-01-26 NOTE — Progress Notes (Signed)
° °  NURSE VISIT- PREGNANCY CONFIRMATION   SUBJECTIVE:  Kristi West is a 31 y.o. [phone] female at [redacted]w[redacted]d by certain LMP of Patient's last menstrual period was 12/14/2020. Here for pregnancy confirmation.  Home pregnancy test: positive x 3   She reports nausea.  She is taking prenatal vitamins.    OBJECTIVE:  Ht 5\' 7"  (1.702 m)    LMP 12/14/2020    BMI 40.66 kg/m   Appears well, in no apparent distress  Results for orders placed or performed in visit on 01/26/21 (from the past 24 hour(s))  POCT urine pregnancy   Collection Time: 01/26/21 10:20 AM  Result Value Ref Range   Preg Test, Ur Positive (A) Negative    ASSESSMENT: Positive pregnancy test, [redacted]w[redacted]d by LMP    PLAN: Schedule for dating ultrasound in 2-3 weeks Prenatal vitamins: continue   Nausea medicines: requested-note routed to [facility] to send prescription   OB packet given: Yes  [facility]  01/26/2021 10:21 AM

## 2021-01-26 NOTE — Addendum Note (Signed)
Addended by: Cyril Mourning A on: 01/26/2021 12:42 PM   Modules accepted: Orders

## 2021-02-20 ENCOUNTER — Other Ambulatory Visit: Payer: Self-pay | Admitting: Obstetrics & Gynecology

## 2021-02-20 DIAGNOSIS — O3680X0 Pregnancy with inconclusive fetal viability, not applicable or unspecified: Secondary | ICD-10-CM

## 2021-02-21 ENCOUNTER — Other Ambulatory Visit: Payer: Self-pay

## 2021-02-21 ENCOUNTER — Ambulatory Visit (INDEPENDENT_AMBULATORY_CARE_PROVIDER_SITE_OTHER): Payer: Commercial Managed Care - PPO

## 2021-02-21 DIAGNOSIS — O3680X0 Pregnancy with inconclusive fetal viability, not applicable or unspecified: Secondary | ICD-10-CM | POA: Diagnosis not present

## 2021-02-21 DIAGNOSIS — Z3A09 9 weeks gestation of pregnancy: Secondary | ICD-10-CM | POA: Diagnosis not present

## 2021-02-21 NOTE — Progress Notes (Signed)
[place] 9+6 wks,single IUP,CRL 34.57 mm,FHR 169 bpm,normal ovaries

## 2021-03-08 DIAGNOSIS — Z3490 Encounter for supervision of normal pregnancy, unspecified, unspecified trimester: Secondary | ICD-10-CM | POA: Insufficient documentation

## 2021-03-09 ENCOUNTER — Ambulatory Visit: Payer: Commercial Managed Care - PPO | Admitting: *Deleted

## 2021-03-09 ENCOUNTER — Other Ambulatory Visit: Payer: Self-pay

## 2021-03-09 ENCOUNTER — Ambulatory Visit (INDEPENDENT_AMBULATORY_CARE_PROVIDER_SITE_OTHER): Payer: Commercial Managed Care - PPO | Admitting: Women's Health

## 2021-03-09 ENCOUNTER — Encounter: Payer: Self-pay | Admitting: Women's Health

## 2021-03-09 ENCOUNTER — Other Ambulatory Visit: Payer: Self-pay | Admitting: Obstetrics & Gynecology

## 2021-03-09 VITALS — BP 122/81 | HR 74 | Wt 257.0 lb

## 2021-03-09 DIAGNOSIS — I4891 Unspecified atrial fibrillation: Secondary | ICD-10-CM

## 2021-03-09 DIAGNOSIS — Z3481 Encounter for supervision of other normal pregnancy, first trimester: Secondary | ICD-10-CM

## 2021-03-09 DIAGNOSIS — Z3480 Encounter for supervision of other normal pregnancy, unspecified trimester: Secondary | ICD-10-CM

## 2021-03-09 DIAGNOSIS — Z3A12 12 weeks gestation of pregnancy: Secondary | ICD-10-CM

## 2021-03-09 DIAGNOSIS — Z131 Encounter for screening for diabetes mellitus: Secondary | ICD-10-CM

## 2021-03-09 LAB — POCT URINALYSIS DIPSTICK OB
Blood, UA: NEGATIVE
Glucose, UA: NEGATIVE
Ketones, UA: NEGATIVE
Leukocytes, UA: NEGATIVE
Nitrite, UA: NEGATIVE

## 2021-03-09 MED ORDER — ASPIRIN 81 MG PO TBEC: 162.0000 mg | DELAYED_RELEASE_TABLET | Freq: Every day | ORAL | 3 refills | Status: DC

## 2021-03-09 MED ORDER — ASPIRIN 81 MG PO TBEC: 81.0000 mg | DELAYED_RELEASE_TABLET | Freq: Every day | ORAL | 3 refills | Status: DC

## 2021-03-09 NOTE — Progress Notes (Signed)
INITIAL OBSTETRICAL VISIT Patient name: Kristi West MRN [id number]  Date of birth: 1990/12/27 Chief Complaint:   Initial Prenatal Visit  History of Present Illness:   Kristi West is a 31 y.o. [phone] African-American female at [redacted]w[redacted]d by LMP c/w u/s at 10 weeks with an Estimated Date of Delivery: 09/20/21 being seen today for her initial obstetrical visit.   Patient's last menstrual period was 12/14/2020. Her obstetrical history is significant for  ab x 1, term uncomplicated SVB x 2 .  0000000 pregnancy had Afib at 36wks, converted w/ flecainide. Couple of episodes in 2019 that self-resolved, started on diltiazem to suppress episodes. No longer taking, no recent problems.  Today she reports  mild nausea, declines meds .  Last pap 01/13/19. Results were: NILM w/ HRHPV negative  Depression screen [facility] 2/9 03/09/2021 11/18/2017 06/24/2017 07/16/2016 07/02/2016  Decreased Interest 1 0 0 0 0  Down, Depressed, Hopeless 1 0 0 0 0  PHQ - 2 Score 2 0 0 0 0  Altered sleeping 0 - - - -  Tired, decreased energy 2 - - - -  Change in appetite 1 - - - -  Feeling bad or failure about yourself  1 - - - -  Trouble concentrating 0 - - - -  Moving slowly or fidgety/restless 0 - - - -  Suicidal thoughts 0 - - - -  PHQ-9 Score 6 - - - -     GAD 7 : Generalized Anxiety Score 03/09/2021  Nervous, Anxious, on Edge 2  Control/stop worrying 1  Worry too much - different things 1  Trouble relaxing 1  Restless 0  Easily annoyed or irritable 1  Afraid - awful might happen 2  Total GAD 7 Score 8     Review of Systems:   Pertinent items are noted in HPI Denies cramping/contractions, leakage of fluid, vaginal bleeding, abnormal vaginal discharge w/ itching/odor/irritation, headaches, visual changes, shortness of breath, chest pain, abdominal pain, severe nausea/vomiting, or problems with urination or bowel movements unless otherwise stated above.  Pertinent History Reviewed:  Reviewed past medical,surgical,  social, obstetrical and family history.  Reviewed problem list, medications and allergies. OB History  Gravida Para Term Preterm AB Living  4 2 2   1 2   SAB IAB Ectopic Multiple Live Births        0 2    # Outcome Date GA Lbr Len/2nd Weight Sex Delivery Anes PTL Lv  4 Current           3 Term 08/31/15 [redacted]w[redacted]d 02:14 / 00:40 6 lb 7.5 oz (2.935 kg) F Vag-Spont EPI N LIV  2 Term 12/25/12 [redacted]w[redacted]d 03:48 / 00:47 7 lb 0.5 oz (3.189 kg) M Vag-Spont EPI N LIV  1 AB            Physical Assessment:   Vitals:   03/09/21 0950  BP: 122/81  Pulse: 74  Weight: 257 lb (116.6 kg)  Body mass index is 40.25 kg/m.       Physical Examination:  General appearance - well appearing, and in no distress  Mental status - alert, oriented to person, place, and time  Psych:  She has a normal mood and affect  Skin - warm and dry, normal color, no suspicious lesions noted  Chest - effort normal, all lung fields clear to auscultation bilaterally  Heart - normal rate and regular rhythm  Abdomen - soft, nontender  Extremities:  No swelling or varicosities noted  Thin  prep pap is not done   Chaperone: N/A    Unable to hear FHR w/ doppler, informal TA u/s w/ +FCA and active fetus  Results for orders placed or performed in visit on 03/09/21 (from the past 24 hour(s))  POC Urinalysis Dipstick OB   Collection Time: 03/09/21 10:20 AM  Result Value Ref Range   Color, UA     Clarity, UA     Glucose, UA Negative Negative   Bilirubin, UA     Ketones, UA neg    Spec Grav, UA     Blood, UA neg    pH, UA     POC,PROTEIN,UA Trace Negative, Trace, Small (1+), Moderate (2+), Large (3+), 4+   Urobilinogen, UA     Nitrite, UA neg    Leukocytes, UA Negative Negative   Appearance     Odor      Assessment & Plan:  1) Low-Risk Pregnancy [id number] at [redacted]w[redacted]d with an Estimated Date of Delivery: 09/20/21   2) Initial OB visit  3) H/O Afib> 2017 pregnancy at 36wks, converted w/ flecainide, couple of episodes in 2019 that  self-resolved, started on diltiazem for suppression. No longer taking. Discussed w/ L[facility], start ASA 162mg , he rx'd flecainide to have for prn use. Pt to make cardiology f/u (last visit 2019)  Meds:  Meds ordered this encounter  Medications   aspirin 81 MG EC tablet    Sig: Take 1 tablet (81 mg total) by mouth daily. Swallow whole.    Dispense:  90 tablet    Refill:  3    Order Specific Question:   Supervising Provider    Answer:   Tania Ade H [[id number]]    Initial labs obtained Continue prenatal vitamins Reviewed n/v relief measures and warning s/s to report Reviewed recommended weight West based on pre-gravid BMI Encouraged well-balanced diet Genetic & carrier screening discussed: requests Panorama and AFP, declines Horizon , no NT u/s avaiable  Ultrasound discussed; fetal survey: requested CCNC completed> form faxed if has or is planning to apply for medicaid The nature of [place] for [employer] with multiple MDs and other Advanced Practice Providers was explained to patient; also emphasized that fellows, residents, and students are part of our team. Does not have home bp cuff. Office bp cuff given: yes. Rx sent: no. Check bp weekly, let us know if consistently >140/90.   Indications for ASA therapy (per uptodate) OR Two or more of the following: Obesity (BMI>30 kg/m2) Yes Sociodemographic characteristics (African American race, low socioeconomic level) Yes  Indications for early A1C (per uptodate) BMI >=25 (>=23 in Asian women) AND one of the following High-risk race/ethnicity (eg, African American, Latino, Native American, [place] American, [place]) Yes  Follow-up: Return in about 4 weeks (around 04/06/2021) for LROB, AFP, CNM, in person.   Orders Placed This Encounter  Procedures   Urine Culture   GC/Chlamydia Probe Amp   Genetic Screening   CBC/D/Plt+RPR+Rh+ABO+RubIgG...   Hemoglobin A1c   POC Urinalysis Dipstick OB    Roma Schanz CNM,  [facility] 03/09/2021 10:55 AM

## 2021-03-09 NOTE — Patient Instructions (Signed)
Kristi West, thank you for choosing our office today! We appreciate the opportunity to meet your healthcare needs. You may receive a short survey by mail, e-mail, or through [employer]. If you are happy with your care we would appreciate if you could take just a few minutes to complete the survey questions. We read all of your comments and take your feedback very seriously. Thank you again for choosing our office.  Center for [employer] [facility] Team at [facility]  [facility] at [facility] ([address] [place], [place] [address]) Entrance C, located off of [address] Free 24/7 valet parking   Nausea & Vomiting Have saltine crackers or pretzels by your bed and eat a few bites before you raise your head out of bed in the morning Eat small frequent meals throughout the day instead of large meals Drink plenty of fluids throughout the day to stay hydrated, just don't drink a lot of fluids with your meals.  This can make your stomach fill up faster making you feel sick Do not brush your teeth right after you eat Products with real ginger are good for nausea, like ginger ale and ginger hard candy Make sure it says made with real ginger! Sucking on sour candy like lemon heads is also good for nausea If your prenatal vitamins make you nauseated, take them at night so you will sleep through the nausea Sea Bands If you feel like you need medicine for the nausea & vomiting please let us know If you are unable to keep any fluids or food down please let us know   Constipation Drink plenty of fluid, preferably water, throughout the day Eat foods high in fiber such as fruits, vegetables, and grains Exercise, such as walking, is a good way to keep your bowels regular Drink warm fluids, especially warm prune juice, or decaf coffee Eat a 1/2 cup of real oatmeal (not instant), 1/2 cup applesauce, and 1/2-1 cup warm prune juice every day If needed, you may take Colace (docusate sodium) stool softener  once or twice a day to help keep the stool soft.  If you still are having problems with constipation, you may take Miralax once daily as needed to help keep your bowels regular.   Home Blood Pressure Monitoring for Patients   Your provider has recommended that you check your blood pressure (BP) at least once a week at home. If you do not have a blood pressure cuff at home, one will be provided for you. Contact your provider if you have not received your monitor within 1 week.   Helpful Tips for Accurate Home Blood Pressure Checks  Don't smoke, exercise, or drink caffeine 30 minutes before checking your BP Use the restroom before checking your BP (a full bladder can raise your pressure) Relax in a comfortable upright chair Feet on the ground Left arm resting comfortably on a flat surface at the level of your heart Legs uncrossed Back supported Sit quietly and don't talk Place the cuff on your bare arm Adjust snuggly, so that only two fingertips can fit between your skin and the top of the cuff Check 2 readings separated by at least one minute Keep a log of your BP readings For a visual, please reference this diagram: http://ccnc.care/bpdiagram  Provider Name: [facility]     Phone: [phone]  Zone 1: ALL CLEAR  Continue to monitor your symptoms:  BP reading is less than 140 (top number) or less than 90 (bottom  number)  No right upper stomach pain No headaches or seeing spots No feeling nauseated or throwing up No swelling in face and hands  Zone 2: CAUTION Call your doctor's office for any of the following:  BP reading is greater than 140 (top number) or greater than 90 (bottom number)  Stomach pain under your ribs in the middle or right side Headaches or seeing spots Feeling nauseated or throwing up Swelling in face and hands  Zone 3: EMERGENCY  Seek immediate medical care if you have any of the following:  BP reading is greater than160 (top number) or greater than  110 (bottom number) Severe headaches not improving with Tylenol Serious difficulty catching your breath Any worsening symptoms from Zone 2    First Trimester of Pregnancy The first trimester of pregnancy is from week 1 until the end of week 12 (months 1 through 3). A week after a sperm fertilizes an egg, the egg will implant on the wall of the uterus. This embryo will begin to develop into a baby. Genes from you and your partner are forming the baby. The female genes determine whether the baby is a boy or a girl. At 6-8 weeks, the eyes and face are formed, and the heartbeat can be seen on ultrasound. At the end of 12 weeks, all the baby's organs are formed.  Now that you are pregnant, you will want to do everything you can to have a healthy baby. Two of the most important things are to get good prenatal care and to follow your health care provider's instructions. Prenatal care is all the medical care you receive before the baby's birth. This care will help prevent, find, and treat any problems during the pregnancy and childbirth. BODY CHANGES Your body goes through many changes during pregnancy. The changes vary from woman to woman.  You may gain or lose a couple of pounds at first. You may feel sick to your stomach (nauseous) and throw up (vomit). If the vomiting is uncontrollable, call your health care provider. You may tire easily. You may develop headaches that can be relieved by medicines approved by your health care provider. You may urinate more often. Painful urination may mean you have a bladder infection. You may develop heartburn as a result of your pregnancy. You may develop constipation because certain hormones are causing the muscles that push waste through your intestines to slow down. You may develop hemorrhoids or swollen, bulging veins (varicose veins). Your breasts may begin to grow larger and become tender. Your nipples may stick out more, and the tissue that surrounds them  (areola) may become darker. Your gums may bleed and may be sensitive to brushing and flossing. Dark spots or blotches (chloasma, mask of pregnancy) may develop on your face. This will likely fade after the baby is born. Your menstrual periods will stop. You may have a loss of appetite. You may develop cravings for certain kinds of food. You may have changes in your emotions from day to day, such as being excited to be pregnant or being concerned that something may go wrong with the pregnancy and baby. You may have more vivid and strange dreams. You may have changes in your hair. These can include thickening of your hair, rapid growth, and changes in texture. Some women also have hair loss during or after pregnancy, or hair that feels dry or thin. Your hair will most likely return to normal after your baby is born. WHAT TO EXPECT AT YOUR PRENATAL   VISITS During a routine prenatal visit: You will be weighed to make sure you and the baby are growing normally. Your blood pressure will be taken. Your abdomen will be measured to track your baby's growth. The fetal heartbeat will be listened to starting around week 10 or 12 of your pregnancy. Test results from any previous visits will be discussed. Your health care provider may ask you: How you are feeling. If you are feeling the baby move. If you have had any abnormal symptoms, such as leaking fluid, bleeding, severe headaches, or abdominal cramping. If you have any questions. Other tests that may be performed during your first trimester include: Blood tests to find your blood type and to check for the presence of any previous infections. They will also be used to check for low iron levels (anemia) and Rh antibodies. Later in the pregnancy, blood tests for diabetes will be done along with other tests if problems develop. Urine tests to check for infections, diabetes, or protein in the urine. An ultrasound to confirm the proper growth and development  of the baby. An amniocentesis to check for possible genetic problems. Fetal screens for spina bifida and Down syndrome. You may need other tests to make sure you and the baby are doing well. HOME CARE INSTRUCTIONS  Medicines Follow your health care provider's instructions regarding medicine use. Specific medicines may be either safe or unsafe to take during pregnancy. Take your prenatal vitamins as directed. If you develop constipation, try taking a stool softener if your health care provider approves. Diet Eat regular, well-balanced meals. Choose a variety of foods, such as meat or vegetable-based protein, fish, milk and low-fat dairy products, vegetables, fruits, and whole grain breads and cereals. Your health care provider will help you determine the amount of weight gain that is right for you. Avoid raw meat and uncooked cheese. These carry germs that can cause birth defects in the baby. Eating four or five small meals rather than three large meals a day may help relieve nausea and vomiting. If you start to feel nauseous, eating a few soda crackers can be helpful. Drinking liquids between meals instead of during meals also seems to help nausea and vomiting. If you develop constipation, eat more high-fiber foods, such as fresh vegetables or fruit and whole grains. Drink enough fluids to keep your urine clear or pale yellow. Activity and Exercise Exercise only as directed by your health care provider. Exercising will help you: Control your weight. Stay in shape. Be prepared for labor and delivery. Experiencing pain or cramping in the lower abdomen or low back is a good sign that you should stop exercising. Check with your health care provider before continuing normal exercises. Try to avoid standing for long periods of time. Move your legs often if you must stand in one place for a long time. Avoid heavy lifting. Wear low-heeled shoes, and practice good posture. You may continue to have sex  unless your health care provider directs you otherwise. Relief of Pain or Discomfort Wear a good support bra for breast tenderness.   Take warm sitz baths to soothe any pain or discomfort caused by hemorrhoids. Use hemorrhoid cream if your health care provider approves.   Rest with your legs elevated if you have leg cramps or low back pain. If you develop varicose veins in your legs, wear support hose. Elevate your feet for 15 minutes, 3-4 times a day. Limit salt in your diet. Prenatal Care Schedule your prenatal visits by the   twelfth week of pregnancy. They are usually scheduled monthly at first, then more often in the last 2 months before delivery. Write down your questions. Take them to your prenatal visits. Keep all your prenatal visits as directed by your health care provider. Safety Wear your seat belt at all times when driving. Make a list of emergency phone numbers, including numbers for family, friends, the hospital, and police and fire departments. General Tips Ask your health care provider for a referral to a local prenatal education class. Begin classes no later than at the beginning of month 6 of your pregnancy. Ask for help if you have counseling or nutritional needs during pregnancy. Your health care provider can offer advice or refer you to specialists for help with various needs. Do not use hot tubs, steam rooms, or saunas. Do not douche or use tampons or scented sanitary pads. Do not cross your legs for long periods of time. Avoid cat litter boxes and soil used by cats. These carry germs that can cause birth defects in the baby and possibly loss of the fetus by miscarriage or stillbirth. Avoid all smoking, herbs, alcohol, and medicines not prescribed by your health care provider. Chemicals in these affect the formation and growth of the baby. Schedule a dentist appointment. At home, brush your teeth with a soft toothbrush and be gentle when you floss. SEEK MEDICAL CARE IF:   You have dizziness. You have mild pelvic cramps, pelvic pressure, or nagging pain in the abdominal area. You have persistent nausea, vomiting, or diarrhea. You have a bad smelling vaginal discharge. You have pain with urination. You notice increased swelling in your face, hands, legs, or ankles. SEEK IMMEDIATE MEDICAL CARE IF:  You have a fever. You are leaking fluid from your vagina. You have spotting or bleeding from your vagina. You have severe abdominal cramping or pain. You have rapid weight gain or loss. You vomit blood or material that looks like coffee grounds. You are exposed to German measles and have never had them. You are exposed to fifth disease or chickenpox. You develop a severe headache. You have shortness of breath. You have any kind of trauma, such as from a fall or a car accident. Document Released: 01/02/2001 Document Revised: 05/25/2013 Document Reviewed: 11/18/2012 ExitCare Patient Information 2015 [employer]. This information is not intended to replace advice given to you by your health care provider. Make sure you discuss any questions you have with your health care provider.  

## 2021-03-10 ENCOUNTER — Other Ambulatory Visit: Payer: Self-pay | Admitting: Obstetrics & Gynecology

## 2021-03-10 LAB — CBC/D/PLT+RPR+RH+ABO+RUBIGG...
Antibody Screen: NEGATIVE
Basophils Absolute: 0 10*3/uL (ref 0.0–0.2)
Basos: 0 %
EOS (ABSOLUTE): 0.1 10*3/uL (ref 0.0–0.4)
Eos: 1 %
HCV Ab: NONREACTIVE
HIV Screen 4th Generation wRfx: NONREACTIVE
Hematocrit: 37.8 % (ref 34.0–46.6)
Hemoglobin: 13.2 g/dL (ref 11.1–15.9)
Hepatitis B Surface Ag: NEGATIVE
Immature Grans (Abs): 0 10*3/uL (ref 0.0–0.1)
Immature Granulocytes: 0 %
Lymphocytes Absolute: 1.7 10*3/uL (ref 0.7–3.1)
Lymphs: 25 %
MCH: 30.8 pg (ref 26.6–33.0)
MCHC: 34.9 g/dL (ref 31.5–35.7)
MCV: 88 fL (ref 79–97)
Monocytes Absolute: 0.5 10*3/uL (ref 0.1–0.9)
Monocytes: 8 %
Neutrophils Absolute: 4.4 10*3/uL (ref 1.4–7.0)
Neutrophils: 66 %
Platelets: 221 10*3/uL (ref 150–450)
RBC: 4.29 x10E6/uL (ref 3.77–5.28)
RDW: 12.5 % (ref 11.7–15.4)
RPR Ser Ql: NONREACTIVE
Rh Factor: POSITIVE
Rubella Antibodies, IGG: 5.57 index (ref >0.99)
WBC: 6.8 10*3/uL (ref 3.4–10.8)

## 2021-03-10 LAB — HCV INTERPRETATION

## 2021-03-10 LAB — HEMOGLOBIN A1C
Est. average glucose Bld gHb Est-mCnc: 108 mg/dL
Hgb A1c MFr Bld: 5.4 % (ref 4.8–5.6)

## 2021-03-10 MED ORDER — FLECAINIDE ACETATE 100 MG PO TABS
ORAL_TABLET | ORAL | 1 refills | Status: DC
Start: 2021-03-10 — End: 2021-09-08

## 2021-03-12 LAB — URINE CULTURE

## 2021-03-13 LAB — GC/CHLAMYDIA PROBE AMP
Chlamydia trachomatis, NAA: NEGATIVE
Neisseria Gonorrhoeae by PCR: NEGATIVE

## 2021-03-20 ENCOUNTER — Encounter: Payer: Self-pay | Admitting: Women's Health

## 2021-03-24 ENCOUNTER — Encounter: Payer: Self-pay | Admitting: Women's Health

## 2021-03-27 ENCOUNTER — Encounter: Payer: Self-pay | Admitting: Women's Health

## 2021-03-28 ENCOUNTER — Encounter: Payer: Self-pay | Admitting: Women's Health

## 2021-04-10 ENCOUNTER — Other Ambulatory Visit: Payer: Self-pay

## 2021-04-10 ENCOUNTER — Ambulatory Visit (INDEPENDENT_AMBULATORY_CARE_PROVIDER_SITE_OTHER): Payer: Commercial Managed Care - PPO | Admitting: Advanced Practice Midwife

## 2021-04-10 VITALS — BP 130/83 | HR 70 | Wt 262.0 lb

## 2021-04-10 DIAGNOSIS — Z363 Encounter for antenatal screening for malformations: Secondary | ICD-10-CM

## 2021-04-10 DIAGNOSIS — Z3480 Encounter for supervision of other normal pregnancy, unspecified trimester: Secondary | ICD-10-CM

## 2021-04-10 DIAGNOSIS — Z1379 Encounter for other screening for genetic and chromosomal anomalies: Secondary | ICD-10-CM

## 2021-04-10 DIAGNOSIS — Z3482 Encounter for supervision of other normal pregnancy, second trimester: Secondary | ICD-10-CM

## 2021-04-10 DIAGNOSIS — Z3A16 16 weeks gestation of pregnancy: Secondary | ICD-10-CM

## 2021-04-10 NOTE — Progress Notes (Signed)
? ?  LOW-RISK PREGNANCY VISIT ?Patient name: Kristi West MRN [id number]  Date of birth: 06-25-1990 ?Chief Complaint:   ?Routine Prenatal Visit ? ?History of Present Illness:   ?Kristi West is a 31 y.o. [phone] female at [redacted]w[redacted]d with an Estimated Date of Delivery: 09/20/21 being seen today for ongoing management of a low-risk pregnancy.  ?Today she reports no complaints. Contractions: Not present. Vag. Bleeding: None.  Movement: Present. denies leaking of fluid. ?Review of Systems:   ?Pertinent items are noted in HPI ?Denies abnormal vaginal discharge w/ itching/odor/irritation, headaches, visual changes, shortness of breath, chest pain, abdominal pain, severe nausea/vomiting, or problems with urination or bowel movements unless otherwise stated above. ?Pertinent History Reviewed:  ?Reviewed past medical,surgical, social, obstetrical and family history.  ?Reviewed problem list, medications and allergies. ?Physical Assessment:  ? ?Vitals:  ? 04/10/21 0845  ?BP: 130/83  ?Pulse: 70  ?Weight: 262 lb (118.8 kg)  ?Body mass index is 41.04 kg/m?. ?  ?     Physical Examination:  ? General appearance: Well appearing, and in no distress ? Mental status: Alert, oriented to person, place, and time ? Skin: Warm & dry ? Cardiovascular: Normal heart rate noted ? Respiratory: Normal respiratory effort, no distress ? Abdomen: Soft, gravid, nontender ? Pelvic: Cervical exam deferred        ? Extremities: Edema: None ? ?Fetal Status: Fetal Heart Rate (bpm): 152   Movement: Present   ? ?Chaperone: n/a   ? ?No results found for this or any previous visit (from the past 24 hour(s)).  ?Assessment & Plan:  ?1) Low-risk pregnancy [id number] at [redacted]w[redacted]d with an Estimated Date of Delivery: 09/20/21  ? ?2) Borderline (for CHTN) BPs,  start taking once a week  ?  ?Meds: No orders of the defined types were placed in this encounter. ? ?Labs/procedures today: 2nd IT ? ?Plan:  Continue routine obstetrical care  ?Next visit: prefers will be in person for  anatomy scan    ? ?Reviewed: Preterm labor symptoms and general obstetric precautions including but not limited to vaginal bleeding, contractions, leaking of fluid and fetal movement were reviewed in detail with the patient.  All questions were answered. Has home bp cuff.. Check bp weekly, let us know if >140/90.  ? ?Follow-up: Return in about 3 weeks (around 05/01/2021) for LROB, [id number]. ? ?Orders Placed This Encounter  ?Procedures  ? US OB Comp + 14 Wk  ? AFP TETRA  ? ?Christin Fudge DNP, CNM ?04/10/2021 ?9:10 AM ? ?

## 2021-04-10 NOTE — Patient Instructions (Addendum)
Kristi West, I greatly value your feedback.  If you receive a survey following your visit with [place] today, we appreciate you taking the time to fill it out.  ?Thanks, ?Nigel Berthold, CNM ? ? ? ? ?[place]!!! ?It is now [facility] [place] at [facility] ?([address] [place], [place] [address]) ?Entrance located off of [address] ?Free 24/7 valet parking  ? ?Go to [email or web address] to register for FREE online childbirth classes  ? ? ?Second Trimester of Pregnancy ?The second trimester is from week 14 through week 27 (months 4 through 6). The second trimester is often a time when you feel your best. Your body has adjusted to being pregnant, and you begin to feel better physically. Usually, morning sickness has lessened or quit completely, you may have more energy, and you may have an increase in appetite. The second trimester is also a time when the fetus is growing rapidly. At the end of the sixth month, the fetus is about 9 inches long and weighs about 1? pounds. You will likely begin to feel the baby move (quickening) between 16 and 20 weeks of pregnancy. ?Body changes during your second trimester ?Your body continues to go through many changes during your second trimester. The changes vary from woman to woman. ?Your weight will continue to increase. You will notice your lower abdomen bulging out. ?You may begin to get stretch marks on your hips, abdomen, and breasts. ?You may develop headaches that can be relieved by medicines. The medicines should be approved by your health care provider. ?You may urinate more often because the fetus is pressing on your bladder. ?You may develop or continue to have heartburn as a result of your pregnancy. ?You may develop constipation because certain hormones are causing the muscles that push waste through your intestines to slow down. ?You may develop hemorrhoids or swollen, bulging veins (varicose veins). ?You may have back pain. This is  caused by: ?Weight gain. ?Pregnancy hormones that are relaxing the joints in your pelvis. ?A shift in weight and the muscles that support your balance. ?Your breasts will continue to grow and they will continue to become tender. ?Your gums may bleed and may be sensitive to brushing and flossing. ?Dark spots or blotches (chloasma, mask of pregnancy) may develop on your face. This will likely fade after the baby is born. ?A dark line from your belly button to the pubic area (linea nigra) may appear. This will likely fade after the baby is born. ?You may have changes in your hair. These can include thickening of your hair, rapid growth, and changes in texture. Some women also have hair loss during or after pregnancy, or hair that feels dry or thin. Your hair will most likely return to normal after your baby is born. ? ?What to expect at prenatal visits ?During a routine prenatal visit: ?You will be weighed to make sure you and the fetus are growing normally. ?Your blood pressure will be taken. ?Your abdomen will be measured to track your baby's growth. ?The fetal heartbeat will be listened to. ?Any test results from the previous visit will be discussed. ? ?Your health care provider may ask you: ?How you are feeling. ?If you are feeling the baby move. ?If you have had any abnormal symptoms, such as leaking fluid, bleeding, severe headaches, or abdominal cramping. ?If you are using any tobacco products, including cigarettes, chewing tobacco, and electronic cigarettes. ?If you have any questions. ? ?Other tests  that may be performed during your second trimester include: ?Blood tests that check for: ?Low iron levels (anemia). ?High blood sugar that affects pregnant women (gestational diabetes) between 81 and 28 weeks. ?Rh antibodies. This is to check for a protein on red blood cells (Rh factor). ?Urine tests to check for infections, diabetes, or protein in the urine. ?An ultrasound to confirm the proper growth and  development of the baby. ?An amniocentesis to check for possible genetic problems. ?Fetal screens for spina bifida and Down syndrome. ?HIV (human immunodeficiency virus) testing. Routine prenatal testing includes screening for HIV, unless you choose not to have this test. ? ?Follow these instructions at home: ?Medicines ?Follow your health care provider's instructions regarding medicine use. Specific medicines may be either safe or unsafe to take during pregnancy. ?Take a prenatal vitamin that contains at least 600 micrograms (mcg) of folic acid. ?If you develop constipation, try taking a stool softener if your health care provider approves. ?Eating and drinking ?Eat a balanced diet that includes fresh fruits and vegetables, whole grains, good sources of protein such as meat, eggs, or tofu, and low-fat dairy. Your health care provider will help you determine the amount of weight gain that is right for you. ?Avoid raw meat and uncooked cheese. These carry germs that can cause birth defects in the baby. ?If you have low calcium intake from food, talk to your health care provider about whether you should take a daily calcium supplement. ?Limit foods that are high in fat and processed sugars, such as fried and sweet foods. ?To prevent constipation: ?Drink enough fluid to keep your urine clear or pale yellow. ?Eat foods that are high in fiber, such as fresh fruits and vegetables, whole grains, and beans. ?Activity ?Exercise only as directed by your health care provider. Most women can continue their usual exercise routine during pregnancy. Try to exercise for 30 minutes at least 5 days a week. Stop exercising if you experience uterine contractions. ?Avoid heavy lifting, wear low heel shoes, and practice good posture. ?A sexual relationship may be continued unless your health care provider directs you otherwise. ?Relieving pain and discomfort ?Wear a good support bra to prevent discomfort from breast tenderness. ?Take  warm sitz baths to soothe any pain or discomfort caused by hemorrhoids. Use hemorrhoid cream if your health care provider approves. ?Rest with your legs elevated if you have leg cramps or low back pain. ?If you develop varicose veins, wear support hose. Elevate your feet for 15 minutes, 3-4 times a day. Limit salt in your diet. ?Prenatal Care ?Write down your questions. Take them to your prenatal visits. ?Keep all your prenatal visits as told by your health care provider. This is important. ?Safety ?Wear your seat belt at all times when driving. ?Make a list of emergency phone numbers, including numbers for family, friends, the hospital, and police and fire departments. ?General instructions ?Ask your health care provider for a referral to a local prenatal education class. Begin classes no later than the beginning of month 6 of your pregnancy. ?Ask for help if you have counseling or nutritional needs during pregnancy. Your health care provider can offer advice or refer you to specialists for help with various needs. ?Do not use hot tubs, steam rooms, or saunas. ?Do not douche or use tampons or scented sanitary pads. ?Do not cross your legs for long periods of time. ?Avoid cat litter boxes and soil used by cats. These carry germs that can cause birth defects in the baby  and possibly loss of the fetus by miscarriage or stillbirth. ?Avoid all smoking, herbs, alcohol, and unprescribed drugs. Chemicals in these products can affect the formation and growth of the baby. ?Do not use any products that contain nicotine or tobacco, such as cigarettes and e-cigarettes. If you need help quitting, ask your health care provider. ?Visit your dentist if you have not gone yet during your pregnancy. Use a soft toothbrush to brush your teeth and be gentle when you floss. ?Contact a health care provider if: ?You have dizziness. ?You have mild pelvic cramps, pelvic pressure, or nagging pain in the abdominal area. ?You have persistent  nausea, vomiting, or diarrhea. ?You have a bad smelling vaginal discharge. ?You have pain when you urinate. ?Get help right away if: ?You have a fever. ?You are leaking fluid from your vagina. ?You have spotting or b

## 2021-04-12 LAB — AFP TETRA
DIA Mom Value: 0.77
DIA Value (EIA): 86.34 pg/mL
DSR (By Age)    1 IN: 673
DSR (Second Trimester) 1 IN: 10000
Gestational Age: 16.5 WEEKS
MSAFP Mom: 1.23
MSAFP: 35.8 ng/mL
MSHCG Mom: 1.68
MSHCG: 44689 m[IU]/mL
Maternal Age At EDD: 30.3 yr
Osb Risk: 10000
T18 (By Age): 1:2623 {titer}
Test Results:: NEGATIVE
Weight: 262 [lb_av]
uE3 Mom: 1.64
uE3 Value: 1.49 ng/mL

## 2021-05-04 ENCOUNTER — Ambulatory Visit (INDEPENDENT_AMBULATORY_CARE_PROVIDER_SITE_OTHER): Payer: Commercial Managed Care - PPO

## 2021-05-04 ENCOUNTER — Encounter: Payer: Self-pay | Admitting: Obstetrics & Gynecology

## 2021-05-04 ENCOUNTER — Encounter: Payer: Commercial Managed Care - PPO | Admitting: Obstetrics & Gynecology

## 2021-05-04 ENCOUNTER — Ambulatory Visit (INDEPENDENT_AMBULATORY_CARE_PROVIDER_SITE_OTHER): Payer: Commercial Managed Care - PPO | Admitting: Obstetrics & Gynecology

## 2021-05-04 VITALS — BP 127/85 | HR 75 | Wt 265.0 lb

## 2021-05-04 DIAGNOSIS — Z363 Encounter for antenatal screening for malformations: Secondary | ICD-10-CM | POA: Diagnosis not present

## 2021-05-04 DIAGNOSIS — Z3A20 20 weeks gestation of pregnancy: Secondary | ICD-10-CM | POA: Diagnosis not present

## 2021-05-04 DIAGNOSIS — F411 Generalized anxiety disorder: Secondary | ICD-10-CM

## 2021-05-04 DIAGNOSIS — Z3480 Encounter for supervision of other normal pregnancy, unspecified trimester: Secondary | ICD-10-CM

## 2021-05-04 DIAGNOSIS — Z3482 Encounter for supervision of other normal pregnancy, second trimester: Secondary | ICD-10-CM

## 2021-05-04 DIAGNOSIS — I4891 Unspecified atrial fibrillation: Secondary | ICD-10-CM

## 2021-05-04 NOTE — Progress Notes (Signed)
[place] 20+1 wks,breech,posterior placenta gr 0,normal ovaries,CX 3.5 cm,SVP of fluid 6.3 cm,FHR 150 bpm,EFW 394 g,anatomy complete,no obvious abnormalities  ?

## 2021-05-04 NOTE — Progress Notes (Signed)
? ?LOW-RISK PREGNANCY VISIT ?Patient name: Kristi West MRN [id number]  Date of birth: 09-10-1990 ?Chief Complaint:   ?Routine Prenatal Visit and Pregnancy Ultrasound ? ?History of Present Illness:   ?Kristi West is a 31 y.o. [phone] female at [redacted]w[redacted]d with an Estimated Date of Delivery: 09/20/21 being seen today for ongoing management of a low-risk pregnancy.  ? ?Preg c/b: ? ?1)h/o a.fib in prior pregnancy- she has not yet seen cardiology- struggling to re-establish care with provider in [place] ? ?2) anxiety/depression ?-no meds and would prefer to avoid ?-interested in re-starting therapy ? ? ?  03/09/2021  ?  9:57 AM 11/18/2017  ?  5:41 PM 06/24/2017  ?  4:29 PM 07/16/2016  ?  5:54 PM 07/02/2016  ?  6:22 PM  ?Depression screen PHQ 2/9  ?Decreased Interest 1 0 0 0 0  ?Down, Depressed, Hopeless 1 0 0 0 0  ?PHQ - 2 Score 2 0 0 0 0  ?Altered sleeping 0      ?Tired, decreased energy 2      ?Change in appetite 1      ?Feeling bad or failure about yourself  1      ?Trouble concentrating 0      ?Moving slowly or fidgety/restless 0      ?Suicidal thoughts 0      ?PHQ-9 Score 6      ? ? ?Today she reports  pelvic pressure . Contractions: Not present. Vag. Bleeding: None.  Movement: Present. denies leaking of fluid. ?Review of Systems:   ?Pertinent items are noted in HPI ?Denies abnormal vaginal discharge w/ itching/odor/irritation, headaches, visual changes, shortness of breath, chest pain, abdominal pain, severe nausea/vomiting, or problems with urination or bowel movements unless otherwise stated above. ?Pertinent History Reviewed:  ?Reviewed past medical,surgical, social, obstetrical and family history.  ?Reviewed problem list, medications and allergies. ? ?Physical Assessment:  ? ?Vitals:  ? 05/04/21 1154  ?BP: 127/85  ?Pulse: 75  ?Weight: 265 lb (120.2 kg)  ?Body mass index is 41.5 kg/m?. ?  ?     Physical Examination:  ? General appearance: Well appearing, and in no distress ? Mental status: Alert, oriented to person,  place, and time ? Skin: Warm & dry ? Respiratory: Normal respiratory effort, no distress ? Abdomen: Soft, gravid, nontender ? Pelvic: Cervical exam deferred        ? Extremities: Edema: None ? Psych:  mood and affect appropriate ? ?Fetal Status:     Movement: Present   ? ?ANATOMY SCAN: breech,posterior placenta gr 0,normal ovaries,CX 3.5 cm,SVP of fluid 6.3 cm,FHR 150 bpm,EFW 394 g,anatomy complete,no obvious abnormalities  ? ?Chaperone: n/a   ? ?No results found for this or any previous visit (from the past 24 hour(s)).  ? ?Assessment & Plan:  ?1) Low-risk pregnancy [id number] at [redacted]w[redacted]d with an Estimated Date of Delivery: 09/20/21  ? ?2) h/o a.fib- referral created for Dr. Servando Salina ?  ?Meds: No orders of the defined types were placed in this encounter. ? ?Labs/procedures today: anatomy scan ? ?Plan:  Continue routine obstetrical care  ?Next visit: prefers in person   ? ?Reviewed: Preterm labor symptoms and general obstetric precautions including but not limited to vaginal bleeding, contractions, leaking of fluid and fetal movement were reviewed in detail with the patient.  All questions were answered. Pt has home bp cuff. Check bp weekly, let us know if >140/90.  ? ?Follow-up: Return in about 4 weeks (around 06/01/2021) for LROB visit. ? ?No orders of  the defined types were placed in this encounter. ? ? ?Myna Hidalgo, DO ?Attending Obstetrician & Gynecologist, Faculty Practice ?Center for [employer], [facility] ? ? ? ?

## 2021-05-04 NOTE — Addendum Note (Signed)
Addended by: Moss Mc on: 05/04/2021 02:05 PM ? ? Modules accepted: Orders ? ?

## 2021-05-05 ENCOUNTER — Ambulatory Visit (INDEPENDENT_AMBULATORY_CARE_PROVIDER_SITE_OTHER): Payer: Commercial Managed Care - PPO | Admitting: Cardiology

## 2021-05-05 ENCOUNTER — Ambulatory Visit (INDEPENDENT_AMBULATORY_CARE_PROVIDER_SITE_OTHER): Payer: Commercial Managed Care - PPO

## 2021-05-05 ENCOUNTER — Encounter: Payer: Self-pay | Admitting: Cardiology

## 2021-05-05 VITALS — BP 124/76 | HR 75 | Ht 67.0 in | Wt 268.0 lb

## 2021-05-05 DIAGNOSIS — I480 Paroxysmal atrial fibrillation: Secondary | ICD-10-CM

## 2021-05-05 DIAGNOSIS — Z7689 Persons encountering health services in other specified circumstances: Secondary | ICD-10-CM | POA: Diagnosis not present

## 2021-05-05 NOTE — Progress Notes (Signed)
?Cardio-Obstetrics Clinic ? ?New Evaluation ? ?Date:  05/05/2021  ? ?ID:  Kristi West, DOB 1990/06/02, MRN [id number] ? ?PCP:  Junie Spencer, FNP ?  ?[facility] Providers ?Cardiologist:  Thomasene Ripple, DO  ?Electrophysiologist:  None      ? ?Referring MD: Junie Spencer, FNP  ? ?Chief Complaint: " I am a bit nervous" ? ?History of Present Illness:   ? ?Kristi West is a 31 y.o. female [G4P2012] who is being seen today for the evaluation of palpitations at the request of Junie Spencer, FNP.  ? ?She is 20 weeks into this pregnancy with her second child, and that she has a history of paroxysmal atrial fibrillation which was diagnosed in 2017 during her first pregnancy.  At that time she was treated at [facility].  She had an echocardiogram which was normal.  Postpartum she really did not follow-up with cardiology. ?In 2019 the patient tells me that she had an episode where she was seen at [facility] when she was in atrial fibrillation.  Since that time she has not had any recurrences. ? ?She is here today because she is experiencing intermittent palpitations.  She is worried he may want to make sure that she is not going to A-fib. ? ?Prior CV Studies Reviewed: ?The following studies were reviewed today: ?Echocardiogram August 25, 2015 ?Interpretation Summary  ?A complete portable two-dimensional transthoracic echocardiogram with color  ?flow Doppler and Spectral Doppler was performed. The left ventricle is  ?grossly normal size.  ?There is mild concentric left ventricular hypertrophy.  ?The left ventricular ejection fraction is normal (60-65%).  ?The left ventricular wall motion is normal.  ?Right ventricular systolic pressure is normal.  ?The aortic valve is trileaflet.  ?Unable to adequately determine diastolic dysfunction.  ? ?Left Ventricle  ?The left ventricle is grossly normal size. There is mild concentric left  ?ventricular hypertrophy. The left ventricular ejection fraction is normal  ?(60-65%).  The left ventricular wall motion is normal. Unable to adequately  ?determine diastolic dysfunction.  ? ? ?Right Ventricle  ?The right ventricle is grossly normal in size and function.  ? ?Atria  ?The left and right atria are normal size.  ? ?Mitral Valve  ?The mitral valve is grossly normal. There is no mitral valve stenosis.  ?There is trace mitral regurgitation.  ? ? ?Tricuspid Valve  ?The tricuspid valve is grossly normal. There is trace tricuspid  ?regurgitation. Right ventricular systolic pressure is normal.  ? ?Aortic Valve  ?The aortic valve is trileaflet. The aortic valve opens well. There is no  ?aortic stenosis. There is no aortic regurgitation present.  ? ?Pulmonic Valve  ?The pulmonic valve is not well seen, but is grossly normal. There is trace  ?pulmonic regurgitation.  ? ?Vessels  ?The aortic root is normal in diameter.  ? ?Pericardium  ?There is no pericardial effusion.   ? ?Past Medical History:  ?Diagnosis Date  ? A-fib (HCC)   ? Abnormal Pap smear   ? Albinism (HCC)   ? Anemia   ? Anxiety   ? Atrial fibrillation, unspecified   ? Heart murmur   ? Pregnant 07/07/2012  ? Seizures (HCC)   ? x1 as a child  ? Vaginal Pap smear, abnormal   ? ? ?Past Surgical History:  ?Procedure Laterality Date  ? TONSILLECTOMY    ? WISDOM TOOTH EXTRACTION    ?   ? ?OB History   ? ? Gravida  ?4  ? Para  ?2  ?  Term  ?2  ? Preterm  ?   ? AB  ?1  ? Living  ?2  ?  ? ? SAB  ?   ? IAB  ?   ? Ectopic  ?   ? Multiple  ?0  ? Live Births  ?2  ?   ?  ?  ?    ? ? ?Current Medications: ?Current Meds  ?Medication Sig  ? aspirin 81 MG EC tablet Take 2 tablets (162 mg total) by mouth daily. Swallow whole.  ? flecainide (TAMBOCOR) 100 MG tablet Take 1 orally if atrial fibrillation persists.  Repeat in 1 hour if persists.  May repeat 1 more time in 4 hours if needed.  If persists beyond this, visit Emergency Department  ? Prenatal Vit-Fe Fumarate-FA (MULTIVITAMIN-PRENATAL) 27-0.8 MG TABS tablet Take 1 tablet by mouth daily at 12 noon.  ?   ? ?Allergies:   Ceclor [cefaclor], Other, and Peanut-containing drug products  ? ?Social History  ? ?Socioeconomic History  ? Marital status: Unknown  ?  Spouse name: Not on file  ? Number of children: 2  ? Years of education: Not on file  ? Highest education level: Not on file  ?Occupational History  ? Not on file  ?Tobacco Use  ? Smoking status: Former  ?  Years: 0.50  ?  Types: Cigarettes  ? Smokeless tobacco: Never  ?Vaping Use  ? Vaping Use: Never used  ?Substance and Sexual Activity  ? Alcohol use: Not Currently  ?  Comment: occa  ? Drug use: No  ? Sexual activity: Yes  ?  Birth control/protection: Condom  ?Other Topics Concern  ? Not on file  ?Social History Narrative  ? Not on file  ? ?Social Determinants of Health  ? ?Financial Resource Strain: Medium Risk  ? Difficulty of Paying Living Expenses: Somewhat hard  ?Food Insecurity: No Food Insecurity  ? Worried About Programme researcher, broadcasting/film/video in the Last Year: Never true  ? Ran Out of Food in the Last Year: Never true  ?Transportation Needs: No Transportation Needs  ? Lack of Transportation (Medical): No  ? Lack of Transportation (Non-Medical): No  ?Physical Activity: Insufficiently Active  ? Days of Exercise per Week: 1 day  ? Minutes of Exercise per Session: 10 min  ?Stress: Stress Concern Present  ? Feeling of Stress : To some extent  ?Social Connections: Socially Integrated  ? Frequency of Communication with Friends and Family: More than three times a week  ? Frequency of Social Gatherings with Friends and Family: Once a week  ? Attends Religious Services: More than 4 times per year  ? Active Member of Clubs or Organizations: Yes  ? Attends Banker Meetings: More than 4 times per year  ? Marital Status: Married  ?  ? ? ?Family History  ?Problem Relation Age of Onset  ? SIDS Brother   ? Cancer Maternal Aunt   ?     stomach  ? Cancer Maternal Grandmother   ?     leukemia  ? Diabetes Paternal Grandmother   ? Stroke Paternal Grandfather   ?   ? ?ROS:    ?Review of Systems  ?Constitution: Negative for decreased appetite, fever and weight gain.  ?HENT: Negative for congestion, ear discharge, hoarse voice and sore throat.   ?Eyes: Negative for discharge, redness, vision loss in right eye and visual halos.  ?Cardiovascular: Reports palpitations. Negative for chest pain, dyspnea on exertion, leg swelling, orthopnea. ?Respiratory: Negative  for cough, hemoptysis, shortness of breath and snoring.   ?Endocrine: Negative for heat intolerance and polyphagia.  ?Hematologic/Lymphatic: Negative for bleeding problem. Does not bruise/bleed easily.  ?Skin: Negative for flushing, nail changes, rash and suspicious lesions.  ?Musculoskeletal: Negative for arthritis, joint pain, muscle cramps, myalgias, neck pain and stiffness.  ?Gastrointestinal: Negative for abdominal pain, bowel incontinence, diarrhea and excessive appetite.  ?Genitourinary: Negative for decreased libido, genital sores and incomplete emptying.  ?Neurological: Negative for brief paralysis, focal weakness, headaches and loss of balance.  ?Psychiatric/Behavioral: Negative for altered mental status, depression and suicidal ideas.  ?Allergic/Immunologic: Negative for HIV exposure and persistent infections.  ? ? ? ?Labs/EKG Reviewed:   ? ?EKG:   ?EKG is  ordered today.  The ekg ordered today demonstrates sinus rhythm ? ?Recent Labs: ?03/09/2021: Hemoglobin 13.2; Platelets 221  ? ?Recent Lipid Panel ?Lab Results  ?Component Value Date/Time  ? CHOL 180 12/03/2014 03:34 PM  ? TRIG 99 12/03/2014 03:34 PM  ? HDL 48 12/03/2014 03:34 PM  ? CHOLHDL 3.8 12/03/2014 03:34 PM  ? LDLCALC 112 (H) 12/03/2014 03:34 PM  ? ? ?Physical Exam:   ? ?VS:  BP 124/76 (BP Location: Right Arm, Patient Position: Sitting, Cuff Size: Large)   Pulse 75   Ht 5\' 7"  (1.702 m)   Wt 268 lb (121.6 kg)   LMP 12/14/2020   SpO2 98%   BMI 41.97 kg/m?    ? ?Wt Readings from Last 3 Encounters:  ?05/05/21 268 lb (121.6 kg)  ?05/04/21 265 lb (120.2 kg)   ?04/10/21 262 lb (118.8 kg)  ?  ? ?GEN:  Well nourished, well developed in no acute distress ?HEENT: Normal ?NECK: No JVD; No carotid bruits ?LYMPHATICS: No lymphadenopathy ?CARDIAC: RRR, no murmurs, rubs, gallops

## 2021-05-05 NOTE — Progress Notes (Unsigned)
Enrolled for [employer] to mail a ZIO XT long term holter monitor to the patients address on file.  

## 2021-05-05 NOTE — Patient Instructions (Addendum)
Medication Instructions:  ?Your physician recommends that you continue on your current medications as directed. Please refer to the Current Medication list given to you today.  ?*If you need a refill on your cardiac medications before your next appointment, please call your pharmacy* ? ? ?Lab Work: ?None ?If you have labs (blood work) drawn today and your tests are completely normal, you will receive your results only by: ?MyChart Message (if you have MyChart) OR ?A paper copy in the mail ?If you have any lab test that is abnormal or we need to change your treatment, we will call you to review the results. ? ? ?Testing/Procedures: ?ZIO XT- Long Term Monitor Instructions ? ?Your physician has requested you wear a ZIO patch monitor for 14 days.  ?This is a single patch monitor. [employer] supplies one patch monitor per enrollment. Additional ?stickers are not available. Please do not apply patch if you will be having a Nuclear Stress Test,  ?Echocardiogram, Cardiac CT, MRI, or Chest Xray during the period you would be wearing the  ?monitor. The patch cannot be worn during these tests. You cannot remove and re-apply the  ?ZIO XT patch monitor.  ?Your ZIO patch monitor will be mailed 3 day USPS to your address on file. It may take 3-5 days  ?to receive your monitor after you have been enrolled.  ?Once you have received your monitor, please review the enclosed instructions. Your monitor  ?has already been registered assigning a specific monitor serial # to you. ? ?Billing and Patient Assistance Program Information ? ?We have supplied [employer] with any of your insurance information on file for billing purposes. ?[employer] offers a sliding scale Patient Assistance Program for patients that do not have  ?insurance, or whose insurance does not completely cover the cost of the ZIO monitor.  ?You must apply for the Patient Assistance Program to qualify for this discounted rate.  ?To apply, please call [employer] at [phone], select  option 4, select option 2, ask to apply for  ?Patient Assistance Program. [employer] will ask your household income, and how many people  ?are in your household. They will quote your out-of-pocket cost based on that information.  ?[employer] will also be able to set up a 12-month, interest-free payment plan if needed. ? ?Applying the monitor ?  ?Shave hair from upper left chest.  ?Hold abrader disc by orange tab. Rub abrader in 40 strokes over the upper left chest as  ?indicated in your monitor instructions.  ?Clean area with 4 enclosed alcohol pads. Let dry.  ?Apply patch as indicated in monitor instructions. Patch will be placed under collarbone on left  ?side of chest with arrow pointing upward.  ?Rub patch adhesive wings for 2 minutes. Remove white label marked "1". Remove the white  ?label marked "2". Rub patch adhesive wings for 2 additional minutes.  ?While looking in a mirror, press and release button in center of patch. A small green light will  ?flash 3-4 times. This will be your only indicator that the monitor has been turned on.  ?Do not shower for the first 24 hours. You may shower after the first 24 hours.  ?Press the button if you feel a symptom. You will hear a small click. Record Date, Time and  ?Symptom in the Patient Logbook.  ?When you are ready to remove the patch, follow instructions on the last 2 pages of Patient  ?Logbook. Stick patch monitor onto the last page of Patient Logbook.  ?Place Patient Logbook in   the blue and white box. Use locking tab on box and tape box closed  ?securely. The blue and white box has prepaid postage on it. Please place it in the mailbox as  ?soon as possible. Your physician should have your test results approximately 7 days after the  ?monitor has been mailed back to [facility].  ?Call [facility] at [phone] if you have questions regarding  ?your ZIO XT patch monitor. Call them immediately if you see an orange light blinking on your  ?monitor.   ?If your monitor falls off in less than 4 days, contact our Monitor department at [phone].  ?If your monitor becomes loose or falls off after 4 days call [employer] at [phone] for  ?suggestions on securing your monitor.  ? ? ?Follow-Up: ?At [facility], you and your health needs are our priority.  As part of our continuing mission to provide you with exceptional heart care, we have created designated Provider Care Teams.  These Care Teams include your primary Cardiologist (physician) and Advanced Practice Providers (APPs -  Physician Assistants and Nurse Practitioners) who all work together to provide you with the care you need, when you need it. ? ?We recommend signing up for the patient portal called "MyChart".  Sign up information is provided on this After Visit Summary.  MyChart is used to connect with patients for Virtual Visits (Telemedicine).  Patients are able to view lab/test results, encounter notes, upcoming appointments, etc.  Non-urgent messages can be sent to your provider as well.   ?To learn more about what you can do with MyChart, go to [email or web address].   ? ?Your next appointment:   ?12 week(s) ? ?The format for your next appointment:   ?In Person ? ?Provider:   ?Berniece Salines  ?[facility] ?[address] [place], [place] [address] ? ?Important Information About [employer] ? ? ? ? ?  ?

## 2021-05-08 DIAGNOSIS — I480 Paroxysmal atrial fibrillation: Secondary | ICD-10-CM

## 2021-05-09 NOTE — BH Specialist Note (Signed)
Integrated Behavioral Health via Telemedicine Visit ? ?05/22/2021 ?[name] ?[id number] ? ?Number of Integrated Behavioral Health Clinician visits: 1- Initial Visit ? ?Session Start time: [phone] ?  ?Session End time: 0935 ? ?Total time in minutes: 49 ? ? ?Referring Provider: Myna Hidalgo, MD ?Patient/Family location: Home ?[facility] Provider location: [facility] [employer] [facility] ? ?All persons participating in visit: Patient Kristi West and [facility] Ayan Yankey  ? ?Types of Service: Individual psychotherapy and Video visit ? ?I connected with Noel Gerold and/or Zulema L Silverstein's  n/a  via  Telephone or Video Enabled Telemedicine Application  (Video is Caregility application) and verified that I am speaking with the correct person using two identifiers. Discussed confidentiality: Yes  ? ?I discussed the limitations of telemedicine and the availability of in person appointments.  Discussed there is a possibility of technology failure and discussed alternative modes of communication if that failure occurs. ? ?I discussed that engaging in this telemedicine visit, they consent to the provision of behavioral healthcare and the services will be billed under their insurance. ? ?Patient and/or legal guardian expressed understanding and consented to Telemedicine visit: Yes  ? ?Presenting Concerns: ?Patient and/or family reports the following symptoms/concerns: Increased anxiety and worry in current pregnancy, attributed to health risks and life stress; open to implementing self-coping strategy today. ?Duration of problem: Increase in current pregnancy; Severity of problem: moderate ? ?Patient and/or Family's Strengths/Protective Factors: ?Social connections, Concrete supports in place (healthy food, safe environments, etc.), and Sense of purpose ? ?Goals Addressed: ?Patient will: ? Reduce symptoms of: anxiety, depression, and stress  ? Increase knowledge and/or ability of: coping skills and  healthy habits  ? Demonstrate ability to: Increase healthy adjustment to current life circumstances ? ?Progress towards Goals: ?Ongoing ? ?Interventions: ?Interventions utilized:  Copywriter, advertising and Psychoeducation and/or Health Education ?Standardized Assessments completed:  not given today ? ?Patient and/or Family Response: Patient agrees with treatment plan. ? ? ?Assessment: ?Patient currently experiencing Generalized anxiety disorder ? ?Patient may benefit from psychoeducation and brief therapeutic interventions regarding coping with symptoms of anxiety, depression, life stress ?. ? ?Plan: ?Follow up with behavioral health clinician on : Two weeks ?Behavioral recommendations:  ?-Continue taking prenatal vitamin daily ?-Begin Worry Time strategy, as discussed. Start by setting up start and end time reminders on phone today; continue daily for two weeks. ?-Continue using box breathing exercise as needed daily ?Referral(s): Integrated [employer] (In Clinic) ? ?I discussed the assessment and treatment plan with the patient and/or parent/guardian. They were provided an opportunity to ask questions and all were answered. They agreed with the plan and demonstrated an understanding of the instructions. ?  ?They were advised to call back or seek an in-person evaluation if the symptoms worsen or if the condition fails to improve as anticipated. ? ?Rae Lips, LCSW ? ? ?  03/09/2021  ?  9:57 AM 11/18/2017  ?  5:41 PM 06/24/2017  ?  4:29 PM 07/16/2016  ?  5:54 PM 07/02/2016  ?  6:22 PM  ?Depression screen PHQ 2/9  ?Decreased Interest 1 0 0 0 0  ?Down, Depressed, Hopeless 1 0 0 0 0  ?PHQ - 2 Score 2 0 0 0 0  ?Altered sleeping 0      ?Tired, decreased energy 2      ?Change in appetite 1      ?Feeling bad or failure about yourself  1      ?  Trouble concentrating 0      ?Moving slowly or fidgety/restless 0      ?Suicidal thoughts 0      ?PHQ-9 Score 6      ? ? ?  03/09/2021  ?  9:57 AM  ?GAD 7  : Generalized Anxiety Score  ?Nervous, Anxious, on Edge 2  ?Control/stop worrying 1  ?Worry too much - different things 1  ?Trouble relaxing 1  ?Restless 0  ?Easily annoyed or irritable 1  ?Afraid - awful might happen 2  ?Total GAD 7 Score 8  ? ? ? ? ?

## 2021-05-22 ENCOUNTER — Encounter: Payer: Self-pay | Admitting: Women's Health

## 2021-05-22 ENCOUNTER — Ambulatory Visit (INDEPENDENT_AMBULATORY_CARE_PROVIDER_SITE_OTHER): Payer: Commercial Managed Care - PPO | Admitting: Clinical

## 2021-05-22 DIAGNOSIS — F411 Generalized anxiety disorder: Secondary | ICD-10-CM | POA: Diagnosis not present

## 2021-05-22 NOTE — BH Specialist Note (Signed)
Integrated Behavioral Health via Telemedicine Visit ? ?05/22/2021 ?Kristi West ?[id number] ? ?Number of Integrated Behavioral Health Clinician visits: 1- Initial Visit ? ?Session Start time: [phone] ?  ?Session End time: 0935 ? ?Total time in minutes: 49 ? ? ?Referring Provider: Myna Hidalgo, DO ?Patient/Family location: Work/[place] ?[facility] Provider location: [facility] [employer] [facility] ? ?All persons participating in visit: Patient Kristi West and [facility] Ms Tylerjames Hoglund  ? ?Types of Service: Individual psychotherapy and Video visit ? ?I connected with Noel Gerold and/or Azyria L Villella's  n/a  via  Telephone or Video Enabled Telemedicine Application  (Video is Caregility application) and verified that I am speaking with the correct person using two identifiers. Discussed confidentiality: Yes  ? ?I discussed the limitations of telemedicine and the availability of in person appointments.  Discussed there is a possibility of technology failure and discussed alternative modes of communication if that failure occurs. ? ?I discussed that engaging in this telemedicine visit, they consent to the provision of behavioral healthcare and the services will be billed under their insurance. ? ?Patient and/or legal guardian expressed understanding and consented to Telemedicine visit: Yes  ? ?Presenting Concerns: ?Patient and/or family reports the following symptoms/concerns: Unable to access AVS via MyChart; utilizing self-coping strategies effectively to manage anxiety; no other concerns at this time. ?Duration of problem: Increase pregnancy; Severity of problem: moderate ? ?Patient and/or Family's Strengths/Protective Factors: ?Social connections, Concrete supports in place (healthy food, safe environments, etc.), Sense of purpose, and Physical Health (exercise, healthy diet, medication compliance, etc.) ? ?Goals Addressed: ?Patient will: ? Reduce symptoms of: anxiety, depression, and stress  ? Increase  knowledge and/or ability of: coping skills and healthy habits  ? Demonstrate ability to: Increase healthy adjustment to current life circumstances ? ?Progress towards Goals: ?Ongoing ? ?Interventions: ?Interventions utilized:  Supportive Reflection ?Standardized Assessments completed: GAD-7 and PHQ 9 ? ?Patient and/or Family Response: Patient agrees with treatment plan. ? ? ?Assessment: ?Patient currently experiencing Generalized anxiety disorder.  ? ?Patient may benefit from continued therapeutic intervention today. ? ?Plan: ?Follow up with behavioral health clinician on : Call Vilda Zollner at [phone], as needed. ?Behavioral recommendations:  ?-Continue taking prenatal vitamin daily as recommended by medical provider ?-Continue utilizing daily self-coping strategies as needed (box breathing, worry time, etc.) ?-Contact MyChart Help Desk as needed ?-Read through Postpartum Planner on After Visit Summary; use as needed ?Referral(s): Integrated [employer] (In Clinic) ? ?I discussed the assessment and treatment plan with the patient and/or parent/guardian. They were provided an opportunity to ask questions and all were answered. They agreed with the plan and demonstrated an understanding of the instructions. ?  ?They were advised to call back or seek an in-person evaluation if the symptoms worsen or if the condition fails to improve as anticipated. ? ?Rae Lips, LCSW ? ? ?  06/05/2021  ?  8:50 AM 03/09/2021  ?  9:57 AM 11/18/2017  ?  5:41 PM 06/24/2017  ?  4:29 PM 07/16/2016  ?  5:54 PM  ?Depression screen PHQ 2/9  ?Decreased Interest 1 1 0 0 0  ?Down, Depressed, Hopeless 1 1 0 0 0  ?PHQ - 2 Score 2 2 0 0 0  ?Altered sleeping 2 0     ?Tired, decreased energy 2 2     ?Change in appetite 0 1     ?Feeling bad or failure about yourself  0 1     ?Trouble concentrating 0 0     ?  Moving slowly or fidgety/restless 0 0     ?Suicidal thoughts 0 0     ?PHQ-9 Score 6 6     ? ? ?  06/05/2021  ?  8:54 AM 03/09/2021  ?   9:57 AM  ?GAD 7 : Generalized Anxiety Score  ?Nervous, Anxious, on Edge 2 2  ?Control/stop worrying 1 1  ?Worry too much - different things 1 1  ?Trouble relaxing 0 1  ?Restless 0 0  ?Easily annoyed or irritable 1 1  ?Afraid - awful might happen 1 2  ?Total GAD 7 Score 6 8  ? ? ? ?

## 2021-05-22 NOTE — Patient Instructions (Signed)
[facility] ?[address] ?[place], [place] [address] ?[phone] (main office) ?[phone] (Rojelio Uhrich's office) ? ?[email or web address] ? ? ? ? ?BRAINSTORMING ? ?Develop a Plan ?Goals: ?Provide a way to start conversation about your new life with a baby ?Assist parents in recognizing and using resources within their reach ?Help pave the way before birth for an easier period of transition afterwards. ? ?Make a list of the following information to keep in a central location: ?Full name of Mom and Partner: _____________________________________________ ?Baby's full name and Date of Birth: ___________________________________________ ?Home Address: ___________________________________________________________ ?________________________________________________________________________ ?Home Phone: ____________________________________________________________ ?Parents' cell numbers: _____________________________________________________ ?________________________________________________________________________ ?Name and contact info for OB: ______________________________________________ ?Name and contact info for Pediatrician:________________________________________ ?Contact info for Lactation Consultants: ________________________________________ ? ?REST and SLEEP ?*You each need at least 4-5 hours of uninterrupted sleep every day. Write specific names and contact information.* ?How are you going to rest in the postpartum period? While partner's home? When partner returns to work? When you both return to work? ?Where will your baby sleep? ?Who is available to help during the day? Evening? Night? ?Who could move in for a period to help support you? ?What are some ideas to help you get enough  sleep? ?__________________________________________________________________________________________________________________________________________________________________________________________________________________________________________ ?NUTRITIOUS FOOD AND DRINK ?*Plan for meals before your baby is born so you can have healthy food to eat during the immediate postpartum period.* ?Who will look after breakfast? Lunch? Dinner? List names and contact information. Brainstorm quick, healthy ideas for each meal. ?What can you do before baby is born to prepare meals for the postpartum period? ?How can others help you with meals? ?Which grocery stores provide online shopping and delivery? ?Which restaurants offer take-out or delivery options? ?______________________________________________________________________________________________________________________________________________________________________________________________________________________________________________________________________________________________________________________________________________________________________________________________________ ? ?CARE FOR MOM ?*It's important that mom is cared for and pampered in the postpartum period. Remember, the most important ways new mothers need care are: sleep, nutrition, gentle exercise, and time off.* ?Who can come take care of mom during this period? Make a list of people with their contact information. ?List some activities that make you feel cared for, rested, and energized? Who can make sure you have opportunities to do these things? ?Does mom have a space of her very own within your home that's just for her? Make a ?Mama Cave? where she can be comfortable, rest, and renew herself  daily. ?______________________________________________________________________________________________________________________________________________________________________________________________________________________________________________________________________________________________________________________________________________________________________________________________________ ? ? ? ?CARE FOR AND FEEDING BABY ?*Knowledgeable and encouraging people will offer the best support with regard to feeding your baby.* ?Educate yourself and choose the best feeding option for your baby. ?Make a list of people who will guide, support, and be a resource for you as your care for and feed your baby. (Friends that have breastfed or are currently breastfeeding, lactation consultants, breastfeeding support groups, etc.) ?Consider a postpartum doula. (These websites can give you information: dona.org & padanc.org) ?Seek out local breastfeeding resources like the breastfeeding support group at [facility] or [employer]. ?______________________________________________________________________________________________________________________________________________________________________________________________________________________________________________________________________________________________________________________________________________________________________________________________________ ? ?CHORES AND ERRANDS ?Who can help with a thorough cleaning before baby is born? ?Make a list of people who will help with housekeeping and chores, like laundry, light cleaning, dishes, bathrooms, etc. ?Who can run some errands for you? ?What can you do to make sure you are stocked with basic supplies before baby is born? ?Who is going to do the  shopping? ?______________________________________________________________________________________________________________________________________________________________________________________________________________________________________________________________________________________________________________________________________________________________________________________________________ ? ? ? ? ?Family Adjustment ?*Nurture yourselves?it helps parents be more loving and allows for better bonding with their child.* ?What sorts of things do you and   partner enjoy doing together? Which activities help you to connect and strengthen your relationship? Make a list of those things. Make a list of people whom you trust to care for your baby so you can have some time together as a couple. ?What types of things help partner feel connected to Mom? Make a list. ?What needs will partner have in order to bond with baby? ?Other children? Who will care for them when you go into labor and while you are in the hospital? ?Think about what the needs of your older children might be. Who can help you meet those needs? In what ways are you helping them prepare for bringing baby home? List some specific strategies you have for family adjustment. ?_______________________________________________________________________________________________________________________________________________________________________________________________________________________________________________________________________________________________________________________________________________ ? ?SUPPORT ?*Someone who can empathize with experiences normalizes your problems and makes them more bearable.* ?Make a list of other friends, neighbors, and/or co-workers you know with infants (and small children, if applicable) with whom you can connect. ?Make a list of local or online support groups, mom groups, etc. in which you can be  involved. ?______________________________________________________________________________________________________________________________________________________________________________________________________________________________________________________________________________________________________________________________________________________________________________________________________ ? ?Childcare Plans ?Investigate and plan for childcare if mom is returning to work. ?Talk about mom's concerns about her transition back to work. ?Talk about partner's concerns regarding this transition. ? ?Mental Health ?*Your mental health is one of the highest priorities for a pregnant or postpartum mom.* ?1 in 5 women experience anxiety and/or depression from the time of concepti

## 2021-06-01 ENCOUNTER — Ambulatory Visit (INDEPENDENT_AMBULATORY_CARE_PROVIDER_SITE_OTHER): Payer: Commercial Managed Care - PPO | Admitting: Obstetrics & Gynecology

## 2021-06-01 ENCOUNTER — Encounter: Payer: Self-pay | Admitting: Obstetrics & Gynecology

## 2021-06-01 VITALS — BP 120/81 | HR 78 | Wt 273.2 lb

## 2021-06-01 DIAGNOSIS — I4891 Unspecified atrial fibrillation: Secondary | ICD-10-CM

## 2021-06-01 DIAGNOSIS — Z3A24 24 weeks gestation of pregnancy: Secondary | ICD-10-CM

## 2021-06-01 DIAGNOSIS — F411 Generalized anxiety disorder: Secondary | ICD-10-CM

## 2021-06-01 DIAGNOSIS — Z3402 Encounter for supervision of normal first pregnancy, second trimester: Secondary | ICD-10-CM

## 2021-06-01 NOTE — Progress Notes (Signed)
? ?HIGH-RISK PREGNANCY VISIT ?Patient name: Kristi West MRN [id number]  Date of birth: July 15, 1990 ?Chief Complaint:   ?Routine Prenatal Visit ? ?History of Present Illness:   ?Kristi West is a 31 y.o. [phone] female at [redacted]w[redacted]d with an Estimated Date of Delivery: 09/20/21 being seen today for ongoing management of a high-risk pregnancy complicated by: ?1) h/o Afib ?S/p cards consult- may start on BB, f/u in July ?Pt currently asymptomatic   ? ?2) Anxiety no meds currently ? ?Today she reports pelvic/vaginal pressure ? ?Contractions: Not present. Vag. Bleeding: None.  Movement: Present. denies leaking of fluid.  ? ? ?  03/09/2021  ?  9:57 AM 11/18/2017  ?  5:41 PM 06/24/2017  ?  4:29 PM 07/16/2016  ?  5:54 PM 07/02/2016  ?  6:22 PM  ?Depression screen PHQ 2/9  ?Decreased Interest 1 0 0 0 0  ?Down, Depressed, Hopeless 1 0 0 0 0  ?PHQ - 2 Score 2 0 0 0 0  ?Altered sleeping 0      ?Tired, decreased energy 2      ?Change in appetite 1      ?Feeling bad or failure about yourself  1      ?Trouble concentrating 0      ?Moving slowly or fidgety/restless 0      ?Suicidal thoughts 0      ?PHQ-9 Score 6      ? ? ? ?Current Outpatient Medications  ?Medication Instructions  ? aspirin 162 mg, Oral, Daily, Swallow whole.  ? flecainide (TAMBOCOR) 100 MG tablet Take 1 orally if atrial fibrillation persists.  Repeat in 1 hour if persists.  May repeat 1 more time in 4 hours if needed.  If persists beyond this, visit Emergency Department  ? Prenatal Vit-Fe Fumarate-FA (MULTIVITAMIN-PRENATAL) 27-0.8 MG TABS tablet 1 tablet, Oral, Daily  ?  ? ?Review of Systems:   ?Pertinent items are noted in HPI ?Denies abnormal vaginal discharge w/ itching/odor/irritation, headaches, visual changes, shortness of breath, chest pain, abdominal pain, severe nausea/vomiting, or problems with urination or bowel movements unless otherwise stated above. ?Pertinent History Reviewed:  ?Reviewed past medical,surgical, social, obstetrical and family history.  ?Reviewed  problem list, medications and allergies. ?Physical Assessment:  ? ?Vitals:  ? 06/01/21 0841  ?BP: 120/81  ?Pulse: 78  ?Weight: 273 lb 3.2 oz (123.9 kg)  ?Body mass index is 42.79 kg/m?. ?     ?     Physical Examination:  ? General appearance: alert, well appearing, and in no distress ? Mental status: normal mood, behavior, speech, dress, motor activity, and thought processes ? Skin: warm & dry  ? Extremities: Edema: None  ?  Cardiovascular: normal heart rate noted ? Respiratory: normal respiratory effort, no distress ? Abdomen: gravid, soft, non-tender ? Pelvic: Cervical exam deferred        ? ?Fetal Status: Fetal Heart Rate (bpm): 145 Fundal Height: 25 cm Movement: Present   ? ?Fetal Surveillance Testing today: doppler  ? ?Chaperone: N/A   ? ?No results found for this or any previous visit (from the past 24 hour(s)).  ? ?Assessment & Plan:  ?High-risk pregnancy: Kristi West:[id number] at [redacted]w[redacted]d with an Estimated Date of Delivery: 09/20/21  ? ?1) h/o A.fib ?-currently flecainide prn, which she has not needed ?-followed by cardiology ? ?2) Contraception ?-discussed options, may want another pregnancy in the future ?-desires IUD, agreeable to possible postpartum placement ? ? ?Meds: No orders of the defined types were placed in this encounter. ? ? ?  Labs/procedures today: none ? ?Treatment Plan:  PN-2 next visit ? ?Reviewed: Preterm labor symptoms and general obstetric precautions including but not limited to vaginal bleeding, contractions, leaking of fluid and fetal movement were reviewed in detail with the patient.  All questions were answered. Pt has home bp cuff. Check bp weekly, let us know if >140/90.  ? ?Follow-up: Return in about 3 weeks (around 06/22/2021) for HROB visit and PN-2. ? ? ?Future Appointments  ?Date Time Provider [place]  ?06/05/2021  8:45 AM [facility] [place]  ?06/22/2021  8:30 AM [facility]  ?07/28/2021  3:40 PM Tobb, Godfrey Pick, DO [facility] None  ? ? ?No orders of the  defined types were placed in this encounter. ? ? ?Janyth Pupa, DO ?Attending [place], Faculty Practice ?[facility] ? ? ? ?

## 2021-06-05 ENCOUNTER — Ambulatory Visit: Payer: Commercial Managed Care - PPO | Admitting: Clinical

## 2021-06-05 DIAGNOSIS — F411 Generalized anxiety disorder: Secondary | ICD-10-CM

## 2021-06-05 NOTE — Patient Instructions (Signed)
[facility] ?[address] ?[place], [place] [address] ?[phone] (main office) ?[phone] (Carold Eisner's office) ? ? ? ? ?BRAINSTORMING ? ?Develop a Plan ?Goals: ?Provide a way to start conversation about your new life with a baby ?Assist parents in recognizing and using resources within their reach ?Help pave the way before birth for an easier period of transition afterwards. ? ?Make a list of the following information to keep in a central location: ?Full name of Mom and Partner: _____________________________________________ ?Baby's full name and Date of Birth: ___________________________________________ ?Home Address: ___________________________________________________________ ?________________________________________________________________________ ?Home Phone: ____________________________________________________________ ?Parents' cell numbers: _____________________________________________________ ?________________________________________________________________________ ?Name and contact info for OB: ______________________________________________ ?Name and contact info for Pediatrician:________________________________________ ?Contact info for Lactation Consultants: ________________________________________ ? ?REST and SLEEP ?*You each need at least 4-5 hours of uninterrupted sleep every day. Write specific names and contact information.* ?How are you going to rest in the postpartum period? While partner's home? When partner returns to work? When you both return to work? ?Where will your baby sleep? ?Who is available to help during the day? Evening? Night? ?Who could move in for a period to help support you? ?What are some ideas to help you get enough  sleep? ?__________________________________________________________________________________________________________________________________________________________________________________________________________________________________________ ?NUTRITIOUS FOOD AND DRINK ?*Plan for meals before your baby is born so you can have healthy food to eat during the immediate postpartum period.* ?Who will look after breakfast? Lunch? Dinner? List names and contact information. Brainstorm quick, healthy ideas for each meal. ?What can you do before baby is born to prepare meals for the postpartum period? ?How can others help you with meals? ?Which grocery stores provide online shopping and delivery? ?Which restaurants offer take-out or delivery options? ?______________________________________________________________________________________________________________________________________________________________________________________________________________________________________________________________________________________________________________________________________________________________________________________________________ ? ?CARE FOR MOM ?*It's important that mom is cared for and pampered in the postpartum period. Remember, the most important ways new mothers need care are: sleep, nutrition, gentle exercise, and time off.* ?Who can come take care of mom during this period? Make a list of people with their contact information. ?List some activities that make you feel cared for, rested, and energized? Who can make sure you have opportunities to do these things? ?Does mom have a space of her very own within your home that's just for her? Make a ?Mama Cave? where she can be comfortable, rest, and renew herself  daily. ?______________________________________________________________________________________________________________________________________________________________________________________________________________________________________________________________________________________________________________________________________________________________________________________________________ ? ? ? ?CARE FOR AND FEEDING BABY ?*Knowledgeable and encouraging people will offer the best support with regard to feeding your baby.* ?Educate yourself and choose the best feeding option for your baby. ?Make a list of people who will guide, support, and be a resource for you as your care for and feed your baby. (Friends that have breastfed or are currently breastfeeding, lactation consultants, breastfeeding support groups, etc.) ?Consider a postpartum doula. (These websites can give you information: dona.org & padanc.org) ?Seek out local breastfeeding resources like the breastfeeding support group at [facility] or [employer]. ?______________________________________________________________________________________________________________________________________________________________________________________________________________________________________________________________________________________________________________________________________________________________________________________________________ ? ?CHORES AND ERRANDS ?Who can help with a thorough cleaning before baby is born? ?Make a list of people who will help with housekeeping and chores, like laundry, light cleaning, dishes, bathrooms, etc. ?Who can run some errands for you? ?What can you do to make sure you are stocked with basic supplies before baby is born? ?Who is going to do the  shopping? ?______________________________________________________________________________________________________________________________________________________________________________________________________________________________________________________________________________________________________________________________________________________________________________________________________ ? ? ? ? ?Family Adjustment ?*Nurture yourselves?it helps parents be more loving and allows for better bonding with their child.* ?What sorts of things do you and partner enjoy   doing together? Which activities help you to connect and strengthen your relationship? Make a list of those things. Make a list of people whom you trust to care for your baby so you can have some time together as a couple. ?What types of things help partner feel connected to Mom? Make a list. ?What needs will partner have in order to bond with baby? ?Other children? Who will care for them when you go into labor and while you are in the hospital? ?Think about what the needs of your older children might be. Who can help you meet those needs? In what ways are you helping them prepare for bringing baby home? List some specific strategies you have for family adjustment. ?_______________________________________________________________________________________________________________________________________________________________________________________________________________________________________________________________________________________________________________________________________________ ? ?SUPPORT ?*Someone who can empathize with experiences normalizes your problems and makes them more bearable.* ?Make a list of other friends, neighbors, and/or co-workers you know with infants (and small children, if applicable) with whom you can connect. ?Make a list of local or online support groups, mom groups, etc. in which you can be  involved. ?______________________________________________________________________________________________________________________________________________________________________________________________________________________________________________________________________________________________________________________________________________________________________________________________________ ? ?Childcare Plans ?Investigate and plan for childcare if mom is returning to work. ?Talk about mom's concerns about her transition back to work. ?Talk about partner's concerns regarding this transition. ? ?Mental Health ?*Your mental health is one of the highest priorities for a pregnant or postpartum mom.* ?1 in 5 women experience anxiety and/or depression from the time of conception through the first year a

## 2021-06-06 ENCOUNTER — Encounter: Payer: Self-pay | Admitting: Cardiology

## 2021-06-22 ENCOUNTER — Encounter: Payer: Self-pay | Admitting: Obstetrics & Gynecology

## 2021-06-22 ENCOUNTER — Ambulatory Visit (INDEPENDENT_AMBULATORY_CARE_PROVIDER_SITE_OTHER): Payer: Commercial Managed Care - PPO | Admitting: Obstetrics & Gynecology

## 2021-06-22 ENCOUNTER — Other Ambulatory Visit: Payer: Commercial Managed Care - PPO

## 2021-06-22 VITALS — BP 121/71 | HR 75 | Wt 276.0 lb

## 2021-06-22 DIAGNOSIS — Z131 Encounter for screening for diabetes mellitus: Secondary | ICD-10-CM

## 2021-06-22 DIAGNOSIS — O0992 Supervision of high risk pregnancy, unspecified, second trimester: Secondary | ICD-10-CM

## 2021-06-22 DIAGNOSIS — Z3A27 27 weeks gestation of pregnancy: Secondary | ICD-10-CM

## 2021-06-22 DIAGNOSIS — Z3402 Encounter for supervision of normal first pregnancy, second trimester: Secondary | ICD-10-CM

## 2021-06-22 LAB — POCT URINALYSIS DIPSTICK OB
Blood, UA: NEGATIVE
Glucose, UA: NEGATIVE
Leukocytes, UA: NEGATIVE
Nitrite, UA: NEGATIVE

## 2021-06-22 NOTE — Progress Notes (Signed)
LOW-RISK PREGNANCY VISIT Patient name: Kristi West MRN [id number]  Date of birth: 12-16-1990 Chief Complaint:   Routine Prenatal Visit and High Risk Gestation  History of Present Illness:   Kristi West is a 31 y.o. G70P2012 female at [redacted]w[redacted]d with an Estimated Date of Delivery: 09/20/21 being seen today for ongoing management of a low-risk pregnancy.     06/22/2021    9:19 AM 06/05/2021    8:50 AM 03/09/2021    9:57 AM 11/18/2017    5:41 PM 06/24/2017    4:29 PM  Depression screen PHQ 2/9  Decreased Interest 1 1 1  0 0  Down, Depressed, Hopeless 1 1 1  0 0  PHQ - 2 Score 2 2 2  0 0  Altered sleeping 1 2 0    Tired, decreased energy 1 2 2     Change in appetite 1 0 1    Feeling bad or failure about yourself  0 0 1    Trouble concentrating 1 0 0    Moving slowly or fidgety/restless 0 0 0    Suicidal thoughts 0 0 0    PHQ-9 Score 6 6 6       Today she reports pelvic pressure. Contractions: Irritability. Vag. Bleeding: None.  Movement: Present. denies leaking of fluid. Review of Systems:   Pertinent items are noted in HPI Denies abnormal vaginal discharge w/ itching/odor/irritation, headaches, visual changes, shortness of breath, chest pain, abdominal pain, severe nausea/vomiting, or problems with urination or bowel movements unless otherwise stated above. Pertinent History Reviewed:  Reviewed past medical,surgical, social, obstetrical and family history.  Reviewed problem list, medications and allergies. Physical Assessment:   Vitals:   06/22/21 0910  BP: 121/71  Pulse: 75  Weight: 276 lb (125.2 kg)  Body mass index is 43.23 kg/m.        Physical Examination:   General appearance: Well appearing, and in no distress  Mental status: Alert, oriented to person, place, and time  Skin: Warm & dry  Cardiovascular: Normal heart rate noted  Respiratory: Normal respiratory effort, no distress  Abdomen: Soft, gravid, nontender  Pelvic: Cervical exam deferred         Extremities: Edema:  None  Fetal Status:     Movement: Present    Chaperone: n/a    Results for orders placed or performed in visit on 06/22/21 (from the past 24 hour(s))  POC Urinalysis Dipstick OB   Collection Time: 06/22/21  9:21 AM  Result Value Ref Range   Color, UA     Clarity, UA     Glucose, UA Negative Negative   Bilirubin, UA     Ketones, UA mod    Spec Grav, UA     Blood, UA neg    pH, UA     POC,PROTEIN,UA Trace Negative, Trace, Small (1+), Moderate (2+), Large (3+), 4+   Urobilinogen, UA     Nitrite, UA neg    Leukocytes, UA Negative Negative   Appearance     Odor      Assessment & Plan:  1) Low-risk pregnancy at [redacted]w[redacted]d with an Estimated Date of Delivery: 09/20/21   2) pelvic pressure from multiparity,    Meds: No orders of the defined types were placed in this encounter.  Labs/procedures today: PN2  Plan:  Continue routine obstetrical care  Next visit: prefers in person    Reviewed: Preterm labor symptoms and general obstetric precautions including but not limited to vaginal bleeding, contractions, leaking of fluid and fetal  movement were reviewed in detail with the patient.  All questions were answered. Has home bp cuff. Rx faxed to . Check bp weekly, let us know if >140/90.   Follow-up: Return in about 3 weeks (around 07/13/2021) for LROB.  Orders Placed This Encounter  Procedures   POC Urinalysis Dipstick OB    Lazaro Arms, MD 06/22/2021 9:47 AM

## 2021-06-23 LAB — GLUCOSE TOLERANCE, 2 HOURS W/ 1HR
Glucose, 1 hour: 91 mg/dL (ref 70–179)
Glucose, 2 hour: 98 mg/dL (ref 70–152)
Glucose, Fasting: 77 mg/dL (ref 70–91)

## 2021-06-23 LAB — CBC
Hematocrit: 35.5 % (ref 34.0–46.6)
Hemoglobin: 12.4 g/dL (ref 11.1–15.9)
MCH: 31.6 pg (ref 26.6–33.0)
MCHC: 34.9 g/dL (ref 31.5–35.7)
MCV: 90 fL (ref 79–97)
Platelets: 263 10*3/uL (ref 150–450)
RBC: 3.93 x10E6/uL (ref 3.77–5.28)
RDW: 12.1 % (ref 11.7–15.4)
WBC: 7.7 10*3/uL (ref 3.4–10.8)

## 2021-06-23 LAB — HIV ANTIBODY (ROUTINE TESTING W REFLEX): HIV Screen 4th Generation wRfx: NONREACTIVE

## 2021-06-23 LAB — RPR: RPR Ser Ql: NONREACTIVE

## 2021-06-23 LAB — ANTIBODY SCREEN: Antibody Screen: NEGATIVE

## 2021-07-13 ENCOUNTER — Ambulatory Visit (INDEPENDENT_AMBULATORY_CARE_PROVIDER_SITE_OTHER): Payer: Commercial Managed Care - PPO | Admitting: Obstetrics & Gynecology

## 2021-07-13 ENCOUNTER — Encounter: Payer: Self-pay | Admitting: Obstetrics & Gynecology

## 2021-07-13 VITALS — BP 117/76 | HR 97 | Wt 278.0 lb

## 2021-07-13 DIAGNOSIS — Z3493 Encounter for supervision of normal pregnancy, unspecified, third trimester: Secondary | ICD-10-CM

## 2021-07-28 ENCOUNTER — Ambulatory Visit (INDEPENDENT_AMBULATORY_CARE_PROVIDER_SITE_OTHER): Payer: Commercial Managed Care - PPO | Admitting: Cardiology

## 2021-07-28 ENCOUNTER — Encounter: Payer: Self-pay | Admitting: Cardiology

## 2021-07-28 VITALS — BP 120/82 | HR 79 | Ht 67.0 in | Wt 274.9 lb

## 2021-07-28 DIAGNOSIS — I480 Paroxysmal atrial fibrillation: Secondary | ICD-10-CM | POA: Diagnosis not present

## 2021-07-28 DIAGNOSIS — I493 Ventricular premature depolarization: Secondary | ICD-10-CM | POA: Diagnosis not present

## 2021-07-28 NOTE — Progress Notes (Signed)
Cardio-Obstetrics Clinic  Follow Up Note   Date:  07/28/2021   ID:  QUINTANA CANELO, DOB [birth year], MRN [id number]  PCP:  Junie Spencer, FNP   [facility] Providers Cardiologist:  Thomasene Ripple, DO  Electrophysiologist:  None        Referring MD: Junie Spencer, FNP   Chief Complaint: " I am doing well"  History of Present Illness:    Kristi West is a 31 y.o. female [G4P2012] who returns for follow up for cardio-obstetrics. Medical history of paroxysmal atrial fibrillation which was diagnosed in 2017 during her first pregnancy.  At that time she was treated at [facility].  She had an echocardiogram which was normal.  Postpartum she really did not follow-up with cardiology. In 2019 the patient tells me that she had an episode where she was seen at [facility] when she was in atrial fibrillation.  Since that time she has not had any recurrences.  I did see the patient in April 2023 at that time it was her first visit.  She was in sinus rhythm.  She has been experiencing intermittent palpitations placed a monitor with the patient.  She had been given flecainide as needed by her OB/GYN I did keep the patient on this given that this medication is safe in pregnancy.  She did wear the monitor which showed evidence of symptomatic PVCs with no atrial fibrillation.   Prior CV Studies Reviewed: The following studies were reviewed today:  Zio monintor  Patch Wear Time:  11 days and 21 hours (2023-04-17T23:48:33-0400 to 2023-04-29T21:38:19-0400)   Patient had a min HR of 60 bpm, max HR of 174 bpm, and avg HR of 95 bpm. Predominant underlying rhythm was Sinus Rhythm. Slight P wave morphology changes were noted. Isolated SVEs were rare (<1.0%), SVE Couplets were rare (<1.0%), and SVE Triplets were  rare (<1.0%). Isolated VEs were rare (<1.0%), VE Couplets were rare (<1.0%), and no VE Triplets were present.   Symptoms associated with rare premature ventricular complexes   Conclusion:  Symptomatic rare premature ventricular complexes.   Past Medical History:  Diagnosis Date   A-fib (HCC)    Abnormal Pap smear    Albinism (HCC)    Anemia    Anxiety    Atrial fibrillation, unspecified    Heart murmur    Pregnant 07/07/2012   Seizures (HCC)    x1 as a child   Vaginal Pap smear, abnormal     Past Surgical History:  Procedure Laterality Date   TONSILLECTOMY     WISDOM TOOTH EXTRACTION        OB History     Gravida  4   Para  2   Term  2   Preterm      AB  1   Living  2      SAB      IAB      Ectopic      Multiple  0   Live Births  2               Current Medications: Current Meds  Medication Sig   aspirin 81 MG EC tablet Take 2 tablets (162 mg total) by mouth daily. Swallow whole.   flecainide (TAMBOCOR) 100 MG tablet Take 1 orally if atrial fibrillation persists.  Repeat in 1 hour if persists.  May repeat 1 more time in 4 hours if needed.  If persists beyond this, visit Emergency Department   Prenatal Vit-Fe Fumarate-FA (MULTIVITAMIN-PRENATAL) 27-0.8  MG TABS tablet Take 1 tablet by mouth daily at 12 noon.     Allergies:   Ceclor [cefaclor], Other, and Peanut-containing drug products   Social History   Socioeconomic History   Marital status: Unknown    Spouse name: Not on file   Number of children: 2   Years of education: Not on file   Highest education level: Not on file  Occupational History   Not on file  Tobacco Use   Smoking status: Former    Years: 0.50    Types: Cigarettes   Smokeless tobacco: Never  Vaping Use   Vaping Use: Never used  Substance and Sexual Activity   Alcohol use: Not Currently    Comment: occa   Drug use: No   Sexual activity: Yes    Birth control/protection: Condom  Other Topics Concern   Not on file  Social History Narrative   Not on file   Social Determinants of Health   Financial Resource Strain: Low Risk  (06/22/2021)   Overall Financial Resource Strain (CARDIA)    Difficulty of  Paying Living Expenses: Not hard at all  Food Insecurity: No Food Insecurity (06/22/2021)   Hunger Vital Sign    Worried About Running Out of Food in the Last Year: Never true    Ran Out of Food in the Last Year: Never true  Transportation Needs: No Transportation Needs (06/22/2021)   PRAPARE - Administrator, Civil Service (Medical): No    Lack of Transportation (Non-Medical): No  Physical Activity: Insufficiently Active (06/22/2021)   Exercise Vital Sign    Days of Exercise per Week: 2 days    Minutes of Exercise per Session: 10 min  Stress: Stress Concern Present (06/22/2021)   [employer] of Occupational Health - Occupational Stress Questionnaire    Feeling of Stress : To some extent  Social Connections: Moderately Integrated (06/22/2021)   Social Connection and Isolation Panel [NHANES]    Frequency of Communication with Friends and Family: More than three times a week    Frequency of Social Gatherings with Friends and Family: Once a week    Attends Religious Services: More than 4 times per year    Active Member of [employer] or Organizations: No    Attends Engineer, structural: More than 4 times per year    Marital Status: Separated      Family History  Problem Relation Age of Onset   SIDS Brother    Cancer Maternal Aunt        stomach   Cancer Maternal Grandmother        leukemia   Diabetes Paternal Grandmother    Stroke Paternal Grandfather       ROS:   Please see the history of present illness.     All other systems reviewed and are negative.   Labs/EKG Reviewed:    EKG:   EKG is was ordered today.     Recent Labs: 06/22/2021: Hemoglobin 12.4; Platelets 263   Recent Lipid Panel Lab Results  Component Value Date/Time   CHOL 180 12/03/2014 03:34 PM   TRIG 99 12/03/2014 03:34 PM   HDL 48 12/03/2014 03:34 PM   CHOLHDL 3.8 12/03/2014 03:34 PM   LDLCALC 112 (H) 12/03/2014 03:34 PM    Physical Exam:    VS:  BP 120/82   Pulse 79   Ht 5\' 7"   (1.702 m)   Wt 274 lb 14.4 oz (124.7 kg)   LMP 12/14/2020  SpO2 98%   BMI 43.06 kg/m     Wt Readings from Last 3 Encounters:  07/28/21 274 lb 14.4 oz (124.7 kg)  07/13/21 278 lb (126.1 kg)  06/22/21 276 lb (125.2 kg)     GEN:  Well nourished, well developed in no acute distress HEENT: Normal NECK: No JVD; No carotid bruits LYMPHATICS: No lymphadenopathy CARDIAC: RRR, no murmurs, rubs, gallops RESPIRATORY:  Clear to auscultation without rales, wheezing or rhonchi  ABDOMEN: Soft, non-tender, non-distended MUSCULOSKELETAL:  No edema; No deformity  SKIN: Warm and dry NEUROLOGIC:  Alert and oriented x 3 PSYCHIATRIC:  Normal affect    Risk Assessment/Risk Calculators:     CARPREG II Risk Prediction Index Score:  1.  The patient's risk for a primary cardiac event is 5%.            ASSESSMENT & PLAN:    PAF  Obesity in pregnancy  Symptomatic PVC  She is doing well from a CV standpoint. She has not needed the flecainide. The patient understands the need to lose weight with diet and exercise. We have discussed specific strategies for this.  Patient Instructions  Medication Instructions:  Your physician recommends that you continue on your current medications as directed. Please refer to the Current Medication list given to you today.  *If you need a refill on your cardiac medications before your next appointment, please call your pharmacy*   Lab Work: NONE If you have labs (blood work) drawn today and your tests are completely normal, you will receive your results only by: MyChart Message (if you have MyChart) OR A paper copy in the mail If you have any lab test that is abnormal or we need to change your treatment, we will call you to review the results.   Testing/Procedures: NONE   Follow-Up: At [facility], you and your health needs are our priority.  As part of our continuing mission to provide you with exceptional heart care, we have created designated  Provider Care Teams.  These Care Teams include your primary Cardiologist (physician) and Advanced Practice Providers (APPs -  Physician Assistants and Nurse Practitioners) who all work together to provide you with the care you need, when you need it.  Your next appointment:   12 week(s)  The format for your next appointment:   Virtual Visit   Provider:   Thomasene Ripple, DO     Dispo:  Return in about 12 weeks (around 10/20/2021).   Medication Adjustments/Labs and Tests Ordered: Current medicines are reviewed at length with the patient today.  Concerns regarding medicines are outlined above.  Tests Ordered: No orders of the defined types were placed in this encounter.  Medication Changes: No orders of the defined types were placed in this encounter.

## 2021-07-28 NOTE — Patient Instructions (Addendum)
Medication Instructions:  Your physician recommends that you continue on your current medications as directed. Please refer to the Current Medication list given to you today.  *If you need a refill on your cardiac medications before your next appointment, please call your pharmacy*   Lab Work: NONE If you have labs (blood work) drawn today and your tests are completely normal, you will receive your results only by: MyChart Message (if you have MyChart) OR A paper copy in the mail If you have any lab test that is abnormal or we need to change your treatment, we will call you to review the results.   Testing/Procedures: NONE   Follow-Up: At [facility], you and your health needs are our priority.  As part of our continuing mission to provide you with exceptional heart care, we have created designated Provider Care Teams.  These Care Teams include your primary Cardiologist (physician) and Advanced Practice Providers (APPs -  Physician Assistants and Nurse Practitioners) who all work together to provide you with the care you need, when you need it.  Your next appointment:   12 week(s)  The format for your next appointment:   Virtual Visit   Provider:   Thomasene Ripple, DO

## 2021-08-03 ENCOUNTER — Ambulatory Visit (INDEPENDENT_AMBULATORY_CARE_PROVIDER_SITE_OTHER): Payer: Commercial Managed Care - PPO | Admitting: Advanced Practice Midwife

## 2021-08-03 ENCOUNTER — Encounter: Payer: Self-pay | Admitting: Advanced Practice Midwife

## 2021-08-03 VITALS — BP 119/79 | HR 79 | Wt 276.0 lb

## 2021-08-03 DIAGNOSIS — Z3A33 33 weeks gestation of pregnancy: Secondary | ICD-10-CM

## 2021-08-03 DIAGNOSIS — Z3480 Encounter for supervision of other normal pregnancy, unspecified trimester: Secondary | ICD-10-CM

## 2021-08-03 NOTE — Patient Instructions (Signed)
Spinning Babies exercises

## 2021-08-03 NOTE — Progress Notes (Signed)
   LOW-RISK PREGNANCY VISIT Patient name: Kristi West MRN [id number]  Date of birth: 02-May-1990 Chief Complaint:   Routine Prenatal Visit  History of Present Illness:   Kristi West is a 31 y.o. [phone] female at [redacted]w[redacted]d with an Estimated Date of Delivery: 09/20/21 being seen today for ongoing management of a low-risk pregnancy.  Today she reports  pelvic pressure. Already has a prenatal cradle.  Pain is gone, replaced w/pressure . Contractions: Irritability. Vag. Bleeding: None.  Movement: Present. denies leaking of fluid. Review of Systems:   Pertinent items are noted in HPI Denies abnormal vaginal discharge w/ itching/odor/irritation, headaches, visual changes, shortness of breath, chest pain, abdominal pain, severe nausea/vomiting, or problems with urination or bowel movements unless otherwise stated above. Pertinent History Reviewed:  Reviewed past medical,surgical, social, obstetrical and family history.  Reviewed problem list, medications and allergies. Physical Assessment:   Vitals:   08/03/21 0840  BP: 119/79  Pulse: 79  Weight: 276 lb (125.2 kg)  Body mass index is 43.23 kg/m.        Physical Examination:   General appearance: Well appearing, and in no distress  Mental status: Alert, oriented to person, place, and time  Skin: Warm & dry  Cardiovascular: Normal heart rate noted  Respiratory: Normal respiratory effort, no distress  Abdomen: Soft, gravid, nontender  Pelvic: Cervical exam deferred         Extremities: Edema: None  Fetal Status: Fetal Heart Rate (bpm): 148 Fundal Height: 34 cm Movement: Present    Chaperone: n/a    No results found for this or any previous visit (from the past 24 hour(s)).  Assessment & Plan:  1) Low-risk pregnancy [id number] at [redacted]w[redacted]d with an Estimated Date of Delivery: 09/20/21      Meds: No orders of the defined types were placed in this encounter.  Labs/procedures today: none  Plan:  Continue routine obstetrical care  Next visit:  prefers in person    Reviewed: Preterm labor symptoms and general obstetric precautions including but not limited to vaginal bleeding, contractions, leaking of fluid and fetal movement were reviewed in detail with the patient.  All questions were answered. Has home bp cuff.. Check bp weekly, let us know if >140/90.   Follow-up: Return in about 2 weeks (around 08/17/2021) for LROB.  No orders of the defined types were placed in this encounter.  Jacklyn Shell DNP, CNM 08/03/2021 8:59 AM

## 2021-08-08 ENCOUNTER — Encounter: Payer: Self-pay | Admitting: Advanced Practice Midwife

## 2021-08-17 ENCOUNTER — Ambulatory Visit (INDEPENDENT_AMBULATORY_CARE_PROVIDER_SITE_OTHER): Payer: Commercial Managed Care - PPO | Admitting: Advanced Practice Midwife

## 2021-08-17 ENCOUNTER — Encounter: Payer: Self-pay | Admitting: Advanced Practice Midwife

## 2021-08-17 VITALS — BP 125/88 | HR 82 | Wt 279.0 lb

## 2021-08-17 DIAGNOSIS — Z3480 Encounter for supervision of other normal pregnancy, unspecified trimester: Secondary | ICD-10-CM

## 2021-08-17 DIAGNOSIS — Z3A35 35 weeks gestation of pregnancy: Secondary | ICD-10-CM

## 2021-08-17 NOTE — Progress Notes (Signed)
   LOW-RISK PREGNANCY VISIT Patient name: Kristi West MRN [id number]  Date of birth: 1990/01/30 Chief Complaint:   Routine Prenatal Visit (Cervix check having regular contraction)  History of Present Illness:   Kristi West is a 31 y.o. G31P2012 female at [redacted]w[redacted]d with an Estimated Date of Delivery: 09/20/21 being seen today for ongoing management of a low-risk pregnancy.  Today she reports contracting every day.  She plans to go out of work in the next week or so d/t "increased pain and pressure" (she gets several months of leave, employer said she just needs a note.) Contractions: Regular. Vag. Bleeding: None.  Movement: Present. denies leaking of fluid. Review of Systems:   Pertinent items are noted in HPI Denies abnormal vaginal discharge w/ itching/odor/irritation, headaches, visual changes, shortness of breath, chest pain, abdominal pain, severe nausea/vomiting, or problems with urination or bowel movements unless otherwise stated above. Pertinent History Reviewed:  Reviewed past medical,surgical, social, obstetrical and family history.  Reviewed problem list, medications and allergies. Physical Assessment:   Vitals:   08/17/21 1424  BP: 125/88  Pulse: 82  Weight: 279 lb (126.6 kg)  Body mass index is 43.7 kg/m.        Physical Examination:   General appearance: Well appearing, and in no distress  Mental status: Alert, oriented to person, place, and time  Skin: Warm & dry  Cardiovascular: Normal heart rate noted  Respiratory: Normal respiratory effort, no distress  Abdomen: Soft, gravid, nontender  Pelvic: Cervical exam performed  Dilation: 4 Effacement (%): Thick Station: -3  Extremities: Edema: Trace  Fetal Status: Fetal Heart Rate (bpm): 140 Fundal Height: 35 cm Movement: Present Presentation: Vertex  Chaperone: Peggy Dones    No results found for this or any previous visit (from the past 24 hour(s)).  Assessment & Plan:  1) Low-risk pregnancy [id number] at [redacted]w[redacted]d with an  Estimated Date of Delivery: 09/20/21   2) advanced cervical dilation vs early labor, d/t cx still being thick, probably the former, but labor precautions given   Meds: No orders of the defined types were placed in this encounter.  Labs/procedures today:   Plan:  Continue routine obstetrical care  Next visit: prefers in person    Reviewed: Term labor symptoms and general obstetric precautions including but not limited to vaginal bleeding, contractions, leaking of fluid and fetal movement were reviewed in detail with the patient.  All questions were answered. Has home bp cuff.. Check bp weekly, let us know if >140/90.   Follow-up: Return in about 1 week (around 08/24/2021) for LROB.  No orders of the defined types were placed in this encounter.  Jacklyn Shell DNP, CNM 08/17/2021 3:07 PM

## 2021-08-18 ENCOUNTER — Encounter: Payer: Self-pay | Admitting: *Deleted

## 2021-08-19 ENCOUNTER — Other Ambulatory Visit: Payer: Self-pay

## 2021-08-19 ENCOUNTER — Inpatient Hospital Stay (HOSPITAL_COMMUNITY)
Admission: AD | Admit: 2021-08-19 | Discharge: 2021-08-19 | Disposition: A | Discharge status: Home / Self Care | Payer: Commercial Managed Care - PPO | Attending: Obstetrics and Gynecology | Admitting: Obstetrics and Gynecology

## 2021-08-19 ENCOUNTER — Encounter (HOSPITAL_COMMUNITY): Payer: Self-pay | Admitting: Obstetrics and Gynecology

## 2021-08-19 DIAGNOSIS — O4703 False labor before 37 completed weeks of gestation, third trimester: Secondary | ICD-10-CM | POA: Insufficient documentation

## 2021-08-19 DIAGNOSIS — Z3A35 35 weeks gestation of pregnancy: Secondary | ICD-10-CM | POA: Diagnosis not present

## 2021-08-19 DIAGNOSIS — O479 False labor, unspecified: Secondary | ICD-10-CM

## 2021-08-19 LAB — URINALYSIS, ROUTINE W REFLEX MICROSCOPIC
Bilirubin Urine: NEGATIVE
Glucose, UA: NEGATIVE mg/dL
Hgb urine dipstick: NEGATIVE
Ketones, ur: NEGATIVE mg/dL
Nitrite: NEGATIVE
Protein, ur: 30 mg/dL — AB
Specific Gravity, Urine: 1.023 (ref 1.005–1.030)
WBC, UA: >50 WBC/hpf — ABNORMAL HIGH (ref 0–5)
pH: 6 (ref 5.0–8.0)

## 2021-08-19 NOTE — MAU Note (Signed)
.  Kristi West is a 31 y.o. at [redacted]w[redacted]d here in MAU reporting: she was having ctx q 10 min this morning . Ctx have lessened now but was worried because she was 4 cm in office yesterday. Denies any vag bleeding or leaking at  tis time. Good fetal movement reported.  LMP:  Onset of complaint: this morning Pain score: 5 Vitals:   08/19/21 0924  BP: 120/76  Pulse: 97  Resp: 18  Temp: 97.8 F (36.6 C)     FHT:145 Lab orders placed from triage:  u/a

## 2021-08-19 NOTE — MAU Provider Note (Signed)
History     CSN: [id number]  Arrival date and time: 08/19/21 0858   None     Chief Complaint  Patient presents with   Contractions   HPI Kristi West is a 30 y.o. [id number] at [redacted]w[redacted]d who presents to MAU for contractions. Patient reports ongoing contractions every 10 minutes for the past several days however when Kristi West woke up this morning they were more intense than they had been. Kristi West reports since her arrival to MAU the contractions have spaced out and are not as intense as they were. Kristi West reports Kristi West has only felt 3 contractions since arrival. Kristi West was concerned as Kristi West was dilated 4cm in the office yesterday. Kristi West denies leaking fluid or vaginal bleeding. Endorses active fetal movement.  Patient receives prenatal care at [facility]. Next appointment is scheduled on 8/2.   OB History     Gravida  4   Para  2   Term  2   Preterm      AB  1   Living  2      SAB      IAB      Ectopic      Multiple  0   Live Births  2           Past Medical History:  Diagnosis Date   A-fib (HCC)    Abnormal Pap smear    Albinism (HCC)    Anemia    Anxiety    Atrial fibrillation, unspecified    Heart murmur    Pregnant 07/07/2012   Seizures (HCC)    x1 as a child   Vaginal Pap smear, abnormal     Past Surgical History:  Procedure Laterality Date   TONSILLECTOMY     WISDOM TOOTH EXTRACTION      Family History  Problem Relation Age of Onset   SIDS Brother    Cancer Maternal Aunt        stomach   Cancer Maternal Grandmother        leukemia   Diabetes Paternal Grandmother    Stroke Paternal Grandfather     Social History   Tobacco Use   Smoking status: Former    Years: 0.50    Types: Cigarettes   Smokeless tobacco: Never  Vaping Use   Vaping Use: Never used  Substance Use Topics   Alcohol use: Not Currently    Comment: occa   Drug use: No    Allergies:  Allergies  Allergen Reactions   Ceclor [Cefaclor] Hives   Other Itching and Swelling     Fresh fruits cause the patient's mouth and throat to itch and swell.   Peanut-Containing Drug Products Itching and Swelling    All types of nuts cause the patient's mouth and throat to swell and itch.    Medications Prior to Admission  Medication Sig Dispense Refill Last Dose   aspirin 81 MG EC tablet Take 2 tablets (162 mg total) by mouth daily. Swallow whole. 90 tablet 3 08/18/2021   Prenatal Vit-Fe Fumarate-FA (MULTIVITAMIN-PRENATAL) 27-0.8 MG TABS tablet Take 1 tablet by mouth daily at 12 noon.   08/18/2021   flecainide (TAMBOCOR) 100 MG tablet Take 1 orally if atrial fibrillation persists.  Repeat in 1 hour if persists.  May repeat 1 more time in 4 hours if needed.  If persists beyond this, visit Emergency Department (Patient not taking: Reported on 08/03/2021) 30 tablet 1     Review of Systems  Constitutional: Negative.   Respiratory:  Negative.    Cardiovascular: Negative.   Gastrointestinal:  Positive for abdominal pain (contractions).  Genitourinary: Negative.   Musculoskeletal: Negative.   Neurological: Negative.    Physical Exam   Patient Vitals for the past 24 hrs:  BP Temp Pulse Resp Height Weight  08/19/21 1146 132/78 -- 76 -- -- --  08/19/21 0924 120/76 97.8 F (36.6 C) 97 18 5\' 7"  (1.702 m) 124.7 kg    Physical Exam Vitals and nursing note reviewed. Exam conducted with a chaperone present.  Constitutional:      General: Kristi West is not in acute distress. Eyes:     Extraocular Movements: Extraocular movements intact.     Pupils: Pupils are equal, round, and reactive to light.  Cardiovascular:     Rate and Rhythm: Normal rate.  Pulmonary:     Effort: Pulmonary effort is normal.  Abdominal:     Palpations: Abdomen is soft.     Tenderness: There is no abdominal tenderness.     Comments: gravid  Musculoskeletal:        General: Normal range of motion.     Cervical back: Normal range of motion.  Skin:    General: Skin is warm and dry.  Neurological:     General: No  focal deficit present.     Mental Status: Kristi West is alert and oriented to person, place, and time.  Psychiatric:        Mood and Affect: Mood normal.        Behavior: Behavior normal.        Judgment: Judgment normal.   Dilation: 3 Effacement (%): Thick Cervical Position: Posterior Station: -3 Presentation: Vertex Exam by:: D.Lamberto Dinapoli,CNM  NST FHR: 145 bpm, moderate variability, +15x15 accels, no decels Toco: irregular Q 4-12 mins  MAU Course  Procedures NST  MDM UA, culture pending Cervix unchanged from exam in office. I offered PO hydration vs IVF's and patient opts for PO hydration. Cervix rechecked and unchanged NST reassuring for gestational age  Assessment and Plan  [redacted] weeks gestation of pregnancy Braxton hicks contractions  - Discharge at home in stable condition - Strict return precautions. Return to MAU as needed for worsening symptoms - Keep OB appointment as scheduled on 8/2    10/2, CNM 08/19/2021, 11:59 AM

## 2021-08-20 LAB — CULTURE, OB URINE

## 2021-08-23 ENCOUNTER — Ambulatory Visit (INDEPENDENT_AMBULATORY_CARE_PROVIDER_SITE_OTHER): Payer: Commercial Managed Care - PPO | Admitting: Advanced Practice Midwife

## 2021-08-23 ENCOUNTER — Other Ambulatory Visit (HOSPITAL_COMMUNITY)
Admission: RE | Admit: 2021-08-23 | Discharge: 2021-08-23 | Disposition: A | Discharge status: Home / Self Care | Payer: Commercial Managed Care - PPO | Source: Ambulatory Visit | Attending: Advanced Practice Midwife | Admitting: Advanced Practice Midwife

## 2021-08-23 VITALS — BP 124/83 | HR 84 | Wt 277.0 lb

## 2021-08-23 DIAGNOSIS — Z3480 Encounter for supervision of other normal pregnancy, unspecified trimester: Secondary | ICD-10-CM

## 2021-08-23 DIAGNOSIS — Z3483 Encounter for supervision of other normal pregnancy, third trimester: Secondary | ICD-10-CM | POA: Insufficient documentation

## 2021-08-23 DIAGNOSIS — Z3A36 36 weeks gestation of pregnancy: Secondary | ICD-10-CM

## 2021-08-23 NOTE — Progress Notes (Signed)
   LOW-RISK PREGNANCY VISIT Patient name: Kristi West MRN [id number]  Date of birth: 09-30-1990 Chief Complaint:   Routine Prenatal Visit  History of Present Illness:   Kristi West is a 31 y.o. [phone] female at [redacted]w[redacted]d with an Estimated Date of Delivery: 09/20/21 being seen today for ongoing management of a low-risk pregnancy.  Today she reports  discomforts of late preg . Contractions: Irregular. Vag. Bleeding: None.  Movement: Present. denies leaking of fluid. Review of Systems:   Pertinent items are noted in HPI Denies abnormal vaginal discharge w/ itching/odor/irritation, headaches, visual changes, shortness of breath, chest pain, abdominal pain, severe nausea/vomiting, or problems with urination or bowel movements unless otherwise stated above. Pertinent History Reviewed:  Reviewed past medical,surgical, social, obstetrical and family history.  Reviewed problem list, medications and allergies. Physical Assessment:   Vitals:   08/23/21 0909  BP: 124/83  Pulse: 84  Weight: 277 lb (125.6 kg)  Body mass index is 43.38 kg/m.        Physical Examination:   General appearance: Well appearing, and in no distress  Mental status: Alert, oriented to person, place, and time  Skin: Warm & dry  Cardiovascular: Normal heart rate noted  Respiratory: Normal respiratory effort, no distress  Abdomen: Soft, gravid, nontender  Pelvic: Cervical exam performed  Dilation: 3 Effacement (%): Thick Station: -3  Extremities: Edema: Trace  Fetal Status: Fetal Heart Rate (bpm): 150 Fundal Height: 38 cm Movement: Present Presentation: Vertex  No results found for this or any previous visit (from the past 24 hour(s)).  Assessment & Plan:  1) Low-risk pregnancy [id number] at [redacted]w[redacted]d with an Estimated Date of Delivery: 09/20/21   2) Hx afib, not needing flecainide, occ palpitations, has f/u cards appt in Oct or sooner prn   Meds: No orders of the defined types were placed in this  encounter.  Labs/procedures today: GBS/cultures; SVE  Plan:  Continue routine obstetrical care   Reviewed: Term labor symptoms and general obstetric precautions including but not limited to vaginal bleeding, contractions, leaking of fluid and fetal movement were reviewed in detail with the patient.  All questions were answered. Has home bp cuff.  Check bp weekly, let us know if >140/90.   Follow-up: Return in about 1 week (around 08/30/2021) for LROB; set up for weekly visits x 3.  Orders Placed This Encounter  Procedures   Culture, beta strep (group b only)   Arabella Merles [facility] 08/23/2021 9:31 AM

## 2021-08-24 LAB — CERVICOVAGINAL ANCILLARY ONLY
Chlamydia: NEGATIVE
Comment: NEGATIVE
Comment: NORMAL
Neisseria Gonorrhea: NEGATIVE

## 2021-08-27 LAB — CULTURE, BETA STREP (GROUP B ONLY): Strep Gp B Culture: NEGATIVE

## 2021-08-28 ENCOUNTER — Encounter (HOSPITAL_COMMUNITY): Payer: Self-pay | Admitting: Family Medicine

## 2021-08-28 ENCOUNTER — Inpatient Hospital Stay (HOSPITAL_COMMUNITY)
Admission: AD | Admit: 2021-08-28 | Discharge: 2021-08-28 | Disposition: A | Discharge status: Home / Self Care | Payer: Commercial Managed Care - PPO | Attending: Family Medicine | Admitting: Family Medicine

## 2021-08-28 DIAGNOSIS — Z3A36 36 weeks gestation of pregnancy: Secondary | ICD-10-CM | POA: Diagnosis not present

## 2021-08-28 DIAGNOSIS — O4703 False labor before 37 completed weeks of gestation, third trimester: Secondary | ICD-10-CM | POA: Diagnosis present

## 2021-08-28 DIAGNOSIS — Z3480 Encounter for supervision of other normal pregnancy, unspecified trimester: Secondary | ICD-10-CM

## 2021-08-28 DIAGNOSIS — Z3689 Encounter for other specified antenatal screening: Secondary | ICD-10-CM

## 2021-08-28 DIAGNOSIS — O4700 False labor before 37 completed weeks of gestation, unspecified trimester: Secondary | ICD-10-CM | POA: Diagnosis not present

## 2021-08-28 NOTE — MAU Note (Signed)
In bathroom

## 2021-08-28 NOTE — MAU Provider Note (Signed)
Patient was assessed for active labor and managed by nursing staff during this encounter. I have reviewed the chart and agree with the documentation and plan. I have also reviewed the NST for appropriate reactivity.  Fetal Tracing: reactive  Baseline: 150 Variability: moderate (had minimal reactivity to start but RN gave pt juice and had her change positions, baby became reactive) Accelerations: 15x15  Decelerations: none Toco: UI, pt endorsed contractions eased considerably since MAU arrival  Cervical exam (unchanged from office x2): Dilation: 4 Effacement (%): 60 Station: -2 Presentation: Vertex Exam by:: DCallaway, RN    Patient stable for discharge home with labor precautions.  Edd Arbour, CNM, MSN, IBCLC Certified Nurse Midwife, [facility] 08/28/21 at 10:09 PM

## 2021-08-28 NOTE — MAU Note (Signed)
Pt says [facility] No VB VE in office 4 cm  Feels UC - have eased up and less freq Denies HSV GBS- neg

## 2021-08-31 ENCOUNTER — Encounter: Payer: Self-pay | Admitting: Cardiology

## 2021-08-31 ENCOUNTER — Encounter: Payer: Self-pay | Admitting: Advanced Practice Midwife

## 2021-08-31 ENCOUNTER — Ambulatory Visit (INDEPENDENT_AMBULATORY_CARE_PROVIDER_SITE_OTHER): Payer: Commercial Managed Care - PPO | Admitting: Advanced Practice Midwife

## 2021-08-31 VITALS — BP 130/84 | HR 80 | Wt 276.6 lb

## 2021-08-31 DIAGNOSIS — Z3483 Encounter for supervision of other normal pregnancy, third trimester: Secondary | ICD-10-CM

## 2021-08-31 DIAGNOSIS — Z3A37 37 weeks gestation of pregnancy: Secondary | ICD-10-CM

## 2021-08-31 NOTE — Telephone Encounter (Signed)
Patient reports that when she changed position in bed, she had a 30-second run of afib. She felt "very weak" at the time. She beared down and self-converted. She did not take medication. She is at ob-gyn now ([redacted] wk gestation). Has an appointment in Oct. with Dr. Servando Salina.

## 2021-08-31 NOTE — Progress Notes (Signed)
   LOW-RISK PREGNANCY VISIT Patient name: Kristi West MRN [id number]  Date of birth: 06-24-1990 Chief Complaint:   Routine Prenatal Visit (Episode of Afib this morning)  History of Present Illness:   Kristi West is a 31 y.o. G24P2012 female at [redacted]w[redacted]d with an Estimated Date of Delivery: 09/20/21 being seen today for ongoing management of a low-risk pregnancy.  Today she reports normal pg complaints  Feels like she went into A fib this am, but only lasted about 30 seconds.  Has had 2 false labor trips to MAU this week.. Contractions: Irregular. Vag. Bleeding: None.  Movement: Present. denies leaking of fluid. Review of Systems:   Pertinent items are noted in HPI Denies abnormal vaginal discharge w/ itching/odor/irritation, headaches, visual changes, shortness of breath, chest pain, abdominal pain, severe nausea/vomiting, or problems with urination or bowel movements unless otherwise stated above. Pertinent History Reviewed:  Reviewed past medical,surgical, social, obstetrical and family history.  Reviewed problem list, medications and allergies. Physical Assessment:   Vitals:   08/31/21 0927 08/31/21 0930  BP: 131/87 130/84  Pulse: 80   Weight: 276 lb 9.6 oz (125.5 kg)   Body mass index is 43.32 kg/m.        Physical Examination:   General appearance: Well appearing, and in no distress  Mental status: Alert, oriented to person, place, and time  Skin: Warm & dry  Cardiovascular: Normal heart rate noted  Respiratory: Normal respiratory effort, no distress  Abdomen: Soft, gravid, nontender  Pelvic: Cervical exam performed  Dilation: 4.5 Effacement (%): 20 Station: -2  Extremities: Edema: Trace  Fetal Status: Fetal Heart Rate (bpm): 147 Fundal Height: 39 cm Movement: Present Presentation: Vertex  Chaperone: Latisha Cresenzo    No results found for this or any previous visit (from the past 24 hour(s)).  Assessment & Plan:  1) Low-risk pregnancy [id number] at [redacted]w[redacted]d with an Estimated Date  of Delivery: 09/20/21      Meds: No orders of the defined types were placed in this encounter.  Labs/procedures today: none  Plan:  Continue routine obstetrical care  Next visit: prefers in person    Reviewed: Term labor symptoms and general obstetric precautions including but not limited to vaginal bleeding, contractions, leaking of fluid and fetal movement were reviewed in detail with the patient.  All questions were answered. Has home bp cuff.. Check bp weekly, let us know if >140/90.   Follow-up: weekly  No orders of the defined types were placed in this encounter.  Jacklyn Shell DNP, CNM 08/31/2021 10:01 AM

## 2021-09-06 ENCOUNTER — Telehealth (HOSPITAL_COMMUNITY): Payer: Self-pay | Admitting: *Deleted

## 2021-09-06 ENCOUNTER — Encounter: Payer: Self-pay | Admitting: Medical

## 2021-09-06 ENCOUNTER — Ambulatory Visit (INDEPENDENT_AMBULATORY_CARE_PROVIDER_SITE_OTHER): Payer: Commercial Managed Care - PPO | Admitting: Medical

## 2021-09-06 ENCOUNTER — Other Ambulatory Visit: Payer: Self-pay | Admitting: Advanced Practice Midwife

## 2021-09-06 ENCOUNTER — Encounter (HOSPITAL_COMMUNITY): Payer: Self-pay | Admitting: *Deleted

## 2021-09-06 VITALS — BP 132/83 | HR 85 | Wt 276.6 lb

## 2021-09-06 DIAGNOSIS — Z3480 Encounter for supervision of other normal pregnancy, unspecified trimester: Secondary | ICD-10-CM

## 2021-09-06 DIAGNOSIS — Z3A38 38 weeks gestation of pregnancy: Secondary | ICD-10-CM

## 2021-09-06 DIAGNOSIS — I4891 Unspecified atrial fibrillation: Secondary | ICD-10-CM

## 2021-09-06 NOTE — Telephone Encounter (Signed)
Preadmission screen  

## 2021-09-06 NOTE — Progress Notes (Signed)
   PRENATAL VISIT NOTE  Subjective:  Kristi West is a 31 y.o. B2I2035 at [redacted]w[redacted]d being seen today for ongoing prenatal care.  She is currently monitored for the following issues for this high-risk pregnancy and has Nystagmus; GAD (generalized anxiety disorder); Vitamin D deficiency; Abnormal Pap smear of cervix; Atrial fibrillation with RVR (HCC); and Encounter for supervision of normal pregnancy, antepartum on their problem list.  Patient reports occasional contractions.  Contractions: Irregular. Vag. Bleeding: None.  Movement: Present. Denies leaking of fluid.   The following portions of the patient's history were reviewed and updated as appropriate: allergies, current medications, past family history, past medical history, past social history, past surgical history and problem list.   Objective:   Vitals:   09/06/21 0920  BP: 132/83  Pulse: 85  Weight: 276 lb 9.6 oz (125.5 kg)    Fetal Status: Fetal Heart Rate (bpm): 140 Fundal Height: 41 cm Movement: Present  Presentation: Vertex  General:  Alert, oriented and cooperative. Patient is in no acute distress.  Skin: Skin is warm and dry. No rash noted.   Cardiovascular: Normal heart rate noted  Respiratory: Normal respiratory effort, no problems with respiration noted  Abdomen: Soft, gravid, appropriate for gestational age.  Pain/Pressure: Present     Pelvic: Cervical exam performed in the presence of a chaperone Dilation: 4.5 Effacement (%): 40 Station: -2  Extremities: Normal range of motion.  Edema: Trace  Mental Status: Normal mood and affect. Normal behavior. Normal judgment and thought content.   Assessment and Plan:  Pregnancy: [id number] at [redacted]w[redacted]d 1. Supervision of other normal pregnancy, antepartum - Doing well, irregular daily contractions  - Desires IOL at 39 weeks   2. Atrial fibrillation with RVR (HCC)  3. [redacted] weeks gestation of pregnancy  Term labor symptoms and general obstetric precautions including but not limited  to vaginal bleeding, contractions, leaking of fluid and fetal movement were reviewed in detail with the patient. Please refer to After Visit Summary for other counseling recommendations.   Return for IOL scheduled at 39 weeks.  Future Appointments  Date Time Provider Department Center  09/13/2021  8:50 AM Arabella Merles, CNM [facility]  09/20/2021  8:30 AM Arabella Merles, CNM [facility]  10/27/2021  4:00 PM Thomasene Ripple, DO [facility] None    Vonzella Nipple, PA-C

## 2021-09-07 ENCOUNTER — Encounter: Payer: Self-pay | Admitting: *Deleted

## 2021-09-07 ENCOUNTER — Inpatient Hospital Stay (HOSPITAL_COMMUNITY)
Admission: AD | Admit: 2021-09-07 | Discharge: 2021-09-08 | DRG: 805 | Disposition: A | Discharge status: Home / Self Care | Payer: Commercial Managed Care - PPO | Attending: Obstetrics and Gynecology | Admitting: Obstetrics and Gynecology

## 2021-09-07 ENCOUNTER — Other Ambulatory Visit: Payer: Self-pay

## 2021-09-07 ENCOUNTER — Encounter (HOSPITAL_COMMUNITY): Payer: Self-pay | Admitting: Obstetrics and Gynecology

## 2021-09-07 DIAGNOSIS — O9942 Diseases of the circulatory system complicating childbirth: Secondary | ICD-10-CM | POA: Diagnosis present

## 2021-09-07 DIAGNOSIS — Z7982 Long term (current) use of aspirin: Secondary | ICD-10-CM

## 2021-09-07 DIAGNOSIS — Z3A39 39 weeks gestation of pregnancy: Principal | ICD-10-CM

## 2021-09-07 DIAGNOSIS — Z3A38 38 weeks gestation of pregnancy: Secondary | ICD-10-CM

## 2021-09-07 DIAGNOSIS — I4891 Unspecified atrial fibrillation: Secondary | ICD-10-CM | POA: Diagnosis present

## 2021-09-07 DIAGNOSIS — Z3480 Encounter for supervision of other normal pregnancy, unspecified trimester: Secondary | ICD-10-CM

## 2021-09-07 DIAGNOSIS — Z87891 Personal history of nicotine dependence: Secondary | ICD-10-CM

## 2021-09-07 DIAGNOSIS — O26893 Other specified pregnancy related conditions, third trimester: Secondary | ICD-10-CM | POA: Diagnosis present

## 2021-09-07 LAB — CBC
HCT: 38.4 % (ref 36.0–46.0)
Hemoglobin: 12.5 g/dL (ref 12.0–15.0)
MCH: 28.9 pg (ref 26.0–34.0)
MCHC: 32.6 g/dL (ref 30.0–36.0)
MCV: 88.9 fL (ref 80.0–100.0)
Platelets: 343 10*3/uL (ref 150–400)
RBC: 4.32 MIL/uL (ref 3.87–5.11)
RDW: 13.7 % (ref 11.5–15.5)
WBC: 9.3 10*3/uL (ref 4.0–10.5)
nRBC: 0 % (ref 0.0–0.2)

## 2021-09-07 LAB — RPR: RPR Ser Ql: NONREACTIVE

## 2021-09-07 LAB — TYPE AND SCREEN
ABO/RH(D): O POS
Antibody Screen: NEGATIVE

## 2021-09-07 MED ORDER — TETANUS-DIPHTH-ACELL PERTUSSIS 5-2.5-18.5 LF-MCG/0.5 IM SUSY: 0.5000 mL | PREFILLED_SYRINGE | Freq: Once | INTRAMUSCULAR | Status: DC

## 2021-09-07 MED ORDER — ZOLPIDEM TARTRATE 5 MG PO TABS: 5.0000 mg | ORAL_TABLET | Freq: Every evening | ORAL | Status: DC | PRN

## 2021-09-07 MED ORDER — OXYTOCIN BOLUS FROM INFUSION
333.0000 mL | Freq: Once | INTRAVENOUS | Status: AC
  Administered 2021-09-07: 333 mL via INTRAVENOUS

## 2021-09-07 MED ORDER — OXYCODONE-ACETAMINOPHEN 5-325 MG PO TABS: 1.0000 | ORAL_TABLET | ORAL | Status: DC | PRN

## 2021-09-07 MED ORDER — LACTATED RINGERS IV SOLN: INTRAVENOUS | Status: DC

## 2021-09-07 MED ORDER — SOD CITRATE-CITRIC ACID 500-334 MG/5ML PO SOLN: 30.0000 mL | ORAL | Status: DC | PRN

## 2021-09-07 MED ORDER — FLECAINIDE ACETATE 100 MG PO TABS: 300.0000 mg | ORAL_TABLET | Freq: Once | ORAL | Status: DC | PRN

## 2021-09-07 MED ORDER — TERBUTALINE SULFATE 1 MG/ML IJ SOLN: 0.2500 mg | Freq: Once | INTRAMUSCULAR | Status: DC | PRN

## 2021-09-07 MED ORDER — PRENATAL MULTIVITAMIN CH
1.0000 | ORAL_TABLET | Freq: Every day | ORAL | Status: DC
  Administered 2021-09-07 – 2021-09-08 (×2): 1 via ORAL
  Filled 2021-09-07 (×2): qty 1

## 2021-09-07 MED ORDER — ACETAMINOPHEN 325 MG PO TABS: 650.0000 mg | ORAL_TABLET | ORAL | Status: DC | PRN

## 2021-09-07 MED ORDER — LIDOCAINE HCL (PF) 1 % IJ SOLN: 30.0000 mL | INTRAMUSCULAR | Status: DC | PRN

## 2021-09-07 MED ORDER — COCONUT OIL OIL: 1.0000 | TOPICAL_OIL | Status: DC | PRN

## 2021-09-07 MED ORDER — MISOPROSTOL 25 MCG QUARTER TABLET: 25.0000 ug | ORAL_TABLET | Freq: Once | ORAL | Status: DC

## 2021-09-07 MED ORDER — SENNOSIDES-DOCUSATE SODIUM 8.6-50 MG PO TABS
2.0000 | ORAL_TABLET | ORAL | Status: DC
  Administered 2021-09-08: 2 via ORAL
  Filled 2021-09-07 (×2): qty 2

## 2021-09-07 MED ORDER — ONDANSETRON HCL 4 MG PO TABS: 4.0000 mg | ORAL_TABLET | ORAL | Status: DC | PRN

## 2021-09-07 MED ORDER — FENTANYL CITRATE (PF) 100 MCG/2ML IJ SOLN: 100.0000 ug | INTRAMUSCULAR | Status: DC | PRN

## 2021-09-07 MED ORDER — OXYCODONE-ACETAMINOPHEN 5-325 MG PO TABS
2.0000 | ORAL_TABLET | ORAL | Status: DC | PRN
  Administered 2021-09-07: 2 via ORAL
  Filled 2021-09-07: qty 2

## 2021-09-07 MED ORDER — DIBUCAINE (PERIANAL) 1 % EX OINT: 1.0000 | TOPICAL_OINTMENT | CUTANEOUS | Status: DC | PRN

## 2021-09-07 MED ORDER — MISOPROSTOL 50MCG HALF TABLET: 50.0000 ug | ORAL_TABLET | Freq: Once | ORAL | Status: DC

## 2021-09-07 MED ORDER — LACTATED RINGERS IV SOLN: 500.0000 mL | INTRAVENOUS | Status: DC | PRN

## 2021-09-07 MED ORDER — DIPHENHYDRAMINE HCL 25 MG PO CAPS: 25.0000 mg | ORAL_CAPSULE | Freq: Four times a day (QID) | ORAL | Status: DC | PRN

## 2021-09-07 MED ORDER — OXYTOCIN-SODIUM CHLORIDE 30-0.9 UT/500ML-% IV SOLN
2.5000 [IU]/h | INTRAVENOUS | Status: DC
  Administered 2021-09-07: 2.5 [IU]/h via INTRAVENOUS
  Filled 2021-09-07: qty 500

## 2021-09-07 MED ORDER — WITCH HAZEL-GLYCERIN EX PADS: 1.0000 | MEDICATED_PAD | CUTANEOUS | Status: DC | PRN

## 2021-09-07 MED ORDER — BENZOCAINE-MENTHOL 20-0.5 % EX AERO: 1.0000 | INHALATION_SPRAY | CUTANEOUS | Status: DC | PRN

## 2021-09-07 MED ORDER — ONDANSETRON HCL 4 MG/2ML IJ SOLN: 4.0000 mg | INTRAMUSCULAR | Status: DC | PRN

## 2021-09-07 MED ORDER — OXYCODONE HCL 5 MG PO TABS: 5.0000 mg | ORAL_TABLET | ORAL | Status: DC | PRN

## 2021-09-07 MED ORDER — SIMETHICONE 80 MG PO CHEW: 80.0000 mg | CHEWABLE_TABLET | ORAL | Status: DC | PRN

## 2021-09-07 MED ORDER — ONDANSETRON HCL 4 MG/2ML IJ SOLN: 4.0000 mg | Freq: Four times a day (QID) | INTRAMUSCULAR | Status: DC | PRN

## 2021-09-07 MED ORDER — IBUPROFEN 600 MG PO TABS
600.0000 mg | ORAL_TABLET | Freq: Four times a day (QID) | ORAL | Status: DC
  Administered 2021-09-07 – 2021-09-08 (×5): 600 mg via ORAL
  Filled 2021-09-07 (×5): qty 1

## 2021-09-07 MED ORDER — MEASLES, MUMPS & RUBELLA VAC IJ SOLR: 0.5000 mL | Freq: Once | INTRAMUSCULAR | Status: DC

## 2021-09-07 NOTE — Discharge Summary (Signed)
Postpartum Discharge Summary  Date of Service updated 09/08/21     Patient Name: Kristi West DOB: 1990/08/27 MRN: [id number]  Date of admission: 09/07/2021 Delivery date:09/07/2021  Delivering provider: Serita Grammes D  Date of discharge: 09/08/2021  Admitting diagnosis: [redacted] weeks gestation of pregnancy [Z3A.39] Intrauterine pregnancy: [redacted]w[redacted]d     Secondary diagnosis:  Active Problems:   Atrial fibrillation with RVR ([place])   Precipitous delivery, delivered (current hospitalization)  Additional problems: none    Discharge diagnosis: Term Pregnancy Delivered                                              Post partum procedures: none Augmentation:  none Complications: None  Hospital course: Onset of Labor With Vaginal Delivery      31 y.o. yo [id number] at [redacted]w[redacted]d was admitted in Active Labor/transition on 09/07/2021. Patient had an uncomplicated labor course as follows:  Membrane Rupture Time/Date: 3:29 AM ,09/07/2021   Delivery Method:Vaginal, Spontaneous  Episiotomy: None  Lacerations:  None  Patient had an uncomplicated postpartum course.  She is ambulating, tolerating a regular diet, passing flatus, and urinating well. Patient is discharged home in stable condition on 09/08/21.  Newborn Data: Birth date:09/07/2021  Birth time:3:29 AM  Gender:Female  Living status:Living  Apgars:8 ,9  Weight:3220 g (7lb 1.6oz)  Magnesium Sulfate received: No BMZ received: No Rhophylac:N/A MMR:N/A T-DaP: offered postpartum Flu: No Transfusion:No  Physical exam  Vitals:   09/07/21 1150 09/07/21 1700 09/07/21 2100 09/08/21 0615  BP: 133/68 112/76 103/61 120/73  Pulse: 68 75 76 64  Resp: $Remo'17 18 16   'vXibt$ Temp: 98.2 F (36.8 C) 98 F (36.7 C) 98.5 F (36.9 C) 98.6 F (37 C)  TempSrc: Oral Oral Oral Oral  SpO2: 98% 97% 98% 98%   General: alert, cooperative, and no distress Lochia: appropriate Uterine Fundus: firm Incision: N/A DVT Evaluation: No evidence of DVT seen on physical  exam. Labs: Lab Results  Component Value Date   WBC 9.3 09/07/2021   HGB 12.5 09/07/2021   HCT 38.4 09/07/2021   MCV 88.9 09/07/2021   PLT 343 09/07/2021      Latest Ref Rng & Units 12/03/2014    3:34 PM  CMP  Glucose 65 - 99 mg/dL 66   BUN 6 - 20 mg/dL 15   Creatinine 0.57 - 1.00 mg/dL 0.72   Sodium 136 - 144 mmol/L 141   Potassium 3.5 - 5.2 mmol/L 4.4   Chloride 97 - 106 mmol/L 100   CO2 18 - 29 mmol/L 25   Calcium 8.7 - 10.2 mg/dL 9.1   Total Protein 6.0 - 8.5 g/dL 7.1   Total Bilirubin 0.0 - 1.2 mg/dL 0.3   Alkaline Phos 39 - 117 IU/L 105   AST 0 - 40 IU/L 19   ALT 0 - 32 IU/L 17    Edinburgh Score:    09/07/2021    5:46 AM  Edinburgh Postnatal Depression Scale Screening Tool  I have been able to laugh and see the funny side of things. 0  I have looked forward with enjoyment to things. 0  I have blamed myself unnecessarily when things went wrong. 1  I have been anxious or worried for no good reason. 2  I have felt scared or panicky for no good reason. 1  Things have been getting on top of me.  2  I have been so unhappy that I have had difficulty sleeping. 0  I have felt sad or miserable. 1  I have been so unhappy that I have been crying. 0  The thought of harming myself has occurred to me. 0  Edinburgh Postnatal Depression Scale Total 7     After visit meds:  Allergies as of 09/08/2021       Reactions   Ceclor [cefaclor] Hives   Other Itching, Swelling   Fresh fruits cause the patient's mouth and throat to itch and swell.   Peanut-containing Drug Products Itching, Swelling   All types of nuts cause the patient's mouth and throat to swell and itch.        Medication List     STOP taking these medications    aspirin EC 81 MG tablet   flecainide 100 MG tablet Commonly known as: TAMBOCOR   multivitamin-prenatal 27-0.8 MG Tabs tablet       TAKE these medications    acetaminophen 325 MG tablet Commonly known as: Tylenol Take 2 tablets (650 mg  total) by mouth every 4 (four) hours as needed (for pain scale < 4).   ibuprofen 600 MG tablet Commonly known as: ADVIL Take 1 tablet (600 mg total) by mouth every 6 (six) hours.   senna-docusate 8.6-50 MG tablet Commonly known as: Senokot-S Take 2 tablets by mouth daily. Start taking on: September 09, 2021         Discharge home in stable condition Infant Feeding: Breast Infant Disposition:home with mother Discharge instruction: per After Visit Summary and Postpartum booklet. Activity: Advance as tolerated. Pelvic rest for 6 weeks.  Diet: routine diet Future Appointments: Future Appointments  Date Time Provider [place]  10/18/2021  1:30 PM Myrtis Ser, CNM [facility]  10/27/2021  4:00 PM Tobb, Godfrey Pick, DO [facility] None   Follow up Visit:  Myrtis Ser, CNM  Meredeth Ide R Please schedule this patient for Postpartum visit in: 6 weeks with the following provider: Any provider  In-Person  For C/S patients schedule nurse incision check in weeks 2 weeks: no  Low risk pregnancy complicated by: none  Delivery mode:  SVD  Anticipated Birth Control:  IUD (at office)  PP Procedures needed: none  Schedule Integrated [place] visit: no   09/08/2021 Salvadore Oxford, MD  Attestation of Supervision of Student:  I confirm that I have verified the information documented in the  resident  student's note and that I have also personally reperformed the history, physical exam and all medical decision making activities.  I have verified that all services and findings are accurately documented in this student's note; and I agree with management and plan as outlined in the documentation. I have also made any necessary editorial changes.   Starr Lake, [place] for [employer] [employer] 09/08/2021 3:03 PM

## 2021-09-07 NOTE — Telephone Encounter (Signed)
Pt delivered at 0329 today using Nitrous. Plans that time was to be discharge.

## 2021-09-07 NOTE — Telephone Encounter (Unsigned)
According to medical record, patient admitted this AM to L&D.

## 2021-09-07 NOTE — Progress Notes (Signed)
Contacted lactation and they will be down. Patient attempting and baby attempting to latch Showing signs of hunger

## 2021-09-07 NOTE — Lactation Note (Signed)
This note was copied from a baby's chart. Lactation Consultation Note  Patient Name: Girl Trany Chernick [id number] Date: 09/07/2021 Reason for consult: Follow-up assessment Age:30 hours  P3, Baby sleeping skin to skin on mother's (agreed term) chest.  Reviewed basics.  Suggest calling for help with latch as needed. Feed on demand with cues.  Goal 8-12+ times per day after first 24 hrs.  Place baby STS if not cueing.  Mom made aware of O/P services, breastfeeding support group and our phone # for post-discharge questions.    Maternal Data Has patient been taught Hand Expression?: Yes Does the patient have breastfeeding experience prior to this delivery?: No (Diffiuclty latching other childtren)  Feeding Mother's Current Feeding Choice: Breast Milk  Interventions Interventions: Breast feeding basics reviewed;Education;LC Services brochure   Consult Status Consult Status: Follow-up Date: 09/08/21 Follow-up type: In-patient    Dahlia Byes [facility] 09/07/2021, 9:14 AM

## 2021-09-07 NOTE — H&P (Signed)
Kristi West is a 31 y.o. [phone] female at [redacted]w[redacted]d by LMP c/w 10wk scan presenting for active labor.   Reports active fetal movement, contractions: regular, every 2-3 minutes since 0110, vaginal bleeding: scant staining, membranes: intact.  Initiated prenatal care at [facility] at 12wks wks.   Most recent u/s : anatomy scan at [redacted]w[redacted]d, EFW 394g, nl fluid.   This pregnancy complicated by: # hx atrial fib with 2017 preg @ 36wks (converted w flecanide)  Prenatal History/Complications:  # SVB x 2 (2014 & 2017)  Past Medical History: Past Medical History:  Diagnosis Date   A-fib (HCC)    Abnormal Pap smear    Albinism (HCC)    Anemia    Anxiety    Atrial fibrillation, unspecified    Heart murmur    Pregnant 07/07/2012   Seizures (HCC)    x1 as a child   Vaginal Pap smear, abnormal     Past Surgical History: Past Surgical History:  Procedure Laterality Date   TONSILLECTOMY     WISDOM TOOTH EXTRACTION      Obstetrical History: OB History     Gravida  4   Para  2   Term  2   Preterm      AB  1   Living  2      SAB      IAB      Ectopic      Multiple  0   Live Births  2           Social History: Social History   Socioeconomic History   Marital status: Unknown    Spouse name: Not on file   Number of children: 2   Years of education: Not on file   Highest education level: Not on file  Occupational History   Not on file  Tobacco Use   Smoking status: Former    Years: 0.50    Types: Cigarettes   Smokeless tobacco: Never  Vaping Use   Vaping Use: Never used  Substance and Sexual Activity   Alcohol use: Not Currently    Comment: occa   Drug use: No   Sexual activity: Yes    Birth control/protection: Condom  Other Topics Concern   Not on file  Social History Narrative   Not on file   Social Determinants of Health   Financial Resource Strain: Low Risk  (06/22/2021)   Overall Financial Resource Strain (CARDIA)    Difficulty of Paying Living  Expenses: Not hard at all  Food Insecurity: No Food Insecurity (06/22/2021)   Hunger Vital Sign    Worried About Running Out of Food in the Last Year: Never true    Ran Out of Food in the Last Year: Never true  Transportation Needs: No Transportation Needs (06/22/2021)   PRAPARE - Administrator, Civil Service (Medical): No    Lack of Transportation (Non-Medical): No  Physical Activity: Insufficiently Active (06/22/2021)   Exercise Vital Sign    Days of Exercise per Week: 2 days    Minutes of Exercise per Session: 10 min  Stress: Stress Concern Present (06/22/2021)   [employer] of Occupational Health - Occupational Stress Questionnaire    Feeling of Stress : To some extent  Social Connections: Moderately Integrated (06/22/2021)   Social Connection and Isolation Panel [NHANES]    Frequency of Communication with Friends and Family: More than three times a week    Frequency of Social Gatherings with Friends and Family:  Once a week    Attends Religious Services: More than 4 times per year    Active Member of Clubs or Organizations: No    Attends Engineer, structural: More than 4 times per year    Marital Status: Separated    Family History: Family History  Problem Relation Age of Onset   SIDS Brother    Cancer Maternal Aunt        stomach   Cancer Maternal Grandmother        leukemia   Diabetes Paternal Grandmother    Stroke Paternal Grandfather     Allergies: Allergies  Allergen Reactions   Ceclor [Cefaclor] Hives   Other Itching and Swelling    Fresh fruits cause the patient's mouth and throat to itch and swell.   Peanut-Containing Drug Products Itching and Swelling    All types of nuts cause the patient's mouth and throat to swell and itch.    Medications Prior to Admission  Medication Sig Dispense Refill Last Dose   aspirin 81 MG EC tablet Take 2 tablets (162 mg total) by mouth daily. Swallow whole. 90 tablet 3    flecainide (TAMBOCOR) 100 MG tablet  Take 1 orally if atrial fibrillation persists.  Repeat in 1 hour if persists.  May repeat 1 more time in 4 hours if needed.  If persists beyond this, visit Emergency Department (Patient not taking: Reported on 08/03/2021) 30 tablet 1    Prenatal Vit-Fe Fumarate-FA (MULTIVITAMIN-PRENATAL) 27-0.8 MG TABS tablet Take 1 tablet by mouth daily at 12 noon.       Review of Systems  Pertinent pos/neg as indicated in HPI  Last menstrual period 12/14/2020. General appearance: distracted and moderate distress Lungs: clear to auscultation bilaterally Heart: regular rate and rhythm Abdomen: gravid, soft, non-tender, EFW by Leopold's approximately 6-7lbs Extremities: trace edema  Fetal monitoring: FHR: 140s bpm, variability: moderate,  Accelerations: some 10x10 ,  decelerations:  Present  mild variables Uterine activity: Frequency: Every 2-3 minutes   Presentation: cephalic   Prenatal labs: ABO, Rh: O/Positive/-- (02/16 1104) Antibody: Negative (06/01 0841) Rubella: 5.57 (02/16 1104) RPR: Non Reactive (06/01 0841)  HBsAg: Negative (02/16 1104)  HIV: Non Reactive (06/01 0841)  GBS: Negative/-- (08/02 1330)  2hr GTT: 77/91/98  Prenatal Transfer Tool  Maternal Diabetes: No Genetic Screening: Normal Maternal Ultrasounds/Referrals: Normal Fetal Ultrasounds or other Referrals:  None Maternal Substance Abuse:  No Significant Maternal Medications:  None Significant Maternal Lab Results: Group B Strep negative  No results found for this or any previous visit (from the past 24 hour(s)).   Assessment:  [redacted]w[redacted]d SIUP  [id number]  Active labor/transition  FHR stable  GBS Negative/-- (08/02 1330)  Plan:  Admit to L&D  IV pain meds/epidural prn active labor  Expectant management  Anticipate vag delivery   Plans to breastfeed  Contraception: IUD @ pp visit   Arabella Merles CNM 09/07/2021, 3:03 AM

## 2021-09-07 NOTE — MAU Note (Signed)
Patient access called to notify about patient in intense pain. Pt pulled straight back from lobby to a room. Pt assisted to bed and dressed in gown. EFM placed, FHR 140.  Mau Provider, Wynelle Bourgeois performed SVE @ 0300, 6.5/80/Ballotable Vertex. RN attempted to triage patient, patient unable to answer due to CTX. Pt reports needing pain medications, and RN and Mau provider explained need for IV access and labwork for epidural. RN obtained IV access and labwork sent @ 0308. Vitals signs obtained @ 0314.Marland Kitchen CNM called L/D 1st call Provider for orders and L/D Charge nurse for bed placement. Pt transport to L/D.

## 2021-09-07 NOTE — Lactation Note (Signed)
This note was copied from a baby's chart. Lactation Consultation Note  Patient Name: Kristi West Today's Date: 09/07/2021 Reason for consult: L&D Initial assessment;Early term 37-38.6wks Age:30 hours   Initial L&D Consult:  Visited with family > 1 hour after birth Assisted to latch easily; observed baby feeding for 5 minutes before leaving the room.  Reassured family that lactation services will be available on the M/B unit.  Allowed time for family bonding.  Support person at bedside observing breast feeding.   Maternal Data    Feeding Mother's Current Feeding Choice: Breast Milk  LATCH Score Latch: Repeated attempts needed to sustain latch, nipple held in mouth throughout feeding, stimulation needed to elicit sucking reflex.  Audible Swallowing: None  Type of Nipple: Everted at rest and after stimulation  Comfort (Breast/Nipple): Soft / non-tender  Hold (Positioning): Assistance needed to correctly position infant at breast and maintain latch.  LATCH Score: 6   Lactation Tools Discussed/Used    Interventions Interventions: Assisted with latch;Skin to skin  Discharge    Consult Status Consult Status: Follow-up from L&D    Oziah Vitanza R Biridiana Twardowski 09/07/2021, 4:43 AM

## 2021-09-08 MED ORDER — ACETAMINOPHEN 325 MG PO TABS: 650.0000 mg | ORAL_TABLET | ORAL | Status: DC | PRN

## 2021-09-08 MED ORDER — IBUPROFEN 600 MG PO TABS: 600.0000 mg | ORAL_TABLET | Freq: Four times a day (QID) | ORAL | 0 refills | Status: DC

## 2021-09-08 MED ORDER — SENNOSIDES-DOCUSATE SODIUM 8.6-50 MG PO TABS: 2.0000 | ORAL_TABLET | ORAL | Status: DC

## 2021-09-08 NOTE — Social Work (Signed)
CSW received consult for hx of Anxiety and Depression.  CSW met with MOB to offer support and complete assessment. CSW entered the room, introduced self, CSW role and reason for visit. MOB was agreeable to visit. CSW offered privacy, MOB allowed FOB to remain in the room. CSW inquired about how MOB was feeling, MOB reported she was good, although the delivery was not what she expected. CSW voiced concern for her delivery process. CSW inquired about MOB's MH history. MOB reported she was diagnosed with depression in 2016 and was having Panic Attacks. She was prescribed Nirvavam at the time but has since stopped taking it because the medication was making her sick. CSW reported any other treatment. MOB reported none at this time. CSW inquired about her MOB copes with anxiety symptoms. MOB stated "prayer, my faith, talk to friends" . CSW encouraged MOB to monitor herself postpartum and to reach out to her provider if needs arise. MOB was agreeable. CSW assessed for safety, MOB denied any SI of HI. MOB reported FOB and her mom are her supports.   CSW provided education regarding the baby blues period vs. perinatal mood disorders, discussed treatment and gave resources for mental health follow up if concerns arise.  CSW recommends self-evaluation during the postpartum time period using the New Mom Checklist from Postpartum Progress and encouraged MOB to contact a medical professional if symptoms are noted at any time.    CSW provided review of Sudden Infant Death Syndrome (SIDS) precautions. MOB identified [facility] for infants follow up care. MOB reported they have all necessary items for the infant including a bassinet for safe sleep.   CSW identifies no further need for intervention and no barriers to discharge at this time.   Letta Kocher, [place] Social Worker [phone]

## 2021-09-08 NOTE — Lactation Note (Signed)
This note was copied from a baby's chart. Lactation Consultation Note  Patient Name: Kristi West Date: 09/08/2021 Reason for consult: Follow-up assessment Age:30 hours  P3, Reviewed positioning and depth.  Baby sleepy at the breast.  Reviewed engorgement care and monitoring voids/stools. Discussed post pumping at home if baby is sleepy at the breast.  Answered questions.   Maternal Data Has patient been taught Hand Expression?: Yes  Feeding Mother's Current Feeding Choice: Breast Milk   Interventions Interventions: Education;Assisted with latch  Discharge Discharge Education: Engorgement and breast care;Warning signs for feeding baby  Consult Status Consult Status: Complete Date: 09/08/21    Dahlia Byes [facility] 09/08/2021, 12:52 PM

## 2021-09-13 ENCOUNTER — Inpatient Hospital Stay (HOSPITAL_COMMUNITY)
Admission: AD | Admit: 2021-09-13 | Payer: Commercial Managed Care - PPO | Source: Home / Self Care | Admitting: Obstetrics and Gynecology

## 2021-09-13 ENCOUNTER — Encounter: Payer: Commercial Managed Care - PPO | Admitting: Advanced Practice Midwife

## 2021-09-13 ENCOUNTER — Inpatient Hospital Stay (HOSPITAL_COMMUNITY): Payer: Commercial Managed Care - PPO

## 2021-09-14 DIAGNOSIS — Z029 Encounter for administrative examinations, unspecified: Secondary | ICD-10-CM

## 2021-09-18 ENCOUNTER — Telehealth (HOSPITAL_COMMUNITY): Payer: Self-pay

## 2021-09-18 NOTE — Telephone Encounter (Signed)
Patient did not answer phone call. Voicemail left for patient.   Kristi West [facility] 09/18/2021,[id number]

## 2021-09-20 ENCOUNTER — Encounter: Payer: Commercial Managed Care - PPO | Admitting: Advanced Practice Midwife

## 2021-10-18 ENCOUNTER — Ambulatory Visit (INDEPENDENT_AMBULATORY_CARE_PROVIDER_SITE_OTHER): Payer: Commercial Managed Care - PPO | Admitting: Advanced Practice Midwife

## 2021-10-18 ENCOUNTER — Encounter: Payer: Self-pay | Admitting: Advanced Practice Midwife

## 2021-10-18 DIAGNOSIS — O165 Unspecified maternal hypertension, complicating the puerperium: Secondary | ICD-10-CM

## 2021-10-18 MED ORDER — NIFEDIPINE ER OSMOTIC RELEASE 30 MG PO TB24: 30.0000 mg | ORAL_TABLET | Freq: Every day | ORAL | 2 refills | Status: DC

## 2021-10-18 NOTE — Progress Notes (Signed)
POSTPARTUM VISIT Patient name: Kristi West MRN [id number]  Date of birth: [birth year] Chief Complaint:   Postpartum Care  History of Present Illness:   Kristi West is a 31 y.o. [phone] female being seen today for a postpartum visit. She is 6 weeks postpartum following a spontaneous vaginal delivery at 38.1 gestational weeks. IOL: no;  Anesthesia:  nitrous .  Laceration: none.  Complications: none. Inpatient contraception: no.   Pregnancy complicated by occ a-fib (didn't occur regularly) . Tobacco use: former . Substance use disorder: no. Last pap smear: Dec 2020 and results were NILM w/ HRHPV negative. Next pap smear due: Dec 2023 Patient's last menstrual period was 10/16/2021 (approximate).  Postpartum course has been complicated by more frequent a-fib episodes- 3x/wk, lasting approx 2hrs, nervous to take Flecainide . Bleeding  bleeding got lighter then picked up again- possibly first cycle? . Bowel function is normal. Bladder function is normal. Urinary incontinence? no, fecal incontinence? no Patient is sexually active. Last sexual activity:  Sept 20th . Desired contraception: IUD. Patient does not know about a pregnancy in the future.  Desired family size is unsure number of children.   Upstream - 10/18/21 1402       Pregnancy Intention Screening   Does the patient want to become pregnant in the next year? No    Does the patient's partner want to become pregnant in the next year? No    Would the patient like to discuss contraceptive options today? Yes      Contraception Wrap Up   Current Method No Contraceptive Precautions    Contraception Counseling Provided Yes            The pregnancy intention screening data noted above was reviewed. Potential methods of contraception were discussed. The patient elected to proceed with No data recorded.  Edinburgh Postpartum Depression Screening: positive: H/O mental health disorder: yes , anxiety. Currently on meds: no.  Currently in  therapy: no.  Sleeping: as expected w newborn.  Appetite: nl.  Still finds joy in things she used to: Yes.  Support at home: yes.  SI/HI/II: no.  Interested in medicine: No.  Interested in therapy: No.  Edinburgh Postnatal Depression Scale - 10/18/21 1355       Edinburgh Postnatal Depression Scale:  In the Past 7 Days   I have been able to laugh and see the funny side of things. 0    I have looked forward with enjoyment to things. 0    I have blamed myself unnecessarily when things went wrong. 1    I have been anxious or worried for no good reason. 2    I have felt scared or panicky for no good reason. 2    Things have been getting on top of me. 1    I have been so unhappy that I have had difficulty sleeping. 0    I have felt sad or miserable. 1    I have been so unhappy that I have been crying. 1    The thought of harming myself has occurred to me. 0    Edinburgh Postnatal Depression Scale Total 8                06/22/2021    9:20 AM 06/05/2021    8:54 AM 03/09/2021    9:57 AM  GAD 7 : Generalized Anxiety Score  Nervous, Anxious, on Edge 2 2 2   Control/stop worrying 2 1 1   Worry too much - different things  $'2 1 1  'a$ Trouble relaxing 1 0 1  Restless 0 0 0  Easily annoyed or irritable $RemoveBefo'1 1 1  'UBVqBXAgBbO$ Afraid - awful might happen $RemoveBefo'2 1 2  'dTqNGFljcIe$ Total GAD 7 Score $Remov'10 6 8     'ZbyrLc$ Baby's course has been uncomplicated. Baby is feeding by breast: milk supply adequate. Infant has a pediatrician/family doctor? Yes.  Childcare strategy if returning to work/school: family.  Pt has material needs met for her and baby: Yes.   Review of Systems:   Pertinent items are noted in HPI Denies Abnormal vaginal discharge w/ itching/odor/irritation, headaches, visual changes, shortness of breath, chest pain, abdominal pain, severe nausea/vomiting, or problems with urination or bowel movements. Pertinent History Reviewed:  Reviewed past medical,surgical, obstetrical and family history.  Reviewed problem list, medications and  allergies. OB History  Gravida Para Term Preterm AB Living  $Remov'4 3 3   1 3  'DNBfhJ$ SAB IAB Ectopic Multiple Live Births        0 3    # Outcome Date GA Lbr Len/2nd Weight Sex Delivery Anes PTL Lv  4 Term 09/07/21 [redacted]w[redacted]d / 00:02 7 lb 1.6 oz (3.22 kg) F Vag-Spont None  LIV  3 Term 08/31/15 [redacted]w[redacted]d 02:14 / 00:40 6 lb 7.5 oz (2.935 kg) F Vag-Spont EPI N LIV  2 Term 12/25/12 [redacted]w[redacted]d 03:48 / 00:47 7 lb 0.5 oz (3.189 kg) M Vag-Spont EPI N LIV  1 AB            Physical Assessment:   Vitals:   10/18/21 1353  BP: (!) 151/104  Pulse: 68  Weight: 254 lb 2 oz (115.3 kg)  Height: $Remove'5\' 7"'zwZgsjy$  (1.702 m)  Body mass index is 39.8 kg/m. Repeat BP: 147/100      Physical Examination:   General appearance: alert, well appearing, and in no distress  Mental status: alert, oriented to person, place, and time  Skin: warm & dry   Cardiovascular: normal heart rate noted   Respiratory: normal respiratory effort, no distress   Breasts: deferred, no complaints   Abdomen: soft, non-tender   Pelvic: examination not indicated. Thin prep pap obtained: No  Rectal: not examined  Extremities: Edema: Trace         No results found for this or any previous visit (from the past 24 hour(s)).  Assessment & Plan:  1) Postpartum exam 2) Six wks s/p spontaneous vaginal delivery 3)  Pumping/breast  feeding 4) Depression screening: mild- feels well and declines meds/therapy at present 5) Contraception management: interested in IUD; had sex 9/20> no sex x 1wk then come back for IUD placement (likely Mirena, maybe Paragard) 6) PP htn diagnosed today: started on ProcardiaXL $RemoveBefore'30mg'dHkvugKMrAdPg$  with recheck in 1wk 7) More frequent bouts of Afib: had cards appt 10/6; nervous to take Flecainide, encouraged to do so 8) Due for Pap 12/23: will get with IUD insertion  Essential components of care per ACOG recommendations:  1.  Mood and well being:  If positive depression screen, discussed and plan developed.  If using tobacco we discussed  reduction/cessation and risk of relapse If current substance abuse, we discussed and referral to local resources was offered.   2. Infant care and feeding:  If breastfeeding, discussed returning to work, pumping, breastfeeding-associated pain, guidance regarding return to fertility while lactating if not using another method. If needed, patient was provided with a letter to be allowed to pump q 2-3hrs to support lactation in a private location with access to a refrigerator to store breastmilk.  Recommended that all caregivers be immunized for flu, pertussis and other preventable communicable diseases If pt does not have material needs met for her/baby, referred to local resources for help obtaining these.  3. Sexuality, contraception and birth spacing Provided guidance regarding sexuality, management of dyspareunia, and resumption of intercourse Discussed avoiding interpregnancy interval <60mths and recommended birth spacing of 18 months  4. Sleep and fatigue Discussed coping options for fatigue and sleep disruption Encouraged family/partner/community support of 4 hrs of uninterrupted sleep to help with mood and fatigue  5. Physical recovery  If pt had a C/S, assessed incisional pain and providing guidance on normal vs prolonged recovery If pt had a laceration, perineal healing and pain reviewed.  If urinary or fecal incontinence, discussed management and referred to PT or uro/gyn if indicated  Patient is safe to resume physical activity. Discussed attainment of healthy weight.  6.  Chronic disease management Discussed pregnancy complications if any, and their implications for future childbearing and long-term maternal health. Review recommendations for prevention of recurrent pregnancy complications, such as 17 hydroxyprogesterone caproate to reduce risk for recurrent PTB not applicable, or aspirin to reduce risk of preeclampsia yes. Pt had GDM: no. If yes, 2hr GTT scheduled: not  applicable. Reviewed medications and non-pregnant dosing including consideration of whether pt is breastfeeding using a reliable resource such as LactMed: yes Referred for f/u w/ PCP or subspecialist providers as indicated: not applicable  7. Health maintenance Mammogram at 30yo or earlier if indicated Pap smears as indicated  Meds:  Meds ordered this encounter  Medications   NIFEdipine (PROCARDIA XL) 30 MG 24 hr tablet    Sig: Take 1 tablet (30 mg total) by mouth daily.    Dispense:  30 tablet    Refill:  2    Order Specific Question:   Supervising Provider    Answer:   Janyth Pupa [[id number]]    Follow-up: Return for 10/4 or soon after for IUD insertion/BP check/Pap.   No orders of the defined types were placed in this encounter.   Myrtis Ser CNM 10/18/2021 4:33 PM

## 2021-10-25 ENCOUNTER — Encounter: Payer: Self-pay | Admitting: Advanced Practice Midwife

## 2021-10-25 ENCOUNTER — Ambulatory Visit (INDEPENDENT_AMBULATORY_CARE_PROVIDER_SITE_OTHER): Payer: Commercial Managed Care - PPO | Admitting: Advanced Practice Midwife

## 2021-10-25 ENCOUNTER — Other Ambulatory Visit (HOSPITAL_COMMUNITY)
Admission: RE | Admit: 2021-10-25 | Discharge: 2021-10-25 | Disposition: A | Discharge status: Home / Self Care | Payer: Commercial Managed Care - PPO | Source: Ambulatory Visit | Attending: Advanced Practice Midwife | Admitting: Advanced Practice Midwife

## 2021-10-25 VITALS — BP 156/104 | HR 58 | Ht 67.0 in | Wt 254.0 lb

## 2021-10-25 DIAGNOSIS — Z30430 Encounter for insertion of intrauterine contraceptive device: Secondary | ICD-10-CM

## 2021-10-25 DIAGNOSIS — Z01419 Encounter for gynecological examination (general) (routine) without abnormal findings: Secondary | ICD-10-CM | POA: Diagnosis present

## 2021-10-25 DIAGNOSIS — Z3202 Encounter for pregnancy test, result negative: Secondary | ICD-10-CM

## 2021-10-25 LAB — POCT URINE PREGNANCY: Preg Test, Ur: NEGATIVE

## 2021-10-25 MED ORDER — LEVONORGESTREL 20.1 MCG/DAY IU IUD: 1.0000 | INTRAUTERINE_SYSTEM | Freq: Once | INTRAUTERINE | Status: DC

## 2021-10-25 MED ORDER — NIFEDIPINE ER OSMOTIC RELEASE 30 MG PO TB24: 30.0000 mg | ORAL_TABLET | Freq: Every day | ORAL | 2 refills | Status: DC

## 2021-10-25 MED ORDER — LEVONORGESTREL 20 MCG/DAY IU IUD
1.0000 | INTRAUTERINE_SYSTEM | Freq: Once | INTRAUTERINE | Status: AC
  Administered 2021-10-25: 1 via INTRAUTERINE

## 2021-10-25 NOTE — Progress Notes (Addendum)
IUD INSERTION Patient name: Kristi West MRN [id number]  Date of birth: [birth year] Subjective Findings:   Kristi West is a 31 y.o. [phone]  female being seen today for insertion of a Mirena IUD; annual exam, Pap, and blood pressure check.     06/22/2021    9:19 AM 06/05/2021    8:50 AM 03/09/2021    9:57 AM 11/18/2017    5:41 PM 06/24/2017    4:29 PM  Depression screen PHQ 2/9  Decreased Interest 1 1 1  0 0  Down, Depressed, Hopeless 1 1 1  0 0  PHQ - 2 Score 2 2 2  0 0  Altered sleeping 1 2 0    Tired, decreased energy 1 2 2     Change in appetite 1 0 1    Feeling bad or failure about yourself  0 0 1    Trouble concentrating 1 0 0    Moving slowly or fidgety/restless 0 0 0    Suicidal thoughts 0 0 0    PHQ-9 Score 6 6 6       Patient's last menstrual period was 10/16/2021 (approximate). Last sexual intercourse was Sept 20th Last pap Dec 2020. Results were:  normal   WELL-WOMAN EXAMINATION       Physical Examination:   General appearance - well appearing, and in no distress  Mental status - alert, oriented to person, place, and time  Psych:  She has a normal mood and affect  Skin - warm and dry, normal color, no suspicious lesions noted  Chest - effort normal, all lung fields clear to auscultation bilaterally  Heart - normal rate and regular rhythm  Neck:  midline trachea, no thyromegaly or nodules  Breasts - breasts appear normal, no suspicious masses, no skin or nipple changes or  axillary nodes  Abdomen - soft, nontender, nondistended, no masses or organomegaly; C/S incision well-healed without s/s infection  Pelvic - VULVA: normal appearing vulva with no masses, tenderness or lesions  VAGINA: normal appearing vagina with normal color and discharge, no lesions  CERVIX: normal appearing cervix without discharge or lesions, no CMT  Thin prep pap is done with HR HPV cotesting  UTERUS: uterus is felt to be normal size, shape, consistency and nontender   ADNEXA: No adnexal  masses or tenderness noted.  Rectal - not examined  Extremities:  No swelling or varicosities noted  The risks and benefits of the method and placement have been thouroughly reviewed with the patient and all questions were answered.  Specifically the patient is aware of failure rate of 01/998, expulsion of the IUD and of possible perforation.  The patient is aware of irregular bleeding due to the method and understands the incidence of irregular bleeding diminishes with time.  Signed copy of informed consent in chart.  Pertinent History Reviewed:   Reviewed past medical,surgical, social, obstetrical and family history.  Reviewed problem list, medications and allergies. Objective Findings & Procedure:   Vitals:   10/25/21 1051  BP: (!) 156/104  Pulse: (!) 58  Weight: 254 lb (115.2 kg)  Height: 5\' 7"  (1.702 m)  Body mass index is 39.78 kg/m.  Results for orders placed or performed in visit on 10/25/21 (from the past 24 hour(s))  POCT urine pregnancy   Collection Time: 10/25/21 10:55 AM  Result Value Ref Range   Preg Test, Ur Negative Negative     Time out was performed.  A Graves speculum was placed in the vagina.  The cervix was visualized,  prepped using Betadine, and grasped with a single tooth tenaculum. The uterus was found to be neutral and it sounded to 7 cm.  Mirena  IUD placed per manufacturer's recommendations. The strings were trimmed to approximately 3 cm. The patient tolerated the procedure well.   Informal transvaginal sonogram was performed and the proper placement of the IUD was verified.  Chaperone: Market researcher & Plan:   1) Mirena IUD insertion The patient was given post procedure instructions, including signs and symptoms of infection and to check for the strings after each menses or each month, and refraining from intercourse or anything in the vagina for 3 days. She was given a care card with date IUD placed, and date IUD to be removed.  2)  Pap due, collected prior to IUD insertion  3) PP hypertension v cHTN, didn't pick up ProcardiaXL 30mg  and still w mild/severe range BP elevations> will start today; has appt w Dr Harriet Masson 10/27/21 for a-fib f/u   She is scheduled for a f/u appointment in 4 weeks.  Orders Placed This Encounter  Procedures   POCT urine pregnancy    Return in about 4 weeks (around 11/22/2021) for IUD f/u.  Myrtis Ser CNM 10/25/2021 11:33 AM

## 2021-10-25 NOTE — Patient Instructions (Signed)
Nothing in vagina for 3 days (no sex, douching, tampons, etc...) Check your strings once a month to make sure you can feel them, if you are not able to please let us know If you develop a fever of 100.4 or more in the next few weeks, or if you develop severe abdominal pain, please let us know Use a backup method of birth control, such as condoms, for 2 weeks  

## 2021-10-27 ENCOUNTER — Encounter: Payer: Self-pay | Admitting: Cardiology

## 2021-10-27 ENCOUNTER — Ambulatory Visit (INDEPENDENT_AMBULATORY_CARE_PROVIDER_SITE_OTHER): Payer: Commercial Managed Care - PPO | Admitting: Cardiology

## 2021-10-27 VITALS — BP 130/80 | HR 91 | Ht 67.0 in | Wt 256.0 lb

## 2021-10-27 DIAGNOSIS — I493 Ventricular premature depolarization: Secondary | ICD-10-CM

## 2021-10-27 DIAGNOSIS — O165 Unspecified maternal hypertension, complicating the puerperium: Secondary | ICD-10-CM

## 2021-10-27 DIAGNOSIS — I480 Paroxysmal atrial fibrillation: Secondary | ICD-10-CM

## 2021-10-27 LAB — CYTOLOGY - PAP
Chlamydia: NEGATIVE
Comment: NEGATIVE
Comment: NEGATIVE
Comment: NORMAL
Diagnosis: UNDETERMINED — AB
High risk HPV: NEGATIVE
Neisseria Gonorrhea: NEGATIVE

## 2021-10-27 MED ORDER — METOPROLOL SUCCINATE ER 25 MG PO TB24: 12.5000 mg | ORAL_TABLET | Freq: Every day | ORAL | 3 refills | Status: DC

## 2021-10-27 NOTE — Patient Instructions (Signed)
Medication Instructions:  Your physician has recommended you make the following change in your medication: START: Metoprolol XL 12.5mg  daily.  *If you need a refill on your cardiac medications before your next appointment, please call your pharmacy*  Please take your blood pressure daily for 2 weeks and send in a MyChart message. Please include heart rates.   HOW TO TAKE YOUR BLOOD PRESSURE: Rest 5 minutes before taking your blood pressure. Don't smoke or drink caffeinated beverages for at least 30 minutes before. Take your blood pressure before (not after) you eat. Sit comfortably with your back supported and both feet on the floor (don't cross your legs). Elevate your arm to heart level on a table or a desk. Use the proper sized cuff. It should fit smoothly and snugly around your bare upper arm. There should be enough room to slip a fingertip under the cuff. The bottom edge of the cuff should be 1 inch above the crease of the elbow. Ideally, take 3 measurements at one sitting and record the average.   [email or web address] [email or web address]        FDA-cleared, clinical grade mobile EKG monitor: Jodelle Red is the most clinically-validated mobile EKG used by the world's leading cardiac care medical professionals With Basic service, know instantly if your heart rhythm is normal or if atrial fibrillation is detected, and email the last single EKG recording to yourself or your doctor Premium service, available for purchase through the K[employer]ia app for $9.99 per month or $99 per year, includes unlimited history and storage of your EKG recordings, a monthly EKG summary report to share with your doctor, along with the ability to track your blood pressure, activity and weight Includes one K[employer]iaMobile phone clip FREE SHIPPING: Standard delivery 1-3 business days. Orders placed by 11:00am PST will ship that afternoon. Otherwise, will ship next business day. All orders ship via [employer] from [place], [place] - sending an EKG Download app and set up profile. Run EKG - by placing 1-2 fingers on the silver plates After EKG is complete - Download PDF  - Skip password (if you apply a password the provider will need it to view the EKG) Click share button (square with upward arrow) in bottom left corner To send: choose MyChart (first time log into MyChart)  Pop up window about sending ECG Click continue Choose type of message Choose provider Type subject and message Click send (EKG should be attached)  - To send additional EKGs in one message click the paperclip image and bottom of page to attach.      Lab Work: NONE ordered at this time of appointment  If you have labs (blood work) drawn today and your tests are completely normal, you will receive your results only by: [place] (if you have MyChart) OR A paper copy in the mail If you have any lab test that is abnormal or we need to change your treatment, we will call you to review the results.   Testing/Procedures: Your physician has requested that you have an echocardiogram. Echocardiography is a painless test that uses sound waves to create images of your heart. It provides your doctor with information about the size and shape of your heart and how well your heart's chambers and valves are working. This procedure takes approximately one hour. There are no restrictions for this procedure.  Follow-Up: At [facility], you and your health needs are our priority.  As part of our continuing mission to  provide you with exceptional heart care, we have created designated Provider Care Teams.  These Care Teams include your primary Cardiologist (physician) and Advanced Practice Providers (APPs -  Physician Assistants and Nurse Practitioners) who all work together to provide you with the care you need, when you need it.  We recommend signing up for the patient portal called "MyChart".   Sign up information is provided on this After Visit Summary.  MyChart is used to connect with patients for Virtual Visits (Telemedicine).  Patients are able to view lab/test results, encounter notes, upcoming appointments, etc.  Non-urgent messages can be sent to your provider as well.   To learn more about what you can do with MyChart, go to [email or web address].    Your next appointment:   12 week(s)  The format for your next appointment:   In Person  Provider:   Berniece Salines, DO

## 2021-10-27 NOTE — Progress Notes (Signed)
Cardio-Obstetrics Clinic  Follow Up Note   Date:  11/02/2021   ID:  CACHET Kristi West, DOB May 27, 1990, MRN [id number]  PCP:  Kristi West, [place] [facility] Cardiologist:  Berniece Salines, DO  Electrophysiologist:  None        Referring MD: Kristi Balloon, FNP   Chief Complaint: " I am ok"  History of Present Illness:    Kristi West is a 31 y.o. female [[id number]] who returns for a post partum cardio-obstetrics follow up.  Medical history of paroxysmal atrial fibrillation which was diagnosed in 2017 during her first pregnancy.  At that time she was treated at [facility].  She had an echocardiogram which was normal.  Postpartum she really did not follow-up with cardiology. In 2019 the patient tells me that she had an episode where she was seen at [facility] when she was in atrial fibrillation.  Since that time she has not had any recurrences.   I did see the patient in April 2023 at that time it was her first visit.  She was in sinus rhythm.  She has been experiencing intermittent palpitations placed a monitor with the patient.  She had been given flecainide as needed by her OB/GYN I did keep the patient on this given that this medication is safe in pregnancy.  At her visit on 07/28/2021 - we discussed her monitor results which had no evidence of atrial fibrillation.  She has delivered  and tells me that she has been  having episodes of afib.   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   NIFEdipine (PROCARDIA XL) 30 MG 24 hr tablet Take 1 tablet (30 mg total) by mouth daily.     Allergies:   Ceclor [cefaclor], Other, and Peanut-containing drug products   Social History   Socioeconomic History   Marital status: Unknown    Spouse name: Not on file   Number of children: 2   Years of education: Not on file   Highest education level: Not on file  Occupational History   Not on file  Tobacco Use   Smoking status: Former    Years: 0.50    Types: Cigarettes   Smokeless tobacco: Never  Vaping Use   Vaping Use: Never used  Substance and Sexual Activity  Alcohol use: Not Currently    Comment: occa   Drug use: No   Sexual activity: Yes    Birth control/protection: Condom  Other Topics Concern   Not on file  Social History Narrative   Not on file   Social Determinants of Health   Financial Resource Strain: Low Risk  (10/18/2021)   Overall Financial Resource Strain (CARDIA)    Difficulty of Paying Living Expenses: Not hard at all  Food Insecurity: No Food Insecurity (10/18/2021)   Hunger Vital Sign    Worried About Running Out of Food in the Last Year: Never true    Ran Out of Food in the Last Year: Never true  Transportation Needs: No Transportation Needs (10/18/2021)   PRAPARE - Hydrologist (Medical): No    Lack of Transportation (Non-Medical): No  Physical Activity: Insufficiently Active (10/18/2021)   Exercise Vital Sign    Days of Exercise per Week: 1 day    Minutes of Exercise per Session: 10 min  Stress: No Stress Concern Present (10/18/2021)   [place]    Feeling of Stress : Only a little  Social  Connections: Moderately Integrated (10/18/2021)   Social Connection and Isolation Panel [NHANES]    Frequency of Communication with Friends and Family: More than three times a week    Frequency of Social Gatherings with Friends and Family: Once a week    Attends Religious Services: More than 4 times per year    Active Member of [employer] or Organizations: Yes    Attends Music therapist: More than 4 times per year    Marital Status: Separated      Family History  Problem Relation Age of Onset   SIDS Brother    Cancer Maternal Aunt        stomach   Cancer Maternal Grandmother        leukemia   Diabetes Paternal Grandmother    Stroke Paternal Grandfather       ROS:   Please see the history of present illness.    Inceased palpiations All other systems reviewed and are negative.   Labs/EKG Reviewed:    EKG:   EKG is was not ordered today.  Recent Labs: 09/07/2021: Hemoglobin 12.5; Platelets 343   Recent Lipid Panel Lab Results  Component Value Date/Time   CHOL 180 12/03/2014 03:34 PM   TRIG 99 12/03/2014 03:34 PM   HDL 48 12/03/2014 03:34 PM   CHOLHDL 3.8 12/03/2014 03:34 PM   LDLCALC 112 (H) 12/03/2014 03:34 PM    Physical Exam:    VS:  BP 130/80   Pulse 91   Ht 5\' 7"  (1.702 m)   Wt 256 lb (116.1 kg)   LMP 10/16/2021 (Approximate)   SpO2 99%   Breastfeeding Yes   BMI 40.10 kg/m     Wt Readings from Last 3 Encounters:  10/27/21 256 lb (116.1 kg)  10/25/21 254 lb (115.2 kg)  10/18/21 254 lb 2 oz (115.3 kg)     GEN:  Well nourished, well developed in no acute distress HEENT: Normal NECK: No JVD; No carotid bruits LYMPHATICS: No lymphadenopathy CARDIAC: RRR, no murmurs, rubs, gallops RESPIRATORY:  Clear to auscultation without rales, wheezing or rhonchi  ABDOMEN: Soft, non-tender, non-distended MUSCULOSKELETAL:  No edema; No deformity  SKIN: Warm and dry NEUROLOGIC:  Alert and oriented x 3 PSYCHIATRIC:  Normal affect    Risk Assessment/Risk  Calculators:  ASSESSMENT & PLAN:      PAF - with intermittent episodes, will start low dose toprol - will repeat echo to make sure that there is no structural abnormality leading to frequent episodes of PAF.   HTN - still on nifidpine   Lifestyle modification discussed.  Patient Instructions  Medication Instructions:  Your physician has recommended you make the following change in your medication: START: Metoprolol XL 12.5mg  daily.  *If you need a refill on your cardiac medications before your next appointment, please call your pharmacy*  Please take your blood pressure daily for 2 weeks and send in a MyChart message. Please include heart rates.   HOW TO TAKE YOUR BLOOD PRESSURE: Rest 5 minutes before taking your blood pressure. Don't smoke or drink caffeinated beverages for at least 30 minutes before. Take your blood pressure before (not after) you eat. Sit comfortably with your back supported and both feet on the floor (don't cross your legs). Elevate your arm to heart level on a table or a desk. Use the proper sized cuff. It should fit smoothly and snugly around your bare upper arm. There should be enough room to slip a fingertip under the cuff. The bottom edge of the cuff should be 1 inch above the crease of the elbow. Ideally, take 3 measurements at one sitting and record the average.   [email or web address] [email or web address]        FDA-cleared, clinical grade mobile EKG monitor: Jodelle Red is the most clinically-validated mobile EKG used by the world's leading cardiac care medical professionals With Basic service, know instantly if your heart rhythm is normal or if atrial fibrillation is detected, and email the last single EKG recording to yourself or your doctor Premium service, available for purchase through the Kardia app for $9.99 per month or $99 per year, includes unlimited history and storage of your EKG recordings, a monthly  EKG summary report to share with your doctor, along with the ability to track your blood pressure, activity and weight Includes one KardiaMobile phone clip FREE SHIPPING: Standard delivery 1-3 business days. Orders placed by 11:00am PST will ship that afternoon. Otherwise, will ship next business day. All orders ship via [employer] from [place], [place] - sending an EKG Download app and set up profile. Run EKG - by placing 1-2 fingers on the silver plates After EKG is complete - Download PDF  - Skip password (if you apply a password the provider will need it to view the EKG) Click share button (square with upward arrow) in bottom left corner To send: choose MyChart (first time log into MyChart)  Pop up window about sending ECG Click continue Choose type of message Choose provider Type subject and message Click send (EKG should be attached)  - To send additional EKGs in one message click the paperclip image and bottom of page to attach.      Lab Work: NONE ordered at this time of appointment  If you have labs (blood work) drawn today and your tests are completely normal, you will receive your results only by: [place] (if you have MyChart) OR A paper copy in the mail If you have any lab test that is abnormal or we need to change your treatment, we will call you to review the results.   Testing/Procedures: Your physician has requested that you have an echocardiogram. Echocardiography is a painless test that uses sound waves to create images of your heart. It provides your doctor with information  about the size and shape of your heart and how well your heart's chambers and valves are working. This procedure takes approximately one hour. There are no restrictions for this procedure.  Follow-Up: At [facility], you and your health needs are our priority.  As part of our continuing mission to provide you with exceptional heart care, we have created  designated Provider Care Teams.  These Care Teams include your primary Cardiologist (physician) and Advanced Practice Providers (APPs -  Physician Assistants and Nurse Practitioners) who all work together to provide you with the care you need, when you need it.  We recommend signing up for the patient portal called "MyChart".  Sign up information is provided on this After Visit Summary.  MyChart is used to connect with patients for Virtual Visits (Telemedicine).  Patients are able to view lab/test results, encounter notes, upcoming appointments, etc.  Non-urgent messages can be sent to your provider as well.   To learn more about what you can do with MyChart, go to [email or web address].    Your next appointment:   12 week(s)  The format for your next appointment:   In Person  Provider:   Berniece Salines, DO     Dispo:  Return in about 12 weeks (around 01/19/2022).   Medication Adjustments/Labs and Tests Ordered: Current medicines are reviewed at length with the patient today.  Concerns regarding medicines are outlined above.  Tests Ordered: Orders Placed This Encounter  Procedures   ECHOCARDIOGRAM COMPLETE   Medication Changes: Meds ordered this encounter  Medications   metoprolol succinate (TOPROL XL) 25 MG 24 hr tablet    Sig: Take 0.5 tablets (12.5 mg total) by mouth daily.    Dispense:  45 tablet    Refill:  3

## 2021-11-20 ENCOUNTER — Ambulatory Visit (HOSPITAL_COMMUNITY): Payer: Commercial Managed Care - PPO | Attending: Cardiology

## 2021-11-20 DIAGNOSIS — I480 Paroxysmal atrial fibrillation: Secondary | ICD-10-CM | POA: Insufficient documentation

## 2021-11-20 LAB — ECHOCARDIOGRAM COMPLETE
Area-P 1/2: 2.36 cm2
S' Lateral: 3 cm

## 2021-11-22 ENCOUNTER — Ambulatory Visit: Payer: Commercial Managed Care - PPO | Admitting: Obstetrics and Gynecology

## 2021-11-29 ENCOUNTER — Ambulatory Visit (INDEPENDENT_AMBULATORY_CARE_PROVIDER_SITE_OTHER): Payer: Commercial Managed Care - PPO | Admitting: Adult Health

## 2021-11-29 ENCOUNTER — Encounter: Payer: Self-pay | Admitting: Adult Health

## 2021-11-29 VITALS — BP 129/86 | HR 84 | Ht 67.0 in | Wt 251.0 lb

## 2021-11-29 DIAGNOSIS — Z30431 Encounter for routine checking of intrauterine contraceptive device: Secondary | ICD-10-CM | POA: Diagnosis not present

## 2021-11-29 NOTE — Progress Notes (Signed)
  Subjective:     Patient ID: Kristi West, female   DOB: 08-05-1990, 31 y.o.   MRN: [id number]  HPI Kristi West is a 31 year old female, married, [id number], in for IUD check, she is still bleeding. She is breastfeeding and baby will be 63 months old on 12/08/21. She had mirena placed 10/25/21.   Last pap was ASCUS negative HPV 10/25/21  PCP is Jannifer Rodney NP.  Review of Systems Still bleeding Reviewed past medical,surgical, social and family history. Reviewed medications and allergies.     Objective:   Physical Exam BP 129/86 (BP Location: Right Arm, Patient Position: Sitting, Cuff Size: Normal)   Pulse 84   Ht 5\' 7"  (1.702 m)   Wt 251 lb (113.9 kg)   Breastfeeding Yes   BMI 39.31 kg/m     Skin warm and dry.Pelvic: external genitalia is normal in appearance no lesions, vagina: +dark period blood without odor,urethra has no lesions or masses noted, cervix:smooth and bulbous,+IUD strings at os, they are short, uterus: normal size, shape and contour, non tender, no masses felt, adnexa: no masses or tenderness noted. Bladder is non tender and no masses felt.  Fall risk is low  Upstream - 11/29/21 1007       Pregnancy Intention Screening   Does the patient want to become pregnant in the next year? No    Does the patient's partner want to become pregnant in the next year? No    Would the patient like to discuss contraceptive options today? No      Contraception Wrap Up   Current Method IUD or IUS    End Method IUD or IUS    Contraception Counseling Provided No            Examination chaperoned by 13/08/23 LPN  Assessment:     1. IUD check up Mirena placed 10/25/21 +strings at os    Plan:     Follow up prn

## 2022-01-18 ENCOUNTER — Ambulatory Visit: Payer: Commercial Managed Care - PPO | Admitting: Cardiology

## 2022-01-30 ENCOUNTER — Encounter: Payer: Self-pay | Admitting: Advanced Practice Midwife

## 2022-02-01 ENCOUNTER — Encounter: Payer: Self-pay | Admitting: Advanced Practice Midwife

## 2022-02-01 ENCOUNTER — Telehealth (INDEPENDENT_AMBULATORY_CARE_PROVIDER_SITE_OTHER): Payer: Commercial Managed Care - PPO | Admitting: Advanced Practice Midwife

## 2022-02-01 DIAGNOSIS — F53 Postpartum depression: Secondary | ICD-10-CM | POA: Diagnosis not present

## 2022-02-01 NOTE — Progress Notes (Signed)
   TELEHEALTH GYNECOLOGY VISIT ENCOUNTER NOTE  Provider location: [facility] [place] [facility]   Patient location: Home  I connected with [name] on 02/03/22 at  2:50 PM EST by telephone and verified that I am speaking with the correct person using two identifiers. Patient was unable to do MyChart audiovisual encounter due to technical difficulties, she tried several times.    I discussed the limitations, risks, security and privacy concerns of performing an evaluation and management service by telephone and the availability of in person appointments. I also discussed with the patient that there may be a patient responsible charge related to this service. The patient expressed understanding and agreed to proceed.   History:  Kristi West is a 32 y.o. [phone] female being evaluated today for anxiety/depression.  She had a SVD in August 2023.  Now, c/o feelings of anxiety and possibly depression.  Denies SI, HI.  Breastfeeding (latching at home, pumping at work) and supply is getting lower. Already on a natural supplement. Offered referral to [facility], happily accepts.  Also offered rx for Reglan, will try that, along with some extra pumping.       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   Health Maintenance:  ASCUS pap and negative HRHPV on 10/23.   Review of Systems:  Pertinent items noted in HPI and remainder of comprehensive  ROS otherwise negative.  Physical Exam:   General:  Alert, oriented and cooperative.   Mental Status: Normal mood and affect perceived. Normal judgment and thought content.  Physical exam deferred due to nature of the encounter  Labs and Imaging No results found for this or any previous visit (from the past 336 hour(s)). No results found.    Assessment and Plan:     1. Postpartum depression Referral link to [facility] given       I discussed the assessment and treatment plan with the patient. The patient was provided an opportunity to ask questions and all were answered. The patient agreed with the plan and demonstrated an understanding of the instructions.   The patient was advised to call back or seek an in-person evaluation/go to the ED if the symptoms worsen or if the condition fails to improve as anticipated.  I provided 15 minutes of non-face-to-face time during this encounter.[place], [place] [facility]    Reglan, lunajoy

## 2022-02-01 NOTE — Patient Instructions (Signed)
[facility] offers online women's holistic mental health counseling and therapy provided by licensed mental health counselors and therapists.   You can refer yourself using the link below: (if it isn't clickable from your mychart account, copy and paste it in a new browser).  If you have ANY problems, please let me know and I will help troubleshoot.   [email or web address]  

## 2023-11-14 ENCOUNTER — Encounter: Payer: Self-pay | Admitting: Nurse Practitioner

## 2023-11-14 ENCOUNTER — Ambulatory Visit: Payer: Self-pay | Admitting: Nurse Practitioner

## 2023-11-14 ENCOUNTER — Ambulatory Visit (INDEPENDENT_AMBULATORY_CARE_PROVIDER_SITE_OTHER): Payer: Self-pay | Admitting: Nurse Practitioner

## 2023-11-14 VITALS — BP 125/80 | HR 71 | Temp 97.5°F | Ht 67.0 in | Wt 293.8 lb

## 2023-11-14 DIAGNOSIS — Z6841 Body Mass Index (BMI) 40.0 and over, adult: Secondary | ICD-10-CM

## 2023-11-14 DIAGNOSIS — F330 Major depressive disorder, recurrent, mild: Secondary | ICD-10-CM | POA: Diagnosis not present

## 2023-11-14 DIAGNOSIS — Z818 Family history of other mental and behavioral disorders: Secondary | ICD-10-CM

## 2023-11-14 DIAGNOSIS — H5500 Unspecified nystagmus: Secondary | ICD-10-CM

## 2023-11-14 DIAGNOSIS — F3281 Premenstrual dysphoric disorder: Secondary | ICD-10-CM | POA: Insufficient documentation

## 2023-11-14 DIAGNOSIS — E66813 Obesity, class 3: Secondary | ICD-10-CM | POA: Diagnosis not present

## 2023-11-14 DIAGNOSIS — Z0001 Encounter for general adult medical examination with abnormal findings: Secondary | ICD-10-CM | POA: Insufficient documentation

## 2023-11-14 DIAGNOSIS — F411 Generalized anxiety disorder: Secondary | ICD-10-CM

## 2023-11-14 DIAGNOSIS — E559 Vitamin D deficiency, unspecified: Secondary | ICD-10-CM | POA: Diagnosis not present

## 2023-11-14 LAB — BAYER DCA HB A1C WAIVED: HB A1C (BAYER DCA - WAIVED): 5.1 % (ref 4.8–5.6)

## 2023-11-14 MED ORDER — ESCITALOPRAM OXALATE 5 MG PO TABS: 5.0000 mg | ORAL_TABLET | Freq: Every day | ORAL | 0 refills | Status: DC

## 2023-11-14 MED ORDER — HYDROXYZINE PAMOATE 25 MG PO CAPS: 25.0000 mg | ORAL_CAPSULE | Freq: Three times a day (TID) | ORAL | 0 refills | Status: AC | PRN

## 2023-11-14 MED ORDER — HYDROXYZINE PAMOATE 25 MG PO CAPS: 25.0000 mg | ORAL_CAPSULE | Freq: Three times a day (TID) | ORAL | 0 refills | Status: DC | PRN

## 2023-11-14 NOTE — Addendum Note (Signed)
 Addended by: MAR CHIQUITA HERO on: 11/14/2023 12:05 PM   Modules accepted: Orders

## 2023-11-14 NOTE — Progress Notes (Signed)
 Subjective:  Patient ID: Kristi West, female    DOB: 1990/06/01, 33 y.o.   MRN: [id number]  Patient Care Team: Lavell Bari LABOR, FNP as PCP - General (Family Medicine) Tobb, Kardie, DO as PCP - Cardiology (Cardiology)   Chief Complaint:  Establish Care and Anxiety   HPI: Kristi West is a 33 y.o. female presenting on 11/14/2023 for Establish Care and Anxiety   Discussed the use of AI scribe software for clinical note transcription with the patient, who gave verbal consent to proceed.  History of Present Illness Kristi West is a 33 year old female who presents with concerns of depression and weight gain.  She experiences significant depression, particularly exacerbated premenstrually, with 'death anxiety' and a constant fear of something bad happening to her family, especially her mother. Work-related stress is a major factor, as she is the only Black woman in her company and feels overwhelmed. No suicidal thoughts, but she has thoughts of 'what if I wasn't here'. These feelings do not affect her sleep, as she can fall asleep easily, but she lacks energy to engage in activities. Anxiety spikes at work, but no current panic attacks.  She believes she may have an eating disorder, as she tends to stress eat. She has attempted to manage her weight by fasting, using a Weight Watchers app, and walking, but these efforts are recent, starting within the last two weeks. She notes feeling good the day after exercising but then experiences excessive sleepiness.  She is not currently on any medications for depression but has previously taken a generic form of Xanax, which she discontinued after it upset her stomach. She was on metoprolol  25 mg in 2023 for hypertension that appeared to be related to pregnancy. She has a history of postpartum hypertension and atrial fibrillation during pregnancy, which required hospitalization. No further episodes of AFib outside of pregnancy.  Her family history  is significant for bipolar disorder on her father's side, with her father, two aunts, and grandfather affected. She also reports a family history of various cancers, including lung cancer in her paternal grandfather, leukemia in her maternal grandmother, and multiple cancers in her maternal aunts.  She reports not getting enough sleep due to staying up late for personal time after her children are in bed. She has an IUD, which was implanted in November 2023, and she continues to have menstrual cycles. Her last cycle started on November 08, 2023. She has a history of a seizure at age 88. She does not smoke but drinks alcohol, typically wine or seltzer, about three times a week.       11/14/2023   11:09 AM 06/22/2021    9:19 AM 06/05/2021    8:50 AM  PHQ9 SCORE ONLY  PHQ-9 Total Score 7 6  6       Data saved with a previous flowsheet row definition       11/14/2023   11:10 AM 06/22/2021    9:20 AM 06/05/2021    8:54 AM 03/09/2021    9:57 AM  GAD 7 : Generalized Anxiety Score  Nervous, Anxious, on Edge 1 2 2 2   Control/stop worrying 1 2 1 1   Worry too much - different things 1 2 1 1   Trouble relaxing 0 1 0 1  Restless 0 0 0 0  Easily annoyed or irritable 2 1 1 1   Afraid - awful might happen 2 2 1 2   Total GAD 7 Score 7 10 6  8  Anxiety Difficulty Somewhat difficult          Relevant past medical, surgical, family, and social history reviewed and updated as indicated.  Allergies and medications reviewed and updated. Data reviewed: Chart in Epic.   Past Medical History:  Diagnosis Date   A-fib (HCC)    Abnormal Pap smear    Albinism    Anemia    Anxiety    Atrial fibrillation, unspecified    Heart murmur    Pregnant 07/07/2012   Seizures (HCC)    x1 as a child   Vaginal Pap smear, abnormal     Past Surgical History:  Procedure Laterality Date   TONSILLECTOMY     WISDOM TOOTH EXTRACTION      Social History   Socioeconomic History   Marital status: Married    Spouse  name: Not on file   Number of children: 2   Years of education: Not on file   Highest education level: Not on file  Occupational History   Not on file  Tobacco Use   Smoking status: Former    Types: Cigarettes   Smokeless tobacco: Never  Vaping Use   Vaping status: Never Used  Substance and Sexual Activity   Alcohol use: Not Currently    Comment: occa   Drug use: No   Sexual activity: Yes    Birth control/protection: I.U.D.    Comment: mirena   Other Topics Concern   Not on file  Social History Narrative   Not on file   Social Drivers of Health   Financial Resource Strain: Low Risk  (11/14/2023)   Overall Financial Resource Strain (CARDIA)    Difficulty of Paying Living Expenses: Not hard at all  Food Insecurity: No Food Insecurity (11/14/2023)   Hunger Vital Sign    Worried About Running Out of Food in the Last Year: Never true    Ran Out of Food in the Last Year: Never true  Transportation Needs: No Transportation Needs (11/14/2023)   PRAPARE - Administrator, Civil Service (Medical): No    Lack of Transportation (Non-Medical): No  Physical Activity: Inactive (11/14/2023)   Exercise Vital Sign    Days of Exercise per Week: 0 days    Minutes of Exercise per Session: 0 min  Stress: No Stress Concern Present (10/18/2021)   [employer] of Occupational Health - Occupational Stress Questionnaire    Feeling of Stress : Only a little  Social Connections: Moderately Integrated (10/18/2021)   Social Connection and Isolation Panel    Frequency of Communication with Friends and Family: More than three times a week    Frequency of Social Gatherings with Friends and Family: Once a week    Attends Religious Services: More than 4 times per year    Active Member of [employer] or Organizations: Yes    Attends Banker Meetings: More than 4 times per year    Marital Status: Separated  Intimate Partner Violence: Not At Risk (11/14/2023)   Humiliation, Afraid,  Rape, and Kick questionnaire    Fear of Current or Ex-Partner: No    Emotionally Abused: No    Physically Abused: No    Sexually Abused: No    Outpatient Encounter Medications as of 11/14/2023  Medication Sig   escitalopram  (LEXAPRO ) 5 MG tablet Take 1 tablet (5 mg total) by mouth daily.   hydrOXYzine (VISTARIL) 25 MG capsule Take 1 capsule (25 mg total) by mouth every 8 (eight) hours as needed.   levonorgestrel  (  MIRENA ) 20 MCG/DAY IUD 1 each by Intrauterine route once. (Patient not taking: Reported on 11/14/2023)   [DISCONTINUED] metoprolol  succinate (TOPROL  XL) 25 MG 24 hr tablet Take 0.5 tablets (12.5 mg total) by mouth daily. (Patient not taking: Reported on 11/14/2023)   [DISCONTINUED] NIFEdipine  (PROCARDIA  XL) 30 MG 24 hr tablet Take 1 tablet (30 mg total) by mouth daily. (Patient not taking: Reported on 11/14/2023)   No facility-administered encounter medications on file as of 11/14/2023.    Allergies  Allergen Reactions   Ceclor [Cefaclor] Hives   Other Itching and Swelling    Fresh fruits cause the patient's mouth and throat to itch and swell.   Peanut-Containing Drug Products Itching and Swelling    All types of nuts cause the patient's mouth and throat to swell and itch.    Pertinent ROS per HPI, otherwise unremarkable      Objective:  BP 125/80   Pulse 71   Temp (!) 97.5 F (36.4 C) (Temporal)   Ht 5' 7 (1.702 m)   Wt 293 lb 12.8 oz (133.3 kg)   LMP 11/08/2023 (Exact Date)   SpO2 99%   Breastfeeding No   BMI 46.02 kg/m    Wt Readings from Last 3 Encounters:  11/14/23 293 lb 12.8 oz (133.3 kg)  11/29/21 251 lb (113.9 kg)  10/27/21 256 lb (116.1 kg)   BP Readings from Last 3 Encounters:  11/14/23 125/80  11/29/21 129/86  10/27/21 130/80    Physical Exam Vitals and nursing note reviewed.  Constitutional:      General: She is not in acute distress.    Appearance: She is obese.  HENT:     Head: Normocephalic and atraumatic.     Right Ear: Tympanic  membrane and external ear normal. There is no impacted cerumen.     Nose: Nose normal.     Mouth/Throat:     Mouth: Mucous membranes are moist.  Eyes:     General: No scleral icterus.    Extraocular Movements: Extraocular movements intact.     Conjunctiva/sclera: Conjunctivae normal.     Pupils: Pupils are equal, round, and reactive to light.  Neck:     Vascular: No carotid bruit.  Cardiovascular:     Heart sounds: Normal heart sounds.  Pulmonary:     Effort: Pulmonary effort is normal.     Breath sounds: Normal breath sounds.  Abdominal:     General: Bowel sounds are normal.     Palpations: Abdomen is soft.  Musculoskeletal:        General: Normal range of motion.     Cervical back: Normal range of motion and neck supple. No rigidity or tenderness.     Right lower leg: No edema.     Left lower leg: No edema.  Lymphadenopathy:     Cervical: No cervical adenopathy.  Skin:    General: Skin is warm and dry.     Findings: No rash.  Neurological:     Mental Status: She is alert and oriented to person, place, and time.  Psychiatric:        Mood and Affect: Mood normal.        Behavior: Behavior normal.        Thought Content: Thought content normal.        Judgment: Judgment normal.    Physical Exam VITALS: BP- 125/80     Results for orders placed or performed in visit on 11/20/21  ECHOCARDIOGRAM COMPLETE   Collection Time: 11/20/21 11:57  AM  Result Value Ref Range   Area-P 1/2 2.36 cm2   S' Lateral 3.00 cm       Pertinent labs & imaging results that were available during my care of the patient were reviewed by me and considered in my medical decision making.  Assessment & Plan:  Kristi West was seen today for establish care and anxiety.  Diagnoses and all orders for this visit:  Encounter for general adult medical examination with abnormal findings -     CBC with Differential/Platelet -     Comprehensive metabolic panel with GFR -     Thyroid  Panel With TSH -      Lipid panel -     Bayer DCA Hb A1c Waived -     VITAMIN D  25 Hydroxy (Vit-D Deficiency, Fractures)  Class 3 severe obesity without serious comorbidity with body mass index (BMI) of 45.0 to 49.9 in adult, unspecified obesity type (HCC) -     Thyroid  Panel With TSH -     Lipid panel -     Bayer DCA Hb A1c Waived -     Bayer DCA Hb A1c Waived  Vitamin D  deficiency -     VITAMIN D  25 Hydroxy (Vit-D Deficiency, Fractures)  Premenstrual dysphoric disorder -     escitalopram  (LEXAPRO ) 5 MG tablet; Take 1 tablet (5 mg total) by mouth daily.  Family history of bipolar disorder  Mild episode of recurrent major depressive disorder -     Lipid panel -     Bayer DCA Hb A1c Waived  GAD (generalized anxiety disorder) -     Thyroid  Panel With TSH -     Bayer DCA Hb A1c Waived -     hydrOXYzine (VISTARIL) 25 MG capsule; Take 1 capsule (25 mg total) by mouth every 8 (eight) hours as needed.  Nystagmus     Assessment and Plan Kristi West 33 year old African-American female seen today for chronic disease management, no acute distress Assessment & Plan Premenstrual Dysphoric Disorder (PMDD) with comorbid Depression and Anxiety PMDD with comorbid depression and anxiety, exacerbated premenstrually. Positive PHQ-9 and GAD-7 screenings. Lexapro  chosen for its lower risk of weight gain and side effects. Hydroxyzine prescribed for acute anxiety episodes. - Prescribe Lexapro  5 mg daily. - Prescribe hydroxyzine as needed for anxiety. - Discuss side effects of Lexapro  including dry mouth, dizziness, and possible headache. - Schedule follow-up in one month to assess response to Lexapro , either in-person or virtually.  Obesity Recent weight gain with attempts to manage through fasting, Weight Watchers app, and walking. Discussed impact of depression and anxiety on weight and energy levels.  Suspected Vitamin D  deficiency Suspected vitamin D  deficiency, which may contribute to mood disturbances. - Order  vitamin D  level test.  Adult Wellness Visit Blood pressure is 125/80 mmHg. Advised to keep diastolic below 80 mmHg. Discussed dietary modifications to manage blood pressure. - Advise reducing salt intake and limiting caffeine  consumption. - Encourage use of matcha as a coffee alternative.  General Health Maintenance Family history of various cancers. Discussed importance of early breast cancer screening due to family history. - Plan to start breast cancer screening at age 60. - Advise monthly self-breast exams one week after menstrual cycle.   Labs  CBC, CMP,  lipid,  A1c,  vitamin D , TSH result pending  Continue all other maintenance medications.  Follow up plan: Return in about 4 weeks (around 12/12/2023) for medication dose titration.   Continue healthy lifestyle choices, including diet (rich in fruits, vegetables,  and lean proteins, and low in salt and simple carbohydrates) and exercise (at least 30 minutes of moderate physical activity daily).  Educational handout given for    Clinical References  BMI for Adults Body mass index (BMI) is a number found using a person's weight and height. BMI can help tell how much of a person's weight is made up of fat. BMI does not measure body fat directly. It is used instead of tests that directly measure body fat, which can be difficult and expensive. What are BMI measurements used for? BMI is useful to: Find out if your weight puts you at higher risk for medical problems. Help recommend changes, such as in diet and exercise. This can help you reach a healthy weight. BMI screening can be done again to see if these changes are working. How is BMI calculated? Your height and weight are measured. The BMI is found from those numbers. This can be done with U[place] or metric measurements. Note that charts and online BMI calculators are available to help you find your BMI quickly and easily without doing these calculations. To calculate your BMI in  [place] measurements: Measure your weight in pounds (lb). Multiply the number of pounds by 703. So, for an adult who weighs 150 lb, multiply that number by 703: 150 x 703, which equals 105,450. Measure your height in inches. Then multiply that number by itself to get a measurement called inches squared. So, for an adult who is 70 inches tall, the inches squared measurement is 70 inches x 70 inches, which equals 4,900 inches squared. Divide the total from step 2 (number of lb x 703) by the total from step 3 (inches squared): 105,450  4,900 = 21.5. This is your BMI. To calculate your BMI in metric measurements:  Measure your weight in kilograms (kg). For this example, the weight is 70 kg. Measure your height in meters (m). Then multiply that number by itself to get a measurement called meters squared. So, for an adult who is 1.75 m tall, the meters squared measurement is 1.75 m x 1.75 m, which equals 3.1 meters squared. Divide the number of kilograms (your weight) by the meters squared number. In this example: 70  3.1 = 22.6. This is your BMI. What do the results mean? BMI charts are used to see if you are underweight, normal weight, overweight, or obese. The following guidelines will be used: Underweight: BMI less than 18.5. Normal weight: BMI between 18.5 and 24.9. Overweight: BMI between 25 and 29.9. Obese: BMI of 30 or above. BMI is a tool and cannot diagnose a condition. Talk with your health care provider about what your BMI means for you. Keep these notes in mind: Weight includes fat and muscle. Someone with a muscular build, such as an athlete, may have a BMI that is higher than 24.9. In cases like these, BMI is not a correct measure of body fat. If you have a BMI of 25 or higher, your provider may need to do more testing to find out if excess body fat is the cause. BMI is measured the same way for males and females. Females usually have more body fat than males of the same height  and weight. Where to find more information For more information about BMI, including tools to quickly find your BMI, go to: Centers for Disease Control and Prevention: TonerPromos.no American Heart Association: heart.org National Heart, Lung, and Blood Institute: BuffaloDryCleaner.gl This information is not intended to replace advice given to  you by your health care provider. Make sure you discuss any questions you have with your health care provider. Document Revised: 09/28/2021 Document Reviewed: 09/21/2021 Elsevier Patient Education  2024 Elsevier Inc. Obesity, Adult Obesity is having too much body fat. Being obese means that your weight is more than what is healthy for you.  BMI (body mass index) is a number that explains how much body fat you have. If you have a BMI of 30 or more, you are obese. Obesity can cause serious health problems, such as: Stroke. Coronary artery disease (CAD). Type 2 diabetes. Some types of cancer. High blood pressure (hypertension). High cholesterol. Gallbladder stones. Obesity can also contribute to: Osteoarthritis. Sleep apnea. Infertility problems. What are the causes? Eating meals each day that are high in calories, sugar, and fat. Drinking a lot of drinks that have sugar in them. Being born with genes that may make you more likely to become obese. Having a medical condition that causes obesity. Taking certain medicines. Sitting a lot (having a sedentary lifestyle). Not getting enough sleep. What increases the risk? Having a family history of obesity. Living in an area with limited access to: [place], recreation centers, or sidewalks. Healthy food choices, such as grocery stores and farmers' markets. What are the signs or symptoms? The main sign is having too much body fat. How is this treated? Treatment for this condition often includes changing your lifestyle. Treatment may include: Changing your diet. This may include making a healthy meal  plan. Exercise. This may include activity that causes your heart to beat faster (aerobic exercise) and strength training. Work with your doctor to design a program that works for you. Medicine to help you lose weight. This may be used if you are not able to lose one pound a week after 6 weeks of healthy eating and more exercise. Treating conditions that cause the obesity. Surgery. Options may include gastric banding and gastric bypass. This may be done if: Other treatments have not helped to improve your condition. You have a BMI of 40 or higher. You have life-threatening health problems related to obesity. Follow these instructions at home: Eating and drinking  Follow advice from your doctor about what to eat and drink. Your doctor may tell you to: Limit fast food, sweets, and processed snack foods. Choose low-fat options. For example, choose low-fat milk instead of whole milk. Eat five or more servings of fruits or vegetables each day. Eat at home more often. This gives you more control over what you eat. Choose healthy foods when you eat out. Learn to read food labels. This will help you learn how much food is in one serving. Keep low-fat snacks available. Avoid drinks that have a lot of sugar in them. These include soda, fruit juice, iced tea with sugar, and flavored milk. Drink enough water to keep your pee (urine) pale yellow. Do not go on fad diets. Physical activity Exercise often, as told by your doctor. Most adults should get up to 150 minutes of moderate-intensity exercise every week.Ask your doctor: What types of exercise are safe for you. How often you should exercise. Warm up and stretch before being active. Do slow stretching after being active (cool down). Rest between times of being active. Lifestyle Work with your doctor and a food expert (dietitian) to set a weight-loss goal that is best for you. Limit your screen time. Find ways to reward yourself that do not  involve food. Do not drink alcohol if: Your doctor tells you  not to drink. You are pregnant, may be pregnant, or are planning to become pregnant. If you drink alcohol: Limit how much you have to: 0-1 drink a day for women. 0-2 drinks a day for men. Know how much alcohol is in your drink. In the [place] one drink equals one 12 oz bottle of beer (355 mL), one 5 oz glass of wine (148 mL), or one 1 oz glass of hard liquor (44 mL). General instructions Keep a weight-loss journal. This can help you keep track of: The food that you eat. How much exercise you get. Take over-the-counter and prescription medicines only as told by your doctor. Take vitamins and supplements only as told by your doctor. Think about joining a support group. Pay attention to your mental health as obesity can lead to depression or self esteem issues. Keep all follow-up visits. Contact a doctor if: You cannot meet your weight-loss goal after you have changed your diet and lifestyle for 6 weeks. You are having trouble breathing. Summary Obesity is having too much body fat. Being obese means that your weight is more than what is healthy for you. Work with your doctor to set a weight-loss goal. Get regular exercise as told by your doctor. This information is not intended to replace advice given to you by your health care provider. Make sure you discuss any questions you have with your health care provider. Document Revised: 08/16/2020 Document Reviewed: 08/16/2020 Elsevier Patient Education  2024 Elsevier Inc. Health Maintenance, Female Adopting a healthy lifestyle and getting preventive care are important in promoting health and wellness. Ask your health care provider about: The right schedule for you to have regular tests and exams. Things you can do on your own to prevent diseases and keep yourself healthy. What should I know about diet, weight, and exercise? Eat a healthy diet  Eat a diet that includes plenty of  vegetables, fruits, low-fat dairy products, and lean protein. Do not eat a lot of foods that are high in solid fats, added sugars, or sodium. Maintain a healthy weight Body mass index (BMI) is used to identify weight problems. It estimates body fat based on height and weight. Your health care provider can help determine your BMI and help you achieve or maintain a healthy weight. Get regular exercise Get regular exercise. This is one of the most important things you can do for your health. Most adults should: Exercise for at least 150 minutes each week. The exercise should increase your heart rate and make you sweat (moderate-intensity exercise). Do strengthening exercises at least twice a week. This is in addition to the moderate-intensity exercise. Spend less time sitting. Even light physical activity can be beneficial. Watch cholesterol and blood lipids Have your blood tested for lipids and cholesterol at 32 years of age, then have this test every 5 years. Have your cholesterol levels checked more often if: Your lipid or cholesterol levels are high. You are older than 32 years of age. You are at high risk for heart disease. What should I know about cancer screening? Depending on your health history and family history, you may need to have cancer screening at various ages. This may include screening for: Breast cancer. Cervical cancer. Colorectal cancer. Skin cancer. Lung cancer. What should I know about heart disease, diabetes, and high blood pressure? Blood pressure and heart disease High blood pressure causes heart disease and increases the risk of stroke. This is more likely to develop in people who have high blood pressure  readings or are overweight. Have your blood pressure checked: Every 3-5 years if you are 46-73 years of age. Every year if you are 65 years old or older. Diabetes Have regular diabetes screenings. This checks your fasting blood sugar level. Have the screening  done: Once every three years after age 68 if you are at a normal weight and have a low risk for diabetes. More often and at a younger age if you are overweight or have a high risk for diabetes. What should I know about preventing infection? Hepatitis B If you have a higher risk for hepatitis B, you should be screened for this virus. Talk with your health care provider to find out if you are at risk for hepatitis B infection. Hepatitis C Testing is recommended for: Everyone born from [birth year] through 1964. Anyone with known risk factors for hepatitis C. Sexually transmitted infections (STIs) Get screened for STIs, including gonorrhea and chlamydia, if: You are sexually active and are younger than 32 years of age. You are older than 32 years of age and your health care provider tells you that you are at risk for this type of infection. Your sexual activity has changed since you were last screened, and you are at increased risk for chlamydia or gonorrhea. Ask your health care provider if you are at risk. Ask your health care provider about whether you are at high risk for HIV. Your health care provider may recommend a prescription medicine to help prevent HIV infection. If you choose to take medicine to prevent HIV, you should first get tested for HIV. You should then be tested every 3 months for as long as you are taking the medicine. Pregnancy If you are about to stop having your period (premenopausal) and you may become pregnant, seek counseling before you get pregnant. Take 400 to 800 micrograms (mcg) of folic acid every day if you become pregnant. Ask for birth control (contraception) if you want to prevent pregnancy. Osteoporosis and menopause Osteoporosis is a disease in which the bones lose minerals and strength with aging. This can result in bone fractures. If you are 28 years old or older, or if you are at risk for osteoporosis and fractures, ask your health care provider if you should: Be  screened for bone loss. Take a calcium or vitamin D  supplement to lower your risk of fractures. Be given hormone replacement therapy (HRT) to treat symptoms of menopause. Follow these instructions at home: Alcohol use Do not drink alcohol if: Your health care provider tells you not to drink. You are pregnant, may be pregnant, or are planning to become pregnant. If you drink alcohol: Limit how much you have to: 0-1 drink a day. Know how much alcohol is in your drink. In the [place] one drink equals one 12 oz bottle of beer (355 mL), one 5 oz glass of wine (148 mL), or one 1 oz glass of hard liquor (44 mL). Lifestyle Do not use any products that contain nicotine or tobacco. These products include cigarettes, chewing tobacco, and vaping devices, such as e-cigarettes. If you need help quitting, ask your health care provider. Do not use street drugs. Do not share needles. Ask your health care provider for help if you need support or information about quitting drugs. General instructions Schedule regular health, dental, and eye exams. Stay current with your vaccines. Tell your health care provider if: You often feel depressed. You have ever been abused or do not feel safe at home. Summary Adopting a  healthy lifestyle and getting preventive care are important in promoting health and wellness. Follow your health care provider's instructions about healthy diet, exercising, and getting tested or screened for diseases. Follow your health care provider's instructions on monitoring your cholesterol and blood pressure. This information is not intended to replace advice given to you by your health care provider. Make sure you discuss any questions you have with your health care provider. Document Revised: 05/30/2020 Document Reviewed: 05/30/2020 Elsevier Patient Education  2024 [employer]. Preventive Care 33-24 Years Old, Female Preventive care refers to lifestyle choices and visits with your  health care provider that can promote health and wellness. Preventive care visits are also called wellness exams. What can I expect for my preventive care visit? Counseling During your preventive care visit, your health care provider may ask about your: Medical history, including: Past medical problems. Family medical history. Pregnancy history. Current health, including: Menstrual cycle. Method of birth control. Emotional well-being. Home life and relationship well-being. Sexual activity and sexual health. Lifestyle, including: Alcohol, nicotine or tobacco, and drug use. Access to firearms. Diet, exercise, and sleep habits. Work and work Astronomer. Sunscreen use. Safety issues such as seatbelt and bike helmet use. Physical exam Your health care provider may check your: Height and weight. These may be used to calculate your BMI (body mass index). BMI is a measurement that tells if you are at a healthy weight. Waist circumference. This measures the distance around your waistline. This measurement also tells if you are at a healthy weight and may help predict your risk of certain diseases, such as type 2 diabetes and high blood pressure. Heart rate and blood pressure. Body temperature. Skin for abnormal spots. What immunizations do I need?  Vaccines are usually given at various ages, according to a schedule. Your health care provider will recommend vaccines for you based on your age, medical history, and lifestyle or other factors, such as travel or where you work. What tests do I need? Screening Your health care provider may recommend screening tests for certain conditions. This may include: Pelvic exam and Pap test. Lipid and cholesterol levels. Diabetes screening. This is done by checking your blood sugar (glucose) after you have not eaten for a while (fasting). Hepatitis B test. Hepatitis C test. HIV (human immunodeficiency virus) test. STI (sexually transmitted infection)  testing, if you are at risk. BRCA-related cancer screening. This may be done if you have a family history of breast, ovarian, tubal, or peritoneal cancers. Talk with your health care provider about your test results, treatment options, and if necessary, the need for more tests. Follow these instructions at home: Eating and drinking  Eat a healthy diet that includes fresh fruits and vegetables, whole grains, lean protein, and low-fat dairy products. Take vitamin and mineral supplements as recommended by your health care provider. Do not drink alcohol if: Your health care provider tells you not to drink. You are pregnant, may be pregnant, or are planning to become pregnant. If you drink alcohol: Limit how much you have to 0-1 drink a day. Know how much alcohol is in your drink. In the [place] one drink equals one 12 oz bottle of beer (355 mL), one 5 oz glass of wine (148 mL), or one 1 oz glass of hard liquor (44 mL). Lifestyle Brush your teeth every morning and night with fluoride toothpaste. Floss one time each day. Exercise for at least 30 minutes 5 or more days each week. Do not use any products that contain  nicotine or tobacco. These products include cigarettes, chewing tobacco, and vaping devices, such as e-cigarettes. If you need help quitting, ask your health care provider. Do not use drugs. If you are sexually active, practice safe sex. Use a condom or other form of protection to prevent STIs. If you do not wish to become pregnant, use a form of birth control. If you plan to become pregnant, see your health care provider for a prepregnancy visit. Find healthy ways to manage stress, such as: Meditation, yoga, or listening to music. Journaling. Talking to a trusted person. Spending time with friends and family. Minimize exposure to UV radiation to reduce your risk of skin cancer. Safety Always wear your seat belt while driving or riding in a vehicle. Do not drive: If you have been  drinking alcohol. Do not ride with someone who has been drinking. If you have been using any mind-altering substances or drugs. While texting. When you are tired or distracted. Wear a helmet and other protective equipment during sports activities. If you have firearms in your house, make sure you follow all gun safety procedures. Seek help if you have been physically or sexually abused. What's next? Go to your health care provider once a year for an annual wellness visit. Ask your health care provider how often you should have your eyes and teeth checked. Stay up to date on all vaccines. This information is not intended to replace advice given to you by your health care provider. Make sure you discuss any questions you have with your health care provider. Document Revised: 07/06/2020 Document Reviewed: 07/06/2020 Elsevier Patient Education  2024 Else[employer] Inc. Depression Screening: What to Know Depression screening is something your health care provider can use to help find out if you have signs or symptoms of depression. Depression, or symptoms of depression, can: Make it hard to do everyday things. Raise the chance of having heart problems. Make other health issues worse. Be caused by physical conditions that could require treatment, like hypothyroidism, long-term pain, heart disease, and cancer. What are the screening tests? There are many types of depression screenings, but they all usually involve questions that: Your provider asks you directly. You answer yourself, like on paper or on a computer. Who should be screened for depression? Everyone over 35 years old should be screened for depression, especially if they: Have a long-term condition or illness. Are recovering from a serious illness or injury. Are pregnant or just had a baby. Have another mental health condition. Think they have signs or symptoms of depression, like being very sad or down. What do my results mean? A  positive result means that you have some signs or symptoms of depression, but doesn't always mean you have depression. If you have a positive result, next steps can include: Answering more questions about your symptoms and what is happening in your life. Doing a physical check-up or taking blood or pee (urine) samples to find out if something other than depression might be causing your symptoms. Referring you to a mental health specialist. Because the results come from the answers you give to the screening questions, be honest so that you and your provider can work together to figure out the next best steps for you. Get help right away if: You feel like you may hurt yourself or others. You have thoughts about taking your own life. You have thoughts or feelings that worry you. These symptoms may be an emergency. Take one of these steps right away: Go to  your nearest emergency room. Call 911. Contact the Suicide & Crisis Lifeline (24/7, free and confidential): Call or text 988. Chat online at chat.NewsActor.se. For Veterans and their loved ones: Call 988 and press 1. Text the [employer] at 502-868-5665. Chat online at [email or web address].si. This information is not intended to replace advice given to you by your health care provider. Make sure you discuss any questions you have with your health care provider. Document Revised: 05/05/2023 Document Reviewed: 05/05/2023 Elsevier Patient Education  2025 Elsevier Inc. Generalized Anxiety Disorder, Adult Generalized anxiety disorder (GAD) is a mental health condition. Unlike normal worries, anxiety related to GAD is not triggered by a specific event. These worries do not fade or get better with time. GAD interferes with relationships, work, and school. GAD symptoms can vary from mild to severe. People with severe GAD can have intense waves of anxiety with physical symptoms that are similar to panic attacks. What are the causes? The exact  cause of GAD is not known, but the following are believed to have an impact: Differences in natural brain chemicals. Genes passed down from parents to children. Differences in the way threats are perceived. Development and stress during childhood. Personality. What increases the risk? The following factors may make you more likely to develop this condition: Being female. Having a family history of anxiety disorders. Being very shy. Experiencing very stressful life events, such as the death of a loved one. Having a very stressful family environment. What are the signs or symptoms? People with GAD often worry excessively about many things in their lives, such as their health and family. Symptoms may also include: Mental and emotional symptoms: Worrying excessively about natural disasters. Fear of being late. Difficulty concentrating. Fears that others are judging your performance. Physical symptoms: Fatigue. Headaches, muscle tension, muscle twitches, trembling, or feeling shaky. Feeling like your heart is pounding or beating very fast. Feeling out of breath or like you cannot take a deep breath. Having trouble falling asleep or staying asleep, or experiencing restlessness. Sweating. Nausea, diarrhea, or irritable bowel syndrome (IBS). Behavioral symptoms: Experiencing erratic moods or irritability. Avoidance of new situations. Avoidance of people. Extreme difficulty making decisions. How is this diagnosed? This condition is diagnosed based on your symptoms and medical history. You will also have a physical exam. Your health care provider may perform tests to rule out other possible causes of your symptoms. To be diagnosed with GAD, a person must have anxiety that: Is out of his or her control. Affects several different aspects of his or her life, such as work and relationships. Causes distress that makes him or her unable to take part in normal activities. Includes at least  three symptoms of GAD, such as restlessness, fatigue, trouble concentrating, irritability, muscle tension, or sleep problems. Before your health care provider can confirm a diagnosis of GAD, these symptoms must be present more days than they are not, and they must last for 6 months or longer. How is this treated? This condition may be treated with: Medicine. Antidepressant medicine is usually prescribed for long-term daily control. Anti-anxiety medicines may be added in severe cases, especially when panic attacks occur. Talk therapy (psychotherapy). Certain types of talk therapy can be helpful in treating GAD by providing support, education, and guidance. Options include: Cognitive behavioral therapy (CBT). People learn coping skills and self-calming techniques to ease their physical symptoms. They learn to identify unrealistic thoughts and behaviors and to replace them with more appropriate thoughts and behaviors. Acceptance and commitment  therapy (ACT). This treatment teaches people how to be mindful as a way to cope with unwanted thoughts and feelings. Biofeedback. This process trains you to manage your body's response (physiological response) through breathing techniques and relaxation methods. You will work with a therapist while machines are used to monitor your physical symptoms. Stress management techniques. These include yoga, meditation, and exercise. A mental health specialist can help determine which treatment is best for you. Some people see improvement with one type of therapy. However, other people require a combination of therapies. Follow these instructions at home: Lifestyle Maintain a consistent routine and schedule. Anticipate stressful situations. Create a plan and allow extra time to work with your plan. Practice stress management or self-calming techniques that you have learned from your therapist or your health care provider. Exercise regularly and spend time outdoors. Eat a  healthy diet that includes plenty of vegetables, fruits, whole grains, low-fat dairy products, and lean protein. Do not eat a lot of foods that are high in fat, added sugar, or salt (sodium). Drink plenty of water. Avoid alcohol. Alcohol can increase anxiety. Avoid caffeine  and certain over-the-counter cold medicines. These may make you feel worse. Ask your pharmacist which medicines to avoid. General instructions Take over-the-counter and prescription medicines only as told by your health care provider. Understand that you are likely to have setbacks. Accept this and be kind to yourself as you persist to take better care of yourself. Anticipate stressful situations. Create a plan and allow extra time to work with your plan. Recognize and accept your accomplishments, even if you judge them as small. Spend time with people who care about you. Keep all follow-up visits. This is important. Where to find more information [employer] of Mental Health: http://www.maynard.net/ Substance Abuse and Mental Health Services: SkateOasis.com.pt Contact a health care provider if: Your symptoms do not get better. Your symptoms get worse. You have signs of depression, such as: A persistently sad or irritable mood. Loss of enjoyment in activities that used to bring you joy. Change in weight or eating. Changes in sleeping habits. Get help right away if: You have thoughts about hurting yourself or others. If you ever feel like you may hurt yourself or others, or have thoughts about taking your own life, get help right away. Go to your nearest emergency department or: Call your local emergency services (911 in the [place] Call a suicide crisis helpline, such as the National Suicide Prevention Lifeline at 4037577997 or 988 in the [place] This is open 24 hours a day in the [place] If you're a Veteran: Call 988 and press 1. This is open 24 hours a day. Text the [employer] at 862-768-3467. Summary Generalized  anxiety disorder (GAD) is a mental health condition that involves worry that is not triggered by a specific event. People with GAD often worry excessively about many things in their lives, such as their health and family. GAD may cause symptoms such as restlessness, trouble concentrating, sleep problems, frequent sweating, nausea, diarrhea, headaches, and trembling or muscle twitching. A mental health specialist can help determine which treatment is best for you. Some people see improvement with one type of therapy. However, other people require a combination of therapies. This information is not intended to replace advice given to you by your health care provider. Make sure you discuss any questions you have with your health care provider. Document Revised: 08/23/2022 Document Reviewed: 05/01/2020 Elsevier Patient Education  2024 Elsevier Inc. Managing Anxiety, Adult After being diagnosed  with anxiety, you may be relieved to know why you have felt or behaved a certain way. You may also feel overwhelmed about the treatment ahead and what it will mean for your life. With care and support, you can manage your anxiety. How to manage lifestyle changes Understanding the difference between stress and anxiety Although stress can play a role in anxiety, it is not the same as anxiety. Stress is your body's reaction to life changes and events, both good and bad. Stress is often caused by something external, such as a deadline, test, or competition. It normally goes away after the event has ended and will last just a few hours. But, stress can be ongoing and can lead to more than just stress. Anxiety is caused by something internal, such as imagining a terrible outcome or worrying that something will go wrong that will greatly upset you. Anxiety often does not go away even after the event is over, and it can become a long-term (chronic) worry. Lowering stress and anxiety Talk with your health care provider or a  counselor to learn more about lowering anxiety and stress. They may suggest tension-reduction techniques, such as: Music. Spend time creating or listening to music that you enjoy and that inspires you. Mindfulness-based meditation. Practice being aware of your normal breaths while not trying to control your breathing. It can be done while sitting or walking. Centering prayer. Focus on a word, phrase, or sacred image that means something to you and brings you peace. Deep breathing. Expand your stomach and inhale slowly through your nose. Hold your breath for 3-5 seconds. Then breathe out slowly, letting your stomach muscles relax. Self-talk. Learn to notice and spot thought patterns that lead to anxiety reactions. Change those patterns to thoughts that feel peaceful. Muscle relaxation. Take time to tense muscles and then relax them. Choose a tension-reduction technique that fits your lifestyle and personality. These techniques take time and practice. Set aside 5-15 minutes a day to do them. Specialized therapists can offer counseling and training in these techniques. The training to help with anxiety may be covered by some insurance plans. Other things you can do to manage stress and anxiety include: Keeping a stress diary. This can help you learn what triggers your reaction and then learn ways to manage your response. Thinking about how you react to certain situations. You may not be able to control everything, but you can control your response. Making time for activities that help you relax and not feeling guilty about spending your time in this way. Doing visual imagery. This involves imagining or creating mental pictures to help you relax. Practicing yoga. Through yoga poses, you can lower tension and relax.   Medicines Medicines for anxiety include: Antidepressant medicines. These are usually prescribed for long-term daily control. Anti-anxiety medicines. These may be added in severe cases,  especially when panic attacks occur. When used together, medicines, psychotherapy, and tension-reduction techniques may be the most effective treatment. Relationships Relationships can play a big part in helping you recover. Spend more time connecting with trusted friends and family members. Think about going to couples counseling if you have a partner, taking family education classes, or going to family therapy. Therapy can help you and others better understand your anxiety. How to recognize changes in your anxiety Everyone responds differently to treatment for anxiety. Recovery from anxiety happens when symptoms lessen and stop interfering with your daily life at home or work. This may mean that you will start to: Have  better concentration and focus. Worry will interfere less in your daily thinking. Sleep better. Be less irritable. Have more energy. Have improved memory. Try to recognize when your condition is getting worse. Contact your provider if your symptoms interfere with home or work and you feel like your condition is not improving. Follow these instructions at home: Activity Exercise. Adults should: Exercise for at least 150 minutes each week. The exercise should increase your heart rate and make you sweat (moderate-intensity exercise). Do strengthening exercises at least twice a week. Get the right amount and quality of sleep. Most adults need 7-9 hours of sleep each night. Lifestyle  Eat a healthy diet that includes plenty of vegetables, fruits, whole grains, low-fat dairy products, and lean protein. Do not eat a lot of foods that are high in fats, added sugars, or salt (sodium). Make choices that simplify your life. Do not use any products that contain nicotine or tobacco. These products include cigarettes, chewing tobacco, and vaping devices, such as e-cigarettes. If you need help quitting, ask your provider. Avoid caffeine , alcohol, and certain over-the-counter cold medicines.  These may make you feel worse. Ask your pharmacist which medicines to avoid. General instructions Take over-the-counter and prescription medicines only as told by your provider. Keep all follow-up visits. This is to make sure you are managing your anxiety well or if you need more support. Where to find support You can get help and support from: Self-help groups. Online and [employer]. A trusted spiritual leader. Couples counseling. Family education classes. Family therapy. Where to find more information You may find that joining a support group helps you deal with your anxiety. The following sources can help you find counselors or support groups near you: Mental Health [place]: mentalhealthamerica.net Anxiety and Depression Association of [place] (ADAA): adaa.org The [employer] on Mental Illness (NAMI): nami.org Contact a health care provider if: You have a hard time staying focused or finishing tasks. You spend many hours a day feeling worried about everyday life. You are very tired because you cannot stop worrying. You start to have headaches or often feel tense. You have chronic nausea or diarrhea. Get help right away if: Your heart feels like it is racing. You have shortness of breath. You have thoughts of hurting yourself or others. Get help right away if you feel like you may hurt yourself or others, or have thoughts about taking your own life. Go to your nearest emergency room or: Call 911. Call the National Suicide Prevention Lifeline at (564)787-8070 or 988. This is open 24 hours a day. Text the Crisis Text Line at 303-328-1907. This information is not intended to replace advice given to you by your health care provider. Make sure you discuss any questions you have with your health care provider. Document Revised: 10/17/2021 Document Reviewed: 05/01/2020 Elsevier Patient Education  2024 Elsevier Inc. Mindfulness-Based Stress Reduction: What to  Know Mindfulness-based stress reduction (MBSR) is a mindfulness meditation program that normally takes place over 8 weeks. It usually includes weekly group classes and daily exercises to do at home. What are the benefits of MBSR? Mindfulness meditation therapies, like MBSR, can change a person's brain and body in good ways, and make them healthier. MBSR can have many benefits, such as: Helping to lower stress hormones. Decreasing symptoms or helping to deal with symptoms of different conditions, like: Anxiety, which is feeling worried or nervous. Long-lasting pain. This is pain that lasts more than 3 months. Stress and worry. Trouble sleeping. Headaches, like migraines and  tension headaches. Irritable bowel syndrome. Helping to handle stress from things you can't control, like: Long-term illnesses, especially if you have a lot of pain or other difficult symptoms. Big life events. Stress at work. Stress from taking care of someone else. Types of MBSR exercises Mindfulness. This is a common type of meditation. Meditation. It helps you focus your mind to feel calm and happy. It has two main parts: paying attention and accepting. Paying attention means focusing on what is happening right now. This usually means noticing your breathing, your thoughts, how your body feels, and your emotions. Accepting means noticing these feelings and sensations without judging them. Instead of reacting to these thoughts or feelings, you just observe them and let them pass. MSBR exercises include: Body scanning. This is a mindfulness exercise where you pay attention to how different parts of your body feel. You can do this while lying down or sitting up. Sitting meditations. In this exercise, you focus on something like your breathing. When your mind starts to wander, gently bring it back to your breath. Keep doing this every time you notice your mind wandering. Mindful movements. This exercise involves  moving and stretching your body slowly while paying attention to how it feels. Mindful Tasks. This means paying attention to how your body feels while doing things like walking or eating. Follow these instructions at home:  Find an in-person MBSR program or find a program that is online. Find a podcast or recording that provides guidance for MSBR exercises. Look for a therapist who knows how to use MBSR. Follow your treatment plan as told by your health care provider. This may include taking regular medicines and making changes to your diet or lifestyle. Where to find more information You can find more information about MBSR from: Your provider. Community-based meditation centers or programs. American Psychological Association at http://forbes-duran.com/. This information is not intended to replace advice given to you by your health care provider. Make sure you discuss any questions you have with your health care provider. Document Revised: 03/14/2023 Document Reviewed: 03/14/2023 Elsevier Patient Education  2025 [employer]. Managing Depression, Adult Depression is a mental health condition that affects your thoughts, feelings, and actions. Being diagnosed with depression can bring you relief if you did not know why you have felt or behaved a certain way. It could also leave you feeling overwhelmed. Finding ways to manage your symptoms can help you feel more positive about your future. How to manage lifestyle changes Being depressed is difficult. Depression can increase the level of everyday stress. Stress can make depression symptoms worse. You may believe your symptoms cannot be managed or will never improve. However, there are many things you can try to help manage your symptoms. There is hope. Managing stress  Stress is your body's reaction to life changes and events, both good and bad. Stress can add to your feelings of depression. Learning to manage your stress  can help lessen your feelings of depression. Try some of the following approaches to reducing your stress (stress reduction techniques): Listen to music that you enjoy and that inspires you. Try using a meditation app or take a meditation class. Develop a practice that helps you connect with your spiritual self. Walk in nature, pray, or go to a place of worship. Practice deep breathing. To do this, inhale slowly through your nose. Pause at the top of your inhale for a few seconds and then exhale slowly, letting yourself relax. Repeat this three or four times.  Practice yoga to help relax and work your muscles. Choose a stress reduction technique that works for you. These techniques take time and practice to develop. Set aside 5-15 minutes a day to do them. Therapists can offer training in these techniques. Do these things to help manage stress: Keep a journal. Know your limits. Set healthy boundaries for yourself and others, such as saying no when you think something is too much. Pay attention to how you react to certain situations. You may not be able to control everything, but you can change your reaction. Add humor to your life by watching funny movies or shows. Make time for activities that you enjoy and that relax you. Spend less time using electronics, especially at night before bed. The light from screens can make your brain think it is time to get up rather than go to bed.   Medicines Medicines, such as antidepressants, are often a part of treatment for depression. Talk with your pharmacist or health care provider about all the medicines, supplements, and herbal products that you take, their possible side effects, and what medicines and other products are safe to take together. Make sure to report any side effects you may have to your health care provider. Relationships Your health care provider may suggest family therapy, couples therapy, or individual therapy as part of your  treatment. How to recognize changes Everyone responds differently to treatment for depression. As you recover from depression, you may start to: Have more interest in doing activities. Feel more hopeful. Have more energy. Eat a more regular amount of food. Have better mental focus. It is important to recognize if your depression is not getting better or is getting worse. The symptoms you had in the beginning may return, such as: Feeling tired. Eating too much or too little. Sleeping too much or too little. Feeling restless, agitated, or hopeless. Trouble focusing or making decisions. Having unexplained aches and pains. Feeling irritable, angry, or aggressive. If you or your family members notice these symptoms coming back, let your health care provider know right away. Follow these instructions at home: Activity Try to get some form of exercise each day, such as walking. Try yoga, mindfulness, or other stress reduction techniques. Participate in group activities if you are able. Lifestyle Get enough sleep. Cut down on or stop using caffeine , tobacco, alcohol, and any other harmful substances. Eat a healthy diet that includes plenty of vegetables, fruits, whole grains, low-fat dairy products, and lean protein. Limit foods that are high in solid fats, added sugar, or salt (sodium). General instructions Take over-the-counter and prescription medicines only as told by your health care provider. Keep all follow-up visits. It is important for your health care provider to check on your mood, behavior, and medicines. Your health care provider may need to make changes to your treatment. Where to find support Talking to others  Friends and family members can be sources of support and guidance. Talk to trusted friends or family members about your condition. Explain your symptoms and let them know that you are working with a health care provider to treat your depression. Tell friends and family  how they can help. Finances Find mental health providers that fit with your financial situation. Talk with your health care provider if you are worried about access to food, housing, or medicine. Call your insurance company to learn about your co-pays and prescription plan. Where to find more information You can find support in your area from: Anxiety and Depression Association  of [place] (ADAA): adaa.org Mental Health [place]: mentalhealthamerica.net [employer] on Mental Illness: nami.org Contact a health care provider if: You stop taking your antidepressant medicines, and you have any of these symptoms: Nausea. Headache. Light-headedness. Chills and body aches. Not being able to sleep (insomnia). You or your friends and family think your depression is getting worse. Get help right away if: You have thoughts of hurting yourself or others. Get help right away if you feel like you may hurt yourself or others, or have thoughts about taking your own life. Go to your nearest emergency room or: Call 911. Call the National Suicide Prevention Lifeline at 651-602-1583 or 988. This is open 24 hours a day. Text the Crisis Text Line at 430-753-6073. This information is not intended to replace advice given to you by your health care provider. Make sure you discuss any questions you have with your health care provider. Document Revised: 05/16/2021 Document Reviewed: 05/16/2021 Elsevier Patient Education  2024 Elsevier Inc. Preventing Vitamin D  Deficiency Vitamin D  deficiency is when your body doesn't have enough vitamin D . Vitamin D  is important because it helps your body maintain calcium and phosphorus levels. Vitamin D  is also important because: It helps keep bones and teeth healthy. It reduces irritation and swelling in the body (inflammation). It makes the body's defense system (immune system) strong. Our bodies make vitamin D  when our skin is exposed to direct sunlight. But for many  people, this may not be enough vitamin D  to meet the body's needs. How can vitamin D  deficiency affect me? If lack of vitamin D  is really bad: Your bones may become soft. In adults, this is called osteomalacia. In children, this is called rickets. Your bones may become weak or thin. This is called osteoporosis. What can increase my risk for vitamin D  deficiency? You may be at risk for vitamin D  deficiency if: You're pregnant. You have too much body fat (obesity). You're an older adult. You have dark skin. You take medicines that affect the way vitamin D  is absorbed. You had surgery to remove a part of the stomach or a part of the small intestine. You may also be at risk if: You have a condition that makes it hard for you to absorb fat. This includes Crohn's disease, long-term (chronic) pancreatitis, or cystic fibrosis. You have certain conditions that are passed from parent to child (inherited). You don't have access to foods rich in vitamin D , or you follow a diet with little or no dairy, such as a vegan diet or lactose-free diet. You're not able to move or go outside safely. You live in areas that have fewer hours of sunlight. You spend most of your day indoors, or you cover your skin all the time when you're outdoors. What actions can I take to reduce my risk of vitamin D  deficiency? Know how much vitamin D  you need Different people need different amounts of vitamin D  daily: Infants: 400 international units (IU). Children older than 1 year: 600 IU. Adults: 600 IU. Pregnant and breastfeeding women: 600 IU. Adults older than 70 years: 800 IU. These are the minimum levels of recommended amounts. Your health care provider may tell you to take a different amount of vitamin D  based on your needs and your health. Know the best sources of vitamin D  You can meet your daily vitamin D  needs from: Direct exposure to natural sunlight. Foods. Dietary supplements. Infants can get their vitamin  D from infant formula. Get sun exposure Get regular, safe  exposure to natural sunlight. Expose your skin to direct sunlight for at least 15 minutes every day. If you have dark skin, you may need to expose your skin for a longer period of time. Protect your skin from too much sun exposure. This helps to prevent skin cancer. Ask your provider if regular sun exposure is safe for you. Do not use a tanning bed. Follow the right diet  Eat foods that naturally contain vitamin D . These include: Beef liver. Eggs. Vitamin D  is in the yolk. Fish, such as salmon or trout. Mushrooms that were treated with UV light. Eat or drink products that have vitamin D  added to them (fortified). These may include: Cereals. Milk, including plant-based milk such as almond, soy, or oat milks. Orange juice. Margarine. When choosing foods, check the food label on the package to see: How much vitamin D  is in the item. If the food is fortified with vitamin D . The items listed above may not be all the foods and drinks that have vitamin D . Talk with an expert in healthy eating (dietitian) to learn more. Take supplements and medicines If you're at risk for vitamin D  deficiency, or if you have certain diseases, your provider may recommend that you take a vitamin D  supplement. Make sure you: Talk with your provider before you start taking any vitamin D  supplements. You may have side effects of vitamin D  supplements if you're on certain medicines or have certain medical conditions. Take your medicines and supplments only as told. Tell your provider about all the medicines you're taking. These include vitamins, herbs, eye drops, creams, and over-the-counter medicines. To help your body to absorb vitamin D , take your supplement with a meal or snack. This information is not intended to replace advice given to you by your health care provider. Make sure you discuss any questions you have with your health care provider. Document  Revised: 04/25/2023 Document Reviewed: 04/25/2023 Elsevier Patient Education  2025 [employer]. Vitamin D  Deficiency: What to Know Vitamin D  deficiency is when your body doesn't have enough vitamin D . Vitamin D  is important because: It helps the body to maintain calcium and phosphorus levels. It helps to keep your bones healthy. Not getting enough vitamin D  can make your bones soft. It reduces irritation or inflammation in the body. It helps the body's defense system (immune system) work better. What are the causes? Not eating enough foods that have vitamin D  in them. Not getting enough sun. Having diseases that make it hard for your body to take in vitamin D . Having had part of your stomach or part of your small intestine taken out. What increases the risk? Being an older adult. Not spending much time outdoors. Living in a long-term care center. Having dark skin. Taking certain medicines, such as steroids or seizure medicines. Being overweight or very overweight (obese). Having long-term (chronic) kidney or liver disease. What are the signs or symptoms? Pain in the bones. Pain in the muscles. Not being able to walk normally. Bones that break easily. Joint pain. How is this diagnosed? This condition may be diagnosed with blood tests. Imaging tests such as X-rays may also be done to look for weakness in the bone. How is this treated? Treatment may include taking supplements. You may be told to take vitamin D  or calcium.  Your health care provider will tell you what dose is best for you. Follow these instructions at home: Eating and drinking Eat foods that contain vitamin D , such as: Dairy products, cereals,  or juices that have vitamin D  added to them. Check the label on the package to see if vitamin D  was added to your food. Fish, such as salmon or trout. Eggs. The vitamin D  is in the yolk. Mushrooms that were treated with UV light. Beef liver. The items listed above may not  be all the foods and drinks that have vitamin D . Talk with a dietitian to learn more. General instructions Take your medicines only as told. Take supplements only as told. Get regular, safe exposure to natural sunlight. Do not use a tanning bed. Maintain a healthy weight. Lose weight if needed. Contact a health care provider if: Your symptoms do not go away. You throw up or feel like you may throw up. You have trouble pooping (constipation), or you poop less than usual. This information is not intended to replace advice given to you by your health care provider. Make sure you discuss any questions you have with your health care provider. Document Revised: 05/27/2023 Document Reviewed: 03/29/2023 Elsevier Patient Education  2025 [employer].  The above assessment and management plan was discussed with the patient. The patient verbalized understanding of and has agreed to the management plan. Patient is aware to call the clinic if they develop any new symptoms or if symptoms persist or worsen. Patient is aware when to return to the clinic for a follow-up visit. Patient educated on when it is appropriate to go to the emergency department.    Kristi Brophy [place] Thompson, DNP [facility] [address] [place], [place] [address] [phone]

## 2023-11-15 LAB — THYROID PANEL WITH TSH
Free Thyroxine Index: 2.1 (ref 1.2–4.9)
T3 Uptake Ratio: 30 % (ref 24–39)
T4, Total: 7 ug/dL (ref 4.5–12.0)
TSH: 0.414 u[IU]/mL — ABNORMAL LOW (ref 0.450–4.500)

## 2023-11-15 LAB — CBC WITH DIFFERENTIAL/PLATELET
Basophils Absolute: 0 x10E3/uL (ref 0.0–0.2)
Basos: 1 %
EOS (ABSOLUTE): 0.1 x10E3/uL (ref 0.0–0.4)
Eos: 2 %
Hematocrit: 40.3 % (ref 34.0–46.6)
Hemoglobin: 13.2 g/dL (ref 11.1–15.9)
Immature Grans (Abs): 0 x10E3/uL (ref 0.0–0.1)
Immature Granulocytes: 0 %
Lymphocytes Absolute: 2.8 x10E3/uL (ref 0.7–3.1)
Lymphs: 44 %
MCH: 30.6 pg (ref 26.6–33.0)
MCHC: 32.8 g/dL (ref 31.5–35.7)
MCV: 94 fL (ref 79–97)
Monocytes Absolute: 0.5 x10E3/uL (ref 0.1–0.9)
Monocytes: 8 %
Neutrophils Absolute: 2.9 x10E3/uL (ref 1.4–7.0)
Neutrophils: 45 %
Platelets: 261 x10E3/uL (ref 150–450)
RBC: 4.31 x10E6/uL (ref 3.77–5.28)
RDW: 12.7 % (ref 11.7–15.4)
WBC: 6.3 x10E3/uL (ref 3.4–10.8)

## 2023-11-15 LAB — COMPREHENSIVE METABOLIC PANEL WITH GFR
ALT: 18 IU/L (ref 0–32)
AST: 14 IU/L (ref 0–40)
Albumin: 4.1 g/dL (ref 3.9–4.9)
Alkaline Phosphatase: 99 IU/L (ref 41–116)
BUN/Creatinine Ratio: 15 (ref 9–23)
BUN: 11 mg/dL (ref 6–20)
Bilirubin Total: 0.7 mg/dL (ref 0.0–1.2)
CO2: 23 mmol/L (ref 20–29)
Calcium: 9.3 mg/dL (ref 8.7–10.2)
Chloride: 105 mmol/L (ref 96–106)
Creatinine, Ser: 0.71 mg/dL (ref 0.57–1.00)
Globulin, Total: 2.7 g/dL (ref 1.5–4.5)
Glucose: 80 mg/dL (ref 70–99)
Potassium: 4.4 mmol/L (ref 3.5–5.2)
Sodium: 141 mmol/L (ref 134–144)
Total Protein: 6.8 g/dL (ref 6.0–8.5)
eGFR: 116 mL/min/1.73 (ref >59)

## 2023-11-15 LAB — LIPID PANEL
Chol/HDL Ratio: 3.2 ratio (ref 0.0–4.4)
Cholesterol, Total: 195 mg/dL (ref 100–199)
HDL: 61 mg/dL (ref >39)
LDL Chol Calc (NIH): 122 mg/dL — ABNORMAL HIGH (ref 0–99)
Triglycerides: 63 mg/dL (ref 0–149)
VLDL Cholesterol Cal: 12 mg/dL (ref 5–40)

## 2023-11-15 LAB — VITAMIN D 25 HYDROXY (VIT D DEFICIENCY, FRACTURES): Vit D, 25-Hydroxy: 22.3 ng/mL — ABNORMAL LOW (ref 30.0–100.0)

## 2023-11-18 ENCOUNTER — Ambulatory Visit: Payer: Self-pay | Admitting: Nurse Practitioner

## 2023-11-18 ENCOUNTER — Other Ambulatory Visit: Payer: Self-pay | Admitting: Nurse Practitioner

## 2023-11-18 DIAGNOSIS — E559 Vitamin D deficiency, unspecified: Secondary | ICD-10-CM

## 2023-11-18 DIAGNOSIS — R946 Abnormal results of thyroid function studies: Secondary | ICD-10-CM | POA: Insufficient documentation

## 2023-11-18 MED ORDER — VITAMIN D (CHOLECALCIFEROL) 50 MCG (2000 UT) PO CAPS: 1.0000 | ORAL_CAPSULE | Freq: Every day | ORAL | 0 refills | Status: DC

## 2023-12-10 NOTE — Progress Notes (Deleted)
 Subjective:  Patient ID: Kristi West, female    DOB: 04-Jul-1990, 33 y.o.   MRN: [id number]  Patient Care Team: Deitra Morton Hummer, Nena, NP as PCP - General (Nurse Practitioner) Tobb, Kardie, DO as PCP - Cardiology (Cardiology)   Chief Complaint:  No chief complaint on file.   HPI: Kristi West is a 33 y.o. female presenting on 12/12/2023 for No chief complaint on file.   Discussed the use of AI scribe software for clinical note transcription with the patient, who gave verbal consent to proceed.  History of Present Illness       Relevant past medical, surgical, family, and social history reviewed and updated as indicated.  Allergies and medications reviewed and updated. Data reviewed: Chart in Epic.   Past Medical History:  Diagnosis Date   A-fib (HCC)    Abnormal Pap smear    Albinism    Anemia    Anxiety    Atrial fibrillation, unspecified    Heart murmur    Pregnant 07/07/2012   Seizures (HCC)    x1 as a child   Vaginal Pap smear, abnormal     Past Surgical History:  Procedure Laterality Date   TONSILLECTOMY     WISDOM TOOTH EXTRACTION      Social History   Socioeconomic History   Marital status: Married    Spouse name: Not on file   Number of children: 2   Years of education: Not on file   Highest education level: Not on file  Occupational History   Not on file  Tobacco Use   Smoking status: Former    Types: Cigarettes   Smokeless tobacco: Never  Vaping Use   Vaping status: Never Used  Substance and Sexual Activity   Alcohol use: Not Currently    Comment: occa   Drug use: No   Sexual activity: Yes    Birth control/protection: I.U.D.    Comment: mirena   Other Topics Concern   Not on file  Social History Narrative   Not on file   Social Drivers of Health   Financial Resource Strain: Low Risk  (11/14/2023)   Overall Financial Resource Strain (CARDIA)    Difficulty of Paying Living Expenses: Not hard at all  Food Insecurity: No  Food Insecurity (11/14/2023)   Hunger Vital Sign    Worried About Running Out of Food in the Last Year: Never true    Ran Out of Food in the Last Year: Never true  Transportation Needs: No Transportation Needs (11/14/2023)   PRAPARE - Administrator, Civil Service (Medical): No    Lack of Transportation (Non-Medical): No  Physical Activity: Inactive (11/14/2023)   Exercise Vital Sign    Days of Exercise per Week: 0 days    Minutes of Exercise per Session: 0 min  Stress: No Stress Concern Present (10/18/2021)   [employer] of Occupational Health - Occupational Stress Questionnaire    Feeling of Stress : Only a little  Social Connections: Moderately Integrated (10/18/2021)   Social Connection and Isolation Panel    Frequency of Communication with Friends and Family: More than three times a week    Frequency of Social Gatherings with Friends and Family: Once a week    Attends Religious Services: More than 4 times per year    Active Member of [employer] or Organizations: Yes    Attends Banker Meetings: More than 4 times per year    Marital Status: Separated  Intimate Partner Violence: Not At Risk (11/14/2023)   Humiliation, Afraid, Rape, and Kick questionnaire    Fear of Current or Ex-Partner: No    Emotionally Abused: No    Physically Abused: No    Sexually Abused: No    Outpatient Encounter Medications as of 12/12/2023  Medication Sig   Cholecalciferol (VITAMIN D3) 50 MCG (2000 UT) CAPS Take 1 capsule (2,000 Units total) by mouth daily.   escitalopram  (LEXAPRO ) 5 MG tablet Take 1 tablet (5 mg total) by mouth daily.   hydrOXYzine (VISTARIL) 25 MG capsule Take 1 capsule (25 mg total) by mouth every 8 (eight) hours as needed.   levonorgestrel  (MIRENA ) 20 MCG/DAY IUD 1 each by Intrauterine route once. (Patient not taking: Reported on 11/14/2023)   No facility-administered encounter medications on file as of 12/12/2023.    Allergies  Allergen Reactions    Ceclor [Cefaclor] Hives   Other Itching and Swelling    Fresh fruits cause the patient's mouth and throat to itch and swell.   Peanut-Containing Drug Products Itching and Swelling    All types of nuts cause the patient's mouth and throat to swell and itch.    Pertinent ROS per HPI, otherwise unremarkable      Objective:  LMP 11/08/2023 (Exact Date)    Wt Readings from Last 3 Encounters:  11/14/23 293 lb 12.8 oz (133.3 kg)  11/29/21 251 lb (113.9 kg)  10/27/21 256 lb (116.1 kg)    Physical Exam Physical Exam      Results for orders placed or performed in visit on 11/14/23  CBC with Differential/Platelet   Collection Time: 11/14/23 11:44 AM  Result Value Ref Range   WBC 6.3 3.4 - 10.8 x10E3/uL   RBC 4.31 3.77 - 5.28 x10E6/uL   Hemoglobin 13.2 11.1 - 15.9 g/dL   Hematocrit 59.6 65.9 - 46.6 %   MCV 94 79 - 97 fL   MCH 30.6 26.6 - 33.0 pg   MCHC 32.8 31.5 - 35.7 g/dL   RDW 87.2 88.2 - 84.5 %   Platelets 261 150 - 450 x10E3/uL   Neutrophils 45 Not Estab. %   Lymphs 44 Not Estab. %   Monocytes 8 Not Estab. %   Eos 2 Not Estab. %   Basos 1 Not Estab. %   Neutrophils Absolute 2.9 1.4 - 7.0 x10E3/uL   Lymphocytes Absolute 2.8 0.7 - 3.1 x10E3/uL   Monocytes Absolute 0.5 0.1 - 0.9 x10E3/uL   EOS (ABSOLUTE) 0.1 0.0 - 0.4 x10E3/uL   Basophils Absolute 0.0 0.0 - 0.2 x10E3/uL   Immature Granulocytes 0 Not Estab. %   Immature Grans (Abs) 0.0 0.0 - 0.1 x10E3/uL  Comprehensive metabolic panel with GFR   Collection Time: 11/14/23 11:44 AM  Result Value Ref Range   Glucose 80 70 - 99 mg/dL   BUN 11 6 - 20 mg/dL   Creatinine, Ser 9.28 0.57 - 1.00 mg/dL   eGFR 883 >40 fO/fpw/8.26   BUN/Creatinine Ratio 15 9 - 23   Sodium 141 134 - 144 mmol/L   Potassium 4.4 3.5 - 5.2 mmol/L   Chloride 105 96 - 106 mmol/L   CO2 23 20 - 29 mmol/L   Calcium 9.3 8.7 - 10.2 mg/dL   Total Protein 6.8 6.0 - 8.5 g/dL   Albumin 4.1 3.9 - 4.9 g/dL   Globulin, Total 2.7 1.5 - 4.5 g/dL   Bilirubin  Total 0.7 0.0 - 1.2 mg/dL   Alkaline Phosphatase 99 41 - 116 IU/L  AST 14 0 - 40 IU/L   ALT 18 0 - 32 IU/L  Thyroid  Panel With TSH   Collection Time: 11/14/23 11:44 AM  Result Value Ref Range   TSH 0.414 (L) 0.450 - 4.500 uIU/mL   T4, Total 7.0 4.5 - 12.0 ug/dL   T3 Uptake Ratio 30 24 - 39 %   Free Thyroxine Index 2.1 1.2 - 4.9  Lipid panel   Collection Time: 11/14/23 11:44 AM  Result Value Ref Range   Cholesterol, Total 195 100 - 199 mg/dL   Triglycerides 63 0 - 149 mg/dL   HDL 61 >60 mg/dL   VLDL Cholesterol Cal 12 5 - 40 mg/dL   LDL Chol Calc (NIH) 877 (H) 0 - 99 mg/dL   Chol/HDL Ratio 3.2 0.0 - 4.4 ratio  VITAMIN D  25 Hydroxy (Vit-D Deficiency, Fractures)   Collection Time: 11/14/23 11:44 AM  Result Value Ref Range   Vit D, 25-Hydroxy 22.3 (L) 30.0 - 100.0 ng/mL  Bayer DCA Hb A1c Waived   Collection Time: 11/14/23 11:44 AM  Result Value Ref Range   HB A1C (BAYER DCA - WAIVED) 5.1 4.8 - 5.6 %       Pertinent labs & imaging results that were available during my care of the patient were reviewed by me and considered in my medical decision making.  Assessment & Plan:  There are no diagnoses linked to this encounter.   Assessment and Plan Assessment & Plan       Continue all other maintenance medications.  Follow up plan: No follow-ups on file.   Continue healthy lifestyle choices, including diet (rich in fruits, vegetables, and lean proteins, and low in salt and simple carbohydrates) and exercise (at least 30 minutes of moderate physical activity daily).  Educational handout given for ***  The above assessment and management plan was discussed with the patient. The patient verbalized understanding of and has agreed to the management plan. Patient is aware to call the clinic if they develop any new symptoms or if symptoms persist or worsen. Patient is aware when to return to the clinic for a follow-up visit. Patient educated on when it is appropriate to go to the  emergency department.   Aftan Vint [place] Thompson, DNP [facility] [address] [place], [place] [address] [phone]

## 2023-12-12 ENCOUNTER — Ambulatory Visit: Payer: Self-pay | Admitting: Nurse Practitioner

## 2023-12-12 ENCOUNTER — Encounter: Payer: Self-pay | Admitting: Nurse Practitioner

## 2023-12-12 ENCOUNTER — Telehealth: Admitting: Nurse Practitioner

## 2023-12-12 DIAGNOSIS — F411 Generalized anxiety disorder: Secondary | ICD-10-CM

## 2023-12-12 DIAGNOSIS — E559 Vitamin D deficiency, unspecified: Secondary | ICD-10-CM

## 2023-12-12 DIAGNOSIS — F330 Major depressive disorder, recurrent, mild: Secondary | ICD-10-CM

## 2023-12-12 MED ORDER — ESCITALOPRAM OXALATE 10 MG PO TABS: 10.0000 mg | ORAL_TABLET | Freq: Every day | ORAL | 0 refills | Status: AC

## 2023-12-12 NOTE — Progress Notes (Signed)
 Virtual Visit via video Note Due to COVID-19 pandemic this visit was conducted virtually. This visit type was conducted due to national recommendations for restrictions regarding the COVID-19 Pandemic (e.g. social distancing, sheltering in place) in an effort to limit this patient's exposure and mitigate transmission in our community. All issues noted in this document were discussed and addressed.  A physical exam was not performed with this format.   I connected with Kristi West on 12/12/2023 at 1500 by name and date of birth and verified that I am speaking with the correct person using two identifiers. Kristi West is currently located at work and  during visit. The provider, Nena Deitra Morton Sebastian, DNP is located in their office at time of visit.  I discussed the limitations, risks, security and privacy concerns of performing an evaluation and management service by virtual visit and the availability of in person appointments. I also discussed with the patient that there may be a patient responsible charge related to this service. The patient expressed understanding and agreed to proceed.  Subjective:  Patient ID: Kristi West, female    DOB: 1990/05/04, 33 y.o.   MRN: [id number]  Chief Complaint:  Panic Attack, Anxiety (de), and Depression (Feel like dose of Lexapro  not helping)   HPI: Kristi West is a 33 y.o. female presenting on 12/12/2023 for Panic Attack, Anxiety (de), and Depression (Feel like dose of Lexapro  not helping)   A 33 year old female was seen today via virtual visit for follow-up of generalized anxiety disorder (GAD) and major depression (MDD). She was started on escitalopram  (Lexapro ) 5 mg daily and hydroxyzine (Vistaril) 25 mg three times daily as needed four weeks ago. She reports no significant mood or anxiety improvement since starting Lexapro  but notes no panic attacks since her last visit. She recently went on a cruise and reports no adverse reactions to  medication.     11/14/2023   11:09 AM 06/22/2021    9:19 AM 06/05/2021    8:50 AM  PHQ9 SCORE ONLY  PHQ-9 Total Score 7 6  6       Data saved with a previous flowsheet row definition       11/14/2023   11:10 AM 06/22/2021    9:20 AM 06/05/2021    8:54 AM 03/09/2021    9:57 AM  GAD 7 : Generalized Anxiety Score  Nervous, Anxious, on Edge 1 2 2 2   Control/stop worrying 1 2 1 1   Worry too much - different things 1 2 1 1   Trouble relaxing 0 1 0 1  Restless 0 0 0 0  Easily annoyed or irritable 2 1 1 1   Afraid - awful might happen 2 2 1 2   Total GAD 7 Score 7 10 6 8   Anxiety Difficulty Somewhat difficult        Relevant past medical, surgical, family, and social history reviewed and updated as indicated.  Allergies and medications reviewed and updated.   Past Medical History:  Diagnosis Date   A-fib [facility])    Abnormal Pap smear    Albinism    Anemia    Anxiety    Atrial fibrillation, unspecified    Heart murmur    Pregnant 07/07/2012   Seizures (HCC)    x1 as a child   Vaginal Pap smear, abnormal     Past Surgical History:  Procedure Laterality Date   TONSILLECTOMY     WISDOM TOOTH EXTRACTION      Social History  Socioeconomic History   Marital status: Married    Spouse name: Not on file   Number of children: 2   Years of education: Not on file   Highest education level: Not on file  Occupational History   Not on file  Tobacco Use   Smoking status: Former    Types: Cigarettes   Smokeless tobacco: Never  Vaping Use   Vaping status: Never Used  Substance and Sexual Activity   Alcohol use: Not Currently    Comment: occa   Drug use: No   Sexual activity: Yes    Birth control/protection: I.U.D.    Comment: mirena   Other Topics Concern   Not on file  Social History Narrative   Not on file   Social Drivers of Health   Financial Resource Strain: Low Risk  (11/14/2023)   Overall Financial Resource Strain (CARDIA)    Difficulty of Paying Living Expenses:  Not hard at all  Food Insecurity: No Food Insecurity (11/14/2023)   Hunger Vital Sign    Worried About Running Out of Food in the Last Year: Never true    Ran Out of Food in the Last Year: Never true  Transportation Needs: No Transportation Needs (11/14/2023)   PRAPARE - Administrator, Civil Service (Medical): No    Lack of Transportation (Non-Medical): No  Physical Activity: Inactive (11/14/2023)   Exercise Vital Sign    Days of Exercise per Week: 0 days    Minutes of Exercise per Session: 0 min  Stress: No Stress Concern Present (10/18/2021)   [employer] of Occupational Health - Occupational Stress Questionnaire    Feeling of Stress : Only a little  Social Connections: Moderately Integrated (10/18/2021)   Social Connection and Isolation Panel    Frequency of Communication with Friends and Family: More than three times a week    Frequency of Social Gatherings with Friends and Family: Once a week    Attends Religious Services: More than 4 times per year    Active Member of [employer] or Organizations: Yes    Attends Banker Meetings: More than 4 times per year    Marital Status: Separated  Intimate Partner Violence: Not At Risk (11/14/2023)   Humiliation, Afraid, Rape, and Kick questionnaire    Fear of Current or Ex-Partner: No    Emotionally Abused: No    Physically Abused: No    Sexually Abused: No    Outpatient Encounter Medications as of 12/12/2023  Medication Sig   escitalopram  (LEXAPRO ) 10 MG tablet Take 1 tablet (10 mg total) by mouth daily.   Cholecalciferol (VITAMIN D3) 50 MCG (2000 UT) CAPS Take 1 capsule (2,000 Units total) by mouth daily.   hydrOXYzine (VISTARIL) 25 MG capsule Take 1 capsule (25 mg total) by mouth every 8 (eight) hours as needed.   levonorgestrel  (MIRENA ) 20 MCG/DAY IUD 1 each by Intrauterine route once. (Patient not taking: Reported on 11/14/2023)   [DISCONTINUED] escitalopram  (LEXAPRO ) 5 MG tablet Take 1 tablet (5 mg total)  by mouth daily.   No facility-administered encounter medications on file as of 12/12/2023.    Allergies  Allergen Reactions   Ceclor [Cefaclor] Hives   Other Itching and Swelling    Fresh fruits cause the patient's mouth and throat to itch and swell.   Peanut-Containing Drug Products Itching and Swelling    All types of nuts cause the patient's mouth and throat to swell and itch.    Review of Systems  HENT:  Negative  for congestion and sore throat.   Respiratory:  Negative for chest tightness and shortness of breath.   Gastrointestinal:  Negative for diarrhea, nausea and vomiting.  Neurological:  Negative for dizziness and headaches.  Psychiatric/Behavioral:  Negative for agitation, hallucinations, sleep disturbance and suicidal ideas. The patient is nervous/anxious.          Observations/Objective: No vital signs or physical exam, this was a virtual health encounter.  Pt alert and oriented, answers all questions appropriately, and able to speak in full sentences.  Physical Exam Vitals and nursing note reviewed.  Constitutional:      General: She is not in acute distress. HENT:     Head: Normocephalic and atraumatic.     Nose: Nose normal.  Eyes:     Extraocular Movements: Extraocular movements intact.     Conjunctiva/sclera: Conjunctivae normal.     Pupils: Pupils are equal, round, and reactive to light.  Pulmonary:     Effort: Pulmonary effort is normal.  Neurological:     Mental Status: She is alert and oriented to person, place, and time.  Psychiatric:        Attention and Perception: Attention and perception normal.        Mood and Affect: Mood is depressed.        Speech: Speech normal.        Behavior: Behavior normal.        Thought Content: Thought content normal. Thought content does not include homicidal or suicidal ideation. Thought content does not include homicidal or suicidal plan.        Cognition and Memory: Cognition and memory normal.         Judgment: Judgment normal.      Assessment and Plan: Kristi West was seen today for panic attack, anxiety and depression.  Diagnoses and all orders for this visit:  Mild episode of recurrent major depressive disorder  GAD (generalized anxiety disorder)  Other orders -     escitalopram  (LEXAPRO ) 10 MG tablet; Take 1 tablet (10 mg total) by mouth daily.   Kristi West is a 28 African-American female seen today via telehealth for mental health, no acute distress  Assessment: -Generalized Anxiety Disorder -- partial response -Major Depressive Disorder -- partial response  Plan: -Increase escitalopram  (Lexapro ) to 10 mg daily. -Continue hydroxyzine (Vistaril) 25 mg TID as needed for acute anxiety. -Reinforce medication adherence and continue lifestyle modifications (sleep hygiene, routine, exercise).  Follow Up Instructions: Return in about 4 weeks (around 01/09/2024).    I discussed the assessment and treatment plan with the patient. The patient was provided an opportunity to ask questions and all were answered. The patient agreed with the plan and demonstrated an understanding of the instructions.   The patient was advised to call back or seek an in-person evaluation if the symptoms worsen or if the condition fails to improve as anticipated.  The above assessment and management plan was discussed with the patient. The patient verbalized understanding of and has agreed to the management plan. Patient is aware to call the clinic if they develop any new symptoms or if symptoms persist or worsen. Patient is aware when to return to the clinic for a follow-up visit. Patient educated on when it is appropriate to go to the emergency department.    I provided 15 minutes of time during this video encounter.   Reynold Mantell [place] Thompson, DNP [facility] [address] [place], [place] [address] [phone] 12/12/2023

## 2023-12-13 ENCOUNTER — Ambulatory Visit: Admitting: Family

## 2024-01-09 ENCOUNTER — Ambulatory Visit

## 2024-01-13 ENCOUNTER — Encounter: Payer: Self-pay | Admitting: Nurse Practitioner

## 2024-01-13 ENCOUNTER — Ambulatory Visit: Admitting: Nurse Practitioner

## 2024-01-13 VITALS — BP 135/88 | HR 63 | Temp 97.8°F | Ht 67.0 in | Wt 287.2 lb

## 2024-01-13 DIAGNOSIS — F330 Major depressive disorder, recurrent, mild: Secondary | ICD-10-CM | POA: Diagnosis not present

## 2024-01-13 DIAGNOSIS — F411 Generalized anxiety disorder: Secondary | ICD-10-CM

## 2024-01-13 DIAGNOSIS — E559 Vitamin D deficiency, unspecified: Secondary | ICD-10-CM | POA: Diagnosis not present

## 2024-01-13 MED ORDER — VITAMIN D (ERGOCALCIFEROL) 1.25 MG (50000 UNIT) PO CAPS: 50000.0000 [IU] | ORAL_CAPSULE | ORAL | 0 refills | Status: AC

## 2024-01-13 NOTE — Progress Notes (Signed)
 "    Subjective:  Patient ID: Kristi West, female    DOB: 1990-12-23, 33 y.o.   MRN: [id number]  Patient Care Team: Deitra Morton Hummer, Nena, NP as PCP - General (Nurse Practitioner) Tobb, Kardie, DO as PCP - Cardiology (Cardiology)   Chief Complaint:  Anxiety and Depression (4 week follow up - patient states that she has not been taking her medication on a regular basis so is not sure if it is working )   HPI: Kristi West is a 33 y.o. female presenting on 01/13/2024 for Anxiety and Depression (4 week follow up - patient states that she has not been taking her medication on a regular basis so is not sure if it is working )   Discussed the use of AI scribe software for clinical note transcription with the patient, who gave verbal consent to proceed.  History of Present Illness Kristi West is a 33 year old female with anxiety and depression who presents for medication management and follow-up.  She has been inconsistent with her medication regimen, particularly with Lexapro  10 mg daily, and has not been taking it regularly. She has not needed to take hydroxyzine  25 mg as needed for anxiety. She experiences persistent emotions daily, with the holiday season and work stress contributing to her emotional state. She is interested in counseling but is concerned about insurance coverage for therapy.  She reports a significant family loss and notes that her family struggles with mental health stigma, affecting their willingness to seek help. Her husband prefers natural remedies over medication.  She has not been taking her prescribed vitamin D  2000 IU daily due to a misunderstanding about the prescription. Her vitamin D  level is 22, below the normal range of 30-100.  She was referred to an endocrinologist for thyroid  evaluation due to a low TSH level, which has been decreasing over the past eleven years. Her TSH was 0.33 eleven years ago, 0.40 nine years ago, and is now 0.41, with normal TSH  levels being 0.45 to 4.5.  She reports decreased appetite and a feeling of her stomach not being normal over the past two weeks, unrelated to her menstrual cycle. She has lost six pounds since her last appointment.  She mentions job-related stress and the desire to find a better position. 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       01/13/2024    3:47 PM 11/14/2023   11:09 AM 06/22/2021    9:19 AM 06/05/2021    8:50 AM 03/09/2021    9:57 AM  Depression screen PHQ 2/9  Decreased Interest 2 1 1 1 1   Down, Depressed, Hopeless 2 1 1 1 1   PHQ - 2 Score 4 2 2 2 2   Altered sleeping 1 0 1 2 0  Tired, decreased energy 2 2 1 2 2   Change in appetite 3 2 1  0 1  Feeling bad or failure about yourself  2  0 0 1  Trouble concentrating 2 1 1  0 0  Moving slowly or fidgety/restless 0 0 0 0 0  Suicidal thoughts 0 0 0 0 0  PHQ-9 Score 14 7  6  6  6    Difficult doing work/chores Somewhat difficult Not difficult at all        Data saved with a previous flowsheet row definition       01/13/2024    3:47 PM 11/14/2023   11:10  AM 06/22/2021    9:20 AM 06/05/2021    8:54 AM  GAD 7 : Generalized Anxiety Score  Nervous, Anxious, on Edge 2 1 2 2   Control/stop worrying 1 1 2 1   Worry too much - different things 2 1 2 1   Trouble relaxing 1 0 1 0  Restless 0 0 0 0  Easily annoyed or irritable 1 2 1 1   Afraid - awful might happen 2 2 2 1   Total GAD 7 Score 9 7 10 6   Anxiety Difficulty Somewhat difficult Somewhat difficult        Relevant past medical, surgical, family, and social history reviewed and updated as indicated.  Allergies and medications reviewed and updated. Data reviewed: Chart in Epic.   Past Medical History:  Diagnosis Date   A-fib (HCC)    Abnormal Pap smear    Albinism    Anemia    Anxiety    Atrial fibrillation, unspecified    Heart murmur    Pregnant 07/07/2012   Seizures (HCC)    x1 as a child   Vaginal Pap  smear, abnormal     Past Surgical History:  Procedure Laterality Date   TONSILLECTOMY     WISDOM TOOTH EXTRACTION      Social History   Socioeconomic History   Marital status: Married    Spouse name: Not on file   Number of children: 2   Years of education: Not on file   Highest education level: Not on file  Occupational History   Not on file  Tobacco Use   Smoking status: Former    Types: Cigarettes   Smokeless tobacco: Never  Vaping Use   Vaping status: Never Used  Substance and Sexual Activity   Alcohol use: Not Currently    Comment: occa   Drug use: No   Sexual activity: Yes    Birth control/protection: I.U.D.    Comment: mirena   Other Topics Concern   Not on file  Social History Narrative   Not on file   Social Drivers of Health   Tobacco Use: Medium Risk (01/13/2024)   Patient History    Smoking Tobacco Use: Former    Smokeless Tobacco Use: Never    Passive Exposure: Not on file  Financial Resource Strain: Low Risk (11/14/2023)   Overall Financial Resource Strain (CARDIA)    Difficulty of Paying Living Expenses: Not hard at all  Food Insecurity: No Food Insecurity (11/14/2023)   Epic    Worried About Radiation Protection Practitioner of Food in the Last Year: Never true    Ran Out of Food in the Last Year: Never true  Transportation Needs: No Transportation Needs (11/14/2023)   Epic    Lack of Transportation (Medical): No    Lack of Transportation (Non-Medical): No  Physical Activity: Inactive (11/14/2023)   Exercise Vital Sign    Days of Exercise per Week: 0 days    Minutes of Exercise per Session: 0 min  Stress: No Stress Concern Present (10/18/2021)   [employer] of Occupational Health - Occupational Stress Questionnaire    Feeling of Stress : Only a little  Social Connections: Moderately Integrated (10/18/2021)   Social Connection and Isolation Panel    Frequency of Communication with Friends and Family: More than three times a week    Frequency of Social  Gatherings with Friends and Family: Once a week    Attends Religious Services: More than 4 times per year    Active Member of [employer] or Organizations:  Yes    Attends Club or Organization Meetings: More than 4 times per year    Marital Status: Separated  Intimate Partner Violence: Not At Risk (11/14/2023)   Epic    Fear of Current or Ex-Partner: No    Emotionally Abused: No    Physically Abused: No    Sexually Abused: No  Depression (PHQ2-9): High Risk (01/13/2024)   Depression (PHQ2-9)    PHQ-2 Score: 14  Alcohol Screen: Low Risk (11/14/2023)   Alcohol Screen    Last Alcohol Screening Score (AUDIT): 3  Housing: Unknown (11/14/2023)   Epic    Unable to Pay for Housing in the Last Year: No    Number of Times Moved in the Last Year: Not on file    Homeless in the Last Year: No  Utilities: Not At Risk (11/14/2023)   Epic    Threatened with loss of utilities: No  Health Literacy: Not on file    Outpatient Encounter Medications as of 01/13/2024  Medication Sig   escitalopram  (LEXAPRO ) 10 MG tablet Take 1 tablet (10 mg total) by mouth daily.   levonorgestrel  (MIRENA ) 20 MCG/DAY IUD 1 each by Intrauterine route once.   Vitamin D , Ergocalciferol , (DRISDOL ) 1.25 MG (50000 UNIT) CAPS capsule Take 1 capsule (50,000 Units total) by mouth every 7 (seven) days.   hydrOXYzine  (VISTARIL ) 25 MG capsule Take 1 capsule (25 mg total) by mouth every 8 (eight) hours as needed. (Patient not taking: Reported on 01/13/2024)   [DISCONTINUED] Cholecalciferol  (VITAMIN D3) 50 MCG (2000 UT) CAPS Take 1 capsule (2,000 Units total) by mouth daily. (Patient not taking: Reported on 01/13/2024)   No facility-administered encounter medications on file as of 01/13/2024.    Allergies[1]  Pertinent ROS per HPI, otherwise unremarkable      Objective:  BP 135/88   Pulse 63   Temp 97.8 F (36.6 C)   Ht 5' 7 (1.702 m)   Wt 287 lb 3.2 oz (130.3 kg)   SpO2 100%   BMI 44.98 kg/m    Wt Readings from Last 3  Encounters:  01/13/24 287 lb 3.2 oz (130.3 kg)  11/14/23 293 lb 12.8 oz (133.3 kg)  11/29/21 251 lb (113.9 kg)    Physical Exam Vitals reviewed.  Constitutional:      General: She is not in acute distress.    Appearance: She is obese.  HENT:     Head: Normocephalic and atraumatic.     Nose: Nose normal.     Mouth/Throat:     Mouth: Mucous membranes are moist.  Eyes:     General: No scleral icterus.    Extraocular Movements: Extraocular movements intact.     Conjunctiva/sclera: Conjunctivae normal.     Pupils: Pupils are equal, round, and reactive to light.  Neck:     Vascular: No carotid bruit.  Cardiovascular:     Heart sounds: Normal heart sounds.  Pulmonary:     Effort: Pulmonary effort is normal.     Breath sounds: Normal breath sounds.  Musculoskeletal:        General: Normal range of motion.     Cervical back: Normal range of motion and neck supple. No rigidity or tenderness.     Right lower leg: No edema.     Left lower leg: No edema.  Lymphadenopathy:     Cervical: No cervical adenopathy.  Skin:    General: Skin is warm and dry.  Neurological:     Mental Status: She is oriented to person, place,  and time.  Psychiatric:        Attention and Perception: Attention and perception normal.        Mood and Affect: Affect normal. Mood is anxious.        Speech: Speech normal.        Behavior: Behavior normal. Behavior is cooperative.        Thought Content: Thought content normal.        Cognition and Memory: Cognition and memory normal.        Judgment: Judgment normal.    Physical Exam      Results for orders placed or performed in visit on 11/14/23  CBC with Differential/Platelet   Collection Time: 11/14/23 11:44 AM  Result Value Ref Range   WBC 6.3 3.4 - 10.8 x10E3/uL   RBC 4.31 3.77 - 5.28 x10E6/uL   Hemoglobin 13.2 11.1 - 15.9 g/dL   Hematocrit 59.6 65.9 - 46.6 %   MCV 94 79 - 97 fL   MCH 30.6 26.6 - 33.0 pg   MCHC 32.8 31.5 - 35.7 g/dL   RDW  87.2 88.2 - 84.5 %   Platelets 261 150 - 450 x10E3/uL   Neutrophils 45 Not Estab. %   Lymphs 44 Not Estab. %   Monocytes 8 Not Estab. %   Eos 2 Not Estab. %   Basos 1 Not Estab. %   Neutrophils Absolute 2.9 1.4 - 7.0 x10E3/uL   Lymphocytes Absolute 2.8 0.7 - 3.1 x10E3/uL   Monocytes Absolute 0.5 0.1 - 0.9 x10E3/uL   EOS (ABSOLUTE) 0.1 0.0 - 0.4 x10E3/uL   Basophils Absolute 0.0 0.0 - 0.2 x10E3/uL   Immature Granulocytes 0 Not Estab. %   Immature Grans (Abs) 0.0 0.0 - 0.1 x10E3/uL  Comprehensive metabolic panel with GFR   Collection Time: 11/14/23 11:44 AM  Result Value Ref Range   Glucose 80 70 - 99 mg/dL   BUN 11 6 - 20 mg/dL   Creatinine, Ser 9.28 0.57 - 1.00 mg/dL   eGFR 883 >40 fO/fpw/8.26   BUN/Creatinine Ratio 15 9 - 23   Sodium 141 134 - 144 mmol/L   Potassium 4.4 3.5 - 5.2 mmol/L   Chloride 105 96 - 106 mmol/L   CO2 23 20 - 29 mmol/L   Calcium 9.3 8.7 - 10.2 mg/dL   Total Protein 6.8 6.0 - 8.5 g/dL   Albumin 4.1 3.9 - 4.9 g/dL   Globulin, Total 2.7 1.5 - 4.5 g/dL   Bilirubin Total 0.7 0.0 - 1.2 mg/dL   Alkaline Phosphatase 99 41 - 116 IU/L   AST 14 0 - 40 IU/L   ALT 18 0 - 32 IU/L  Thyroid  Panel With TSH   Collection Time: 11/14/23 11:44 AM  Result Value Ref Range   TSH 0.414 (L) 0.450 - 4.500 uIU/mL   T4, Total 7.0 4.5 - 12.0 ug/dL   T3 Uptake Ratio 30 24 - 39 %   Free Thyroxine Index 2.1 1.2 - 4.9  Lipid panel   Collection Time: 11/14/23 11:44 AM  Result Value Ref Range   Cholesterol, Total 195 100 - 199 mg/dL   Triglycerides 63 0 - 149 mg/dL   HDL 61 >60 mg/dL   VLDL Cholesterol Cal 12 5 - 40 mg/dL   LDL Chol Calc (NIH) 877 (H) 0 - 99 mg/dL   Chol/HDL Ratio 3.2 0.0 - 4.4 ratio  VITAMIN D  25 Hydroxy (Vit-D Deficiency, Fractures)   Collection Time: 11/14/23 11:44 AM  Result Value Ref Range  Vit D, 25-Hydroxy 22.3 (L) 30.0 - 100.0 ng/mL  Bayer DCA Hb A1c Waived   Collection Time: 11/14/23 11:44 AM  Result Value Ref Range   HB A1C (BAYER DCA - WAIVED)  5.1 4.8 - 5.6 %       Pertinent labs & imaging results that were available during my care of the patient were reviewed by me and considered in my medical decision making.  Assessment & Plan:  Kristi West was seen today for anxiety and depression.  Diagnoses and all orders for this visit:  Mild episode of recurrent major depressive disorder -     Vitamin D , Ergocalciferol , (DRISDOL ) 1.25 MG (50000 UNIT) CAPS capsule; Take 1 capsule (50,000 Units total) by mouth every 7 (seven) days. -     Ambulatory referral to Psychology  GAD (generalized anxiety disorder) -     Vitamin D , Ergocalciferol , (DRISDOL ) 1.25 MG (50000 UNIT) CAPS capsule; Take 1 capsule (50,000 Units total) by mouth every 7 (seven) days. -     Ambulatory referral to Psychology  Vitamin D  deficiency -     Vitamin D , Ergocalciferol , (DRISDOL ) 1.25 MG (50000 UNIT) CAPS capsule; Take 1 capsule (50,000 Units total) by mouth every 7 (seven) days.     Assessment and Plan Kristi West 33 year old African-American female seen today for chronic disease management, no acute distress Assessment & Plan Major depressive disorder, recurrent, mild Inconsistent medication adherence due to fear of long-term use and stigma. Interested in counseling but concerned about insurance coverage. Prefers consistent medication use before counseling. - Encouraged consistent use of Lexapro  10 mg daily. - Referred to counseling to explore therapy options and insurance coverage. - Scheduled follow-up in one month to assess medication adherence and effectiveness.  Generalized anxiety disorder Anxiety exacerbated by work stress and recent job loss. Discussed coping strategies and importance of therapy. - Encouraged consistent use of Lexapro  10 mg daily. - Referred to counseling for anxiety management and coping strategies.  Vitamin D  deficiency - Prescribed vitamin D  50,000 IU weekly. - Canceled previous prescription for 2000 IU daily.  Suspected  hyperthyroidism (low TSH) TSH levels decreasing over 11 years, currently at 0.41, suggesting possible hyperthyroidism. Referral to endocrinology needed to determine cause, possibly pituitary dysfunction. - Referred to endocrinology for further evaluation of thyroid  function.     Continue all other maintenance medications.  Follow up plan: Return in about 1 month (around 02/13/2024) for Mental Health.   Continue healthy lifestyle choices, including diet (rich in fruits, vegetables, and lean proteins, and low in salt and simple carbohydrates) and exercise (at least 30 minutes of moderate physical activity daily).  Educational handout given for   Clinical References  Depression with the Seasons (Seasonal Affective Disorder): What to Know Seasonal Affective Disorder (SAD) is a kind of depression. It makes you feel depressed or sad at certain times of the year. Even though SAD can sometimes happen in the spring or summer, it usually happens in late fall and winter when the days are shorter and people spend less time outside. This is why it's also called the winter blues. SAD can be mild or severe and it can make it hard to work, go to school, have good relationships, and do daily activities. What are the causes? The cause of SAD is not known. It might be because of changes in brain chemicals when there is more or less daylight. What increases the risk? You are more likely to develop SAD if: You are female. You live far away from the equator,  where there is less sunlight and longer winters. You have depression or bipolar disorder. Someone in your family has or has had mental health problems. What are the signs or symptoms? Symptoms of SAD include: Mood changes, such as: Feeling sad or crying a lot. Getting angry easier or faster than usual. Feeling guilty or worthless. Thinking about hurting yourself or attempting suicide. Physical changes, such as: Feeling restless or very  tired. Having trouble concentrating or making decisions. Having a big change in appetite or weight. Behavioral changes, such as: Having trouble sleeping or sleeping too much. Losing interest in things you usually enjoy. Eating too much or craving sweet foods. Avoiding people and wanting to be alone. Symptoms of summer SAD include: Not feeling hungry. Losing weight. Having trouble sleeping. Being very aggressive or violent, in severe cases. How is this diagnosed? A health care provider or mental health specialist may ask about your feelings, thoughts, and actions, and if you notice a pattern with the seasons. They may also ask about: Your medical history. If you have had any big changes in your life. Any medicines you take. Any substances you use. You might get a physical exam and blood tests to make sure nothing else is causing your symptoms, like thyroid  problems, low blood sugar, or an infection. How is this treated? Usually, SAD symptoms get better on their own when the seasons change, but treatment can help with feeling better faster, especially if the symptoms are really bad. Examples of treatments and things you can do to help with SAD symptoms include: Having light therapy or taking steps to be exposed to more light (for fall or winter SAD), like: Using a light box designed specifically to treat SAD. Using a dawn simulator or sunrise clock. This is a timer-activated light source that copies the sunrise by slowly becoming brighter. This can help to activate your body's internal clock. Making your home and workplace bright and sunny. Open the window blinds. Move your furniture closer to the windows to get more natural light. Trying to spend time outside if you can. But, remember to talk to your provider about the good and bad things about being in the sun. Doing cognitive behavioral therapy (CBT). CBT is a form of talk therapy that helps to identify and change negative thoughts that  are associated with SAD. Taking medicine, like an antidepressant. Doing things to stay healthy and feel good, such as: Exercise regularly, Eat healthy foods. Get enough sleep. Stay busy and connected with other people by helping others, joining group activities, and spending time with friends and family. Follow these instructions at home: Medicines Take your medicines only as told. If you take medicine for SAD, avoid using alcohol and other substances because they may prevent your medicine from working correctly. Talk with your pharmacist or provider about: All the medicines that you take. Possible side effects. What medicines are safe to take together,. Ask you provider about when you should expect for your symptoms to get better. Antidepressants take a while to start working. Find out which side effects or symptoms are so serious you should call the provider. Talk to your provider before you start taking any new prescription medicines, medicines you can buy at the store, herbs, or supplements. General instructions Get enough sleep. To improve your sleep, try: Keeping your bedroom dark and cool. Going to bed and waking up at the same time every day. Limiting your screen time starting a few hours before bedtime. Exercise regularly. Eat healthy foods  like fruits, vegetables, whole grains, and lean proteins. Keep all follow-up visits. Your provider will need to monitor your symptoms and treatment response. Where to find more information To learn more, go to these websites: American Psychological Association: tropicalcloud.ca American Psychiatric Association: psychiatry.[employer] of Mental Health: bloggercourse.com Then: Click the search box. Type Seasonal Affective Disorder in the search box and find the link you need. Contact a health care provider if: Your symptoms don't get better or they get worse. You have trouble taking care of yourself. You are using alcohol or other  substances to cope with your symptoms. You have side effects from medicines. Get help right away if: You feel like you may hurt yourself or others. You have thoughts about taking your own life. You have other thoughts or feelings that worry you. These symptoms may be an emergency. Take one of these steps right away: Go to your nearest emergency room. Call 911. Contact the 988 Suicide & Crisis Lifeline (24/7, free and confidential): Call or text 988. Chat online at chat.newsactor.se. For Veterans and their loved ones: Call 988 and press 1. Text the [employer] at 567-416-1535. Chat online at reservationslist.si. This information is not intended to replace advice given to you by your health care provider. Make sure you discuss any questions you have with your health care provider. Document Revised: 03/26/2023 Document Reviewed: 03/25/2023 Elsevier Patient Education  2025 Elsevier Inc. Vitamin D  Deficiency Vitamin D  deficiency is when your body does not have enough vitamin D . Vitamin D  is important because: It helps your body use certain minerals. It helps to keep your bones healthy. It lessens irritation and swelling (inflammation). It helps the body's defense system (immune system) work better. Not getting enough vitamin D  can make your bones soft. What are the causes? Not eating enough foods that have vitamin D  in them. Not getting enough sun. Having diseases that make it hard for your body to take in vitamin D . Having had part of your stomach or part of your small intestine taken out. What increases the risk? Being an older adult. Not spending much time outdoors. Living in a long-term care center. Having dark skin. Taking certain medicines. Being overweight or very overweight (obese). Having long-term (chronic) kidney or liver disease. What are the signs or symptoms? In mild cases, there may be no symptoms. If the condition is very bad, symptoms may include: Bone  pain. Muscle pain. Not being able to walk normally. Bones that break easily. Joint pain. How is this treated? Treatment may include taking supplements as told by your doctor. Your doctor will tell you what dose is best for you. This may include taking: Vitamin D . Calcium. Follow these instructions at home: Eating and drinking Eat foods that have vitamin D  in them, such as: Dairy products, cereals, or juices that have vitamin D  added to them (are fortified). Check the label. Fish, such as salmon or trout. Eggs. The vitamin D  is in the yolk. Mushrooms that were treated with UV light. Beef liver. The items listed above may not be a complete list of foods and beverages you can eat and drink. Contact a dietitian for more information. General instructions Take over-the-counter and prescription medicines only as told by your doctor. Take supplements only as told by your doctor. Get sunlight in a safe way. Do not use a tanning bed. Stay at a healthy weight. Lose weight if you need to. Keep all follow-up visits. How is this prevented? Eating foods that naturally  have vitamin D  in them. Eating or drinking foods and drinks that have vitamin D  added to them, such as cereals, juices, and milk. Taking vitamin D  or a multivitamin that has vitamin D  in it. Being in the sun. Your body makes vitamin D  when your skin gets sunlight. Contact a doctor if: Your symptoms do not go away. You feel like you may vomit (nauseous). You vomit. You poop less often than normal, or you have trouble pooping (constipation). Summary Vitamin D  deficiency is when your body does not have enough vitamin D . Vitamin D  helps to keep your bones healthy. This condition is often treated by taking a supplement. Your doctor will tell you what dose is best for you. This information is not intended to replace advice given to you by your health care provider. Make sure you discuss any questions you have with your health care  provider. Document Revised: 10/14/2020 Document Reviewed: 10/14/2020 Elsevier Patient Education  2024 Elsevier Inc.  The above assessment and management plan was discussed with the patient. The patient verbalized understanding of and has agreed to the management plan. Patient is aware to call the clinic if they develop any new symptoms or if symptoms persist or worsen. Patient is aware when to return to the clinic for a follow-up visit. Patient educated on when it is appropriate to go to the emergency department.   Tamsin Nader [place] Thompson, DNP [facility] [address] [place], [place] [address] [phone]       [1]  Allergies Allergen Reactions   Ceclor [Cefaclor] Hives   Other Itching and Swelling    Fresh fruits cause the patient's mouth and throat to itch and swell.   Peanut-Containing Drug Products Itching and Swelling    All types of nuts cause the patient's mouth and throat to swell and itch.   "

## 2024-02-03 ENCOUNTER — Encounter: Payer: Self-pay | Admitting: *Deleted

## 2024-02-11 ENCOUNTER — Ambulatory Visit: Admitting: Nurse Practitioner

## 2024-02-13 ENCOUNTER — Ambulatory Visit: Admitting: Nurse Practitioner

## 2024-02-26 ENCOUNTER — Telehealth: Payer: Self-pay | Admitting: Cardiology

## 2024-02-26 NOTE — Telephone Encounter (Signed)
 Call to patient to discuss concerns, 2 identifiers used. Patient was last seen in 2023. She had afib during pregnancy and did wear a heart monitor that showed symptomatic PVC's. Echo was normal. Patient was started on metoprolol .   Patient reports she has not taken metoprolol  in some time and had not had any symptoms until 2 weeks ago when she had larygitis with lots of coughing. She states that around that time she started having palpitations or a thump or skipped beat feeling a few times a day. She states these symptoms occurred at rest and with exertion.  It has become more frequent to where it is happening through out the day, occurring for just a few seconds at a time. She was recently started on vistaril  for anxiety and she says she took her first dose today, which did seem to decrease the frequency of the palpitations/thump or skipped  beat feeling. She does not track her HR or BP at home, and she denies any chest pain or SOB.   Patient is requesting to re-establish with Dr. Sheena, forwarded to Dr. Sheena for advice.

## 2024-02-26 NOTE — Telephone Encounter (Signed)
 Patient c/o Palpitations: STAT if patient c/o lightheadedness, shortness of breath, or chest pain  How long have you had palpitations/irregular HR/ Afib? Are you having the symptoms now? A week   Are you currently experiencing lightheadedness, SOB or CP? No   Do you have a history of afib (atrial fibrillation) or irregular heart rhythm? Yes   Have you checked your BP or HR? (document readings if available): No   Are you experiencing any other symptoms? No

## 2024-02-27 NOTE — Telephone Encounter (Signed)
 Left message to call back and [facility] message sent.

## 2024-03-03 ENCOUNTER — Ambulatory Visit: Admitting: Nurse Practitioner

## 2024-03-04 ENCOUNTER — Ambulatory Visit: Admitting: General Practice
